# Patient Record
Sex: Female | Born: 1985 | Race: Black or African American | Hispanic: No | Marital: Single | Smoking: Never smoker
Health system: Southern US, Community
[De-identification: ages and names within clinical notes are randomized; demographics above are authoritative.]

## PROBLEM LIST (undated history)

## (undated) DIAGNOSIS — U071 COVID-19: Secondary | ICD-10-CM

## (undated) DIAGNOSIS — G4733 Obstructive sleep apnea (adult) (pediatric): Secondary | ICD-10-CM

## (undated) DIAGNOSIS — I10 Essential (primary) hypertension: Secondary | ICD-10-CM

## (undated) DIAGNOSIS — G4730 Sleep apnea, unspecified: Secondary | ICD-10-CM

## (undated) HISTORY — DX: Obstructive sleep apnea (adult) (pediatric): G47.33

## (undated) HISTORY — DX: COVID-19: U07.1

---
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
## 2004-05-10 ENCOUNTER — Emergency Department (HOSPITAL_COMMUNITY): Admission: EM | Admit: 2004-05-10 | Discharge: 2004-05-10 | Payer: Self-pay | Admitting: Emergency Medicine

## 2004-08-23 ENCOUNTER — Emergency Department (HOSPITAL_COMMUNITY): Admission: EM | Admit: 2004-08-23 | Discharge: 2004-08-23 | Payer: Self-pay | Admitting: Emergency Medicine

## 2005-02-06 ENCOUNTER — Emergency Department (HOSPITAL_COMMUNITY): Admission: EM | Admit: 2005-02-06 | Discharge: 2005-02-06 | Payer: Self-pay | Admitting: Family Medicine

## 2010-11-20 ENCOUNTER — Ambulatory Visit: Payer: Self-pay | Admitting: Gynecology

## 2010-12-03 ENCOUNTER — Other Ambulatory Visit: Payer: Self-pay | Admitting: Gynecology

## 2010-12-03 ENCOUNTER — Other Ambulatory Visit (HOSPITAL_COMMUNITY)
Admission: RE | Admit: 2010-12-03 | Discharge: 2010-12-03 | Disposition: A | Discharge status: Home / Self Care | Payer: 59 | Source: Ambulatory Visit | Attending: Gynecology | Admitting: Gynecology

## 2010-12-03 ENCOUNTER — Encounter: Payer: Self-pay | Admitting: Anesthesiology

## 2010-12-03 ENCOUNTER — Ambulatory Visit (INDEPENDENT_AMBULATORY_CARE_PROVIDER_SITE_OTHER): Payer: 59 | Admitting: Gynecology

## 2010-12-03 VITALS — BP 124/76 | Ht 63.5 in | Wt 226.0 lb

## 2010-12-03 DIAGNOSIS — Z01419 Encounter for gynecological examination (general) (routine) without abnormal findings: Secondary | ICD-10-CM

## 2010-12-03 DIAGNOSIS — Z13220 Encounter for screening for lipoid disorders: Secondary | ICD-10-CM

## 2010-12-03 DIAGNOSIS — Z833 Family history of diabetes mellitus: Secondary | ICD-10-CM

## 2010-12-03 DIAGNOSIS — E669 Obesity, unspecified: Secondary | ICD-10-CM

## 2010-12-03 DIAGNOSIS — R635 Abnormal weight gain: Secondary | ICD-10-CM

## 2010-12-03 DIAGNOSIS — R82998 Other abnormal findings in urine: Secondary | ICD-10-CM

## 2010-12-03 MED ORDER — PHENTERMINE HCL 37.5 MG PO CAPS: 37.5000 mg | ORAL_CAPSULE | ORAL | Status: DC

## 2010-12-03 MED ORDER — NORETHIN ACE-ETH ESTRAD-FE 1-20 MG-MCG PO TABS: 1.0000 | ORAL_TABLET | Freq: Every day | ORAL | Status: DC

## 2010-12-03 NOTE — Patient Instructions (Signed)
Dietary therapy for obesity  All topics are updated as new evidence becomes available and our peer review process is complete.  Literature review current through: Jun 2012.  This topic last updated: Apr 06, 2010.  INTRODUCTION - The optimal management of overweight and obesity requires a combination of diet, exercise, and behavioral modification. In addition, some patients eventually require pharmacologic therapy or bariatric surgery. The risk of overweight to the subject should be evaluated before beginning any treatment program. Selection of treatment can then be made using a risk-benefit assessment). The choice of therapy is dependent on several factors including the degree of overweight or obesity and patient preference.  This topic will review the dietary therapy of obesity. Other aspects of treatment are discussed separately. (See "Health hazards associated with obesity in adults" and "Overview of therapy for obesity in adults" and "Drug therapy of obesity" and "Behavioral strategies in the treatment of obesity".) GOALS OF WEIGHT LOSS - It is important to set goals when discussing a dietary weight loss program with an individual patient. An initial weight loss goal of 5 to 7 percent of body weight is realistic for most individuals. The first goal for any overweight individual is to prevent further weight gain and keep body weight stable (within 5 pounds of its current level).  The goal of the clinician is to identify and review with the patient a realistic weight-loss goal. Most patients have a weight loss goal of 30 percent or more below current weight, which is unrealistic [1].  A successful program will lead to a weight loss of more than 5 percent of initial weight [2]. A weight loss of more than 5 percent can reduce risk factors for cardiovascular disease, such as dyslipidemia, hypertension, and diabetes mellitus [3]. In the Diabetes Prevention Program, a multi-center trial in patients with  impaired glucose tolerance, weight loss of 7 percent reduced the rate of progression from impaired glucose tolerance to diabetes by 58 percent [4]. (See "Prediction and prevention of type 2 diabetes mellitus", section on 'Diabetes Prevention Program'.)  Loss of 5 percent of initial body weight and maintenance of this loss is a good medical result, even if the subject does not reach his or her "dream" weight.  Although an extremely difficult goal to achieve, a body mass index (BMI) between 20 and 25 kg/m2 puts the subject in the lowest risk category (table 1 and figure 1). DIETARY ENERGY Rate of weight loss - The rate of weight loss is directly related to the difference between the subject's energy intake and energy requirements. Reducing caloric intake below expenditure results in a predictable initial rate of weight loss that is related to the energy deficit [5,6]. However, prediction of weight loss for an individual subject can be difficult because of marked intersubject variability in initial body composition, adherence, and energy expenditure [5,7]. Food records are often inaccurate. Most normal-weight people under-report what they eat by 10 to 30 percent, while overweight people under-report by 30 percent or more [8]. In addition, energy requirements are influenced by fidgeting, gender, age, and genetic factors [5,6,9]. As examples: Men lose more weight than women of similar height and weight when they comply with eating any given diet because men have more lean body mass, less percent body fat, and therefore higher energy expenditure.  Older subjects of either sex have a lower energy expenditure and therefore lose weight more slowly than younger subjects; metabolic rate declines by approximately 2 percent per decade (about 100 kcal/decade) [10].  The importance  of genetic factors is illustrated by a study of identical female twin pairs who were overfed to induce weight gain [11]. Twelve twin pairs were  overfed by 1000 kcal/day for 84 of 100 days. The degree of weight gain at a constant dietary caloric increment varied widely among the twin pairs (from 4.3 to 13.3 kg), in fact, there was three times the variance for both weight and fat mass among the twin pairs when compared with that within the twin pairs. Approximately 22 kcal/kg is required to maintain a kilogram of body weight in a normal adult. Thus, the expected or calculated energy expenditure for a woman weighing 100 kg is approximately 2200 kcal/day. The variability of 20 percent could give energy needs as high as 2620 kcal/day or as low as 1860 kcal/day. An average deficit of 500 kcal/day should result in an initial weight loss of approximately 0.5 kg/week (1 lb/week). However, after three to six months of weight loss, energy expenditure adaptations occur, which slow the bodyweight response to a given change in energy intake, thereby diminishing ongoing weight loss [7]. There are several methods of formally estimating energy expenditure; we suggest using the WHO criteria (table 2). This method allows a direct estimate of resting metabolic rate (RMR) and calculation of daily energy requirement. The low activity level (1.3 x RMR) includes subjects who lead a sedentary life. The high activity level (1.7 x RMR) applies to those in jobs requiring manual labor or patients with regular daily physical exercise programs [12]. Maintenance of weight loss - It is important for the overweight subject to understand that achieving and maintaining weight loss is made difficult by the reduction in energy expenditure that is induced by weight loss (figure 2) [13]. Weight loss maintenance is also difficult because of changes in the peripheral hormone signals that regulate appetite. Gastrointestinal peptides, such as ghrelin, which stimulates appetite, and gastric inhibitory polypeptide, which may promote energy storage, increase after diet-induced weight loss. Other  circulating mediators that inhibit intake (eg, leptin, peptide YY, cholecystokinin, pancreatic polypeptide) decrease. These hormonal adaptations favoring weight gain persist for at least one year after diet-induced weight loss [14]. (See "Overview of therapy for obesity in adults", section on 'Maintenance of weight loss' and "Pathogenesis of obesity", section on 'Ghrelin'.)  TYPES OF DIETS - The general consensus is that excess intake of calories from any source, associated with a sedentary lifestyle, causes weight gain and obesity. The goal of dietary therapy, therefore, is to decrease energy intake from food. Conventional diets are defined as those below energy requirements but above 800 kcal/day [15]. These diets fall into four groups: Balanced low-calorie diets/portion-controlled diets  Low-fat diets  Low-carbohydrate diets  Mediterranean diet  Fad diets (diets involving unusual combinations of foods or eating sequences) Commercial weight loss programs and internet-based programs are discussed elsewhere. (See "Behavioral strategies in the treatment of obesity".) Balanced low-calorie diets - Planning a diet requires the selection of a caloric intake and then selection of foods to meet this intake. It is desirable to eat foods with adequate nutrients in addition to protein, carbohydrate, and essential fatty acids. Thus, weight-reducing diets should eliminate alcohol, sugar-containing beverages, and most highly concentrated sweets because they rarely contain adequate amounts of other nutrients besides energy. Breakdown of some protein is to be expected during weight loss. When weight increases as a result of overeating, approximately 75 percent of the extra energy is stored as fat and the remaining 25 percent as lean tissue. If the lean tissue contains 20  percent protein, then 5 percent of the extra weight gain would be protein. Thus, it should be anticipated that during weight loss, at least 5 percent of  weight loss will be protein. A desirable feature of any calorie restricted diet, however, is that it results in the lowest possible loss of protein, recognizing that this will not be less than 5 percent of the weight that is lost. Portion-controlled diets - One simple approach to providing a calorie-controlled diet is to use individually packaged foods, such as formula diet drinks using powdered or liquid formula diets, nutrition bars, frozen food, and pre-packaged meals that can be stored at room temperature as the main source of nutrients. Frozen low-calorie meals containing 250 to 350 kcal/package can be a convenient and nutritious way to do this. We have often recommended the use of formula diets or breakfast bars for breakfast, formula diets or a frozen lunch entree for lunch, and a frozen calorie-controlled entree with additional vegetables for dinner. In this way, it is possible to obtain a calorie-controlled 1000 to 1500 kcal per day diet. In one four-year study this approach resulted in early initial weight loss, which then was maintained [16]. I do not recommend the use of formula diets alone because they do not provide adequate nutritional variety. Low-fat diets - Low-fat diets are another standard strategy to help patients lose weight, and almost all dietary guidelines recommend a reduction in the daily intake of fat to 30 percent of energy intake or less [17,18]. In a meta-analysis of trials comparing low-fat diets (typically 20 to 25 percent of energy from fats) with a control group consuming a usual diet or a medium fat diet (usually 35 to 40 percent of energy), there was greater weight loss (approximately 3 kg) with low-fat compared with moderate fat diets [19]. In addition, one report noted that people who successfully keep their weight reduced adopt three strategies, one of which is eating a lower fat diet [20]. (See "Dietary fat" and "Etiology and natural history of obesity", section on  'Dietary habits'.) A low-fat dietary pattern with healthy carbohydrates is not associated with weight gain. This was illustrated by the [place] Dietary Modification Trial of [phone] postmenopausal women over age 10 years who were randomly assigned to a dietary intervention that included group and individual sessions to promote a decrease in fat intake and increases in fruit, vegetable, and grain consumption (healthy carbohydrates), but did not include weight loss or caloric restriction goals, or a control group which received only dietary educational materials [21]. After an average of 7.5 years of follow-up, the following results were seen: Women in the intervention group lost weight in the first year (mean of 2.2 kg) and maintained lower weight than the control women at 7.5 years (difference of 1.9 kg at one year, and 0.4 kg at 7.5 years).  No tendency toward weight gain was seen in the intervention group overall, or when stratified by age, ethnicity, or body mass index.  Weight loss was related to the level of fat intake and was greatest in women who decreased their percentage of energy from fat the most. A similar, but lesser trend was seen with increased vegetable and fruit intake. A low-fat diet can be implemented in two ways. First, the dietitian can provide the subject with specific menu plans that emphasize the use of reduced fat foods. As one guideline, if a food "melts" in your mouth, it probably has fat in it. Second, subjects can be instructed in counting  fat grams as an alternative to counting calories. Fat has 9.4 kcal/g. It is thus very easy to calculate the number of grams of fat a subject can eat for any given level of energy intake. Many experts recommend keeping calories from fat to below 30 percent of total calories. In practical terms, this means eating about 33 g of fat for each 1000 calories in the diet. For simplicity, I use 30 g of fat or less for each 1000 kcal. For  a 1500-calorie diet, this would mean about 45 g or less of fat, which can be counted using the nutrition information labels on food packages. Low-carbohydrate diets - Proponents of low-carbohydrate diets have argued that the increasing obesity epidemic may be in part due to low-fat, high-carbohydrate diets. But this may be dependent upon the type of carbohydrates that are eaten, such as energy dense snacks and sugar or high fructose containing beverages. The carbohydrate content of the diet is an important determinant of short-term (less than two weeks) weight loss. Low (60 to 130 grams of carbohydrates) and very low-carbohydrate diets (0 to <60 grams) have been popular for many years [15]. Restriction of carbohydrates leads to glycogen mobilization and, if carbohydrate intake is less than 50 g/day, ketosis will develop. Rapid weight loss occurs, primarily due to glycogen breakdown and fluid loss rather than fat loss. Low and very low-carbohydrate diets are more effective for short-term weight loss than low-fat diets, although probably not for long-term weight loss. A meta-analysis of five trials found that the difference in weight loss at six months, favoring the low carbohydrate over low fat diet, was not sustained at 12 months [22]. (See 'Comparison trials' below.) Low-carbohydrate diets may have some other beneficial effects with regard to risk of developing type 2 diabetes mellitus, coronary heart disease, and some cancers, particularly if attention is paid to the type as well as the quantity of carbohydrate. A low-carbohydrate diet can be implemented in two ways, either by reducing the total amount of carbohydrate or by consuming foods with a lower glycemic index or glycemic load (table 3). Glycemic index and load are reviewed separately. (See "Dietary carbohydrates", section on 'Glycemic index'.) If a low-carbohydrate diet is chosen, healthy choices for fat (mono- and polyunsaturated fats) and protein  (fish, nuts, legumes, and poultry) should be encouraged because of the association between saturated fat intake and risk of coronary heart disease. During 26 years of follow-up of women in the Nurses' Health Study and 20 years of follow-up of men in the [employer] Professionals' Follow-up Study, low carbohydrate diets in the highest versus lowest decile for vegetable proteins and fat were associated with lower all-cause mortality (HR 0.80, 95% CI 0.75-0.85) and cardiovascular mortality (HR 0.77, 95% CI 0.68-0.87) [23]. In contrast, low carbohydrate diets in the highest versus lowest decile for animal protein and fat were associated with higher all-cause (HR 1.23, 95% CI 1.11-1.37) and cardiovascular (HR 1.14, 95% CI 1.01-1.29) mortality. (See "Dietary fat" and "Overview of primary prevention of coronary heart disease and stroke", section on 'Healthy diet'.) High protein diets - Some popular books recommend high protein diets [24]. In one trial, low-fat diets with 12 percent and 25 percent protein content were compared. Weight loss over six months was greater with the higher protein diet (9 versus 5 kg), but the difference was no longer significant at 12 and 24 months [25]. Higher protein diets may improve weight maintenance, as illustrated by the results of a study of 60 subjects randomly assigned to a  low fat, high protein versus low-fat, high-carbohydrate diet after completing a four week very low calorie diet [26]. Among the subjects who completed the three-month study (n = 48), the high protein diet group had significantly better weight maintenance (between group difference of 2.3 kg). High dietary protein intake, due to its acid-producing load, increases urinary calcium excretion (with potential risk for bone loss and calcium stone formation) [27]. Urinary calcium excretion does appear to increase when dietary intake of protein increases [27-29], and this could pose a long-term risk for nephrolithiasis. (See  "Risk factors for calcium stones in adults", section on 'Dietary risk factors'.) However, two small randomized trials that looked at bone metabolism found evidence that increased dietary protein may decrease bone resorption [28,29]. One of the trials found that increased intestinal absorption of calcium was primarily responsible for the increased urinary excretion of calcium and that the excreted calcium was not coming from bone [29]. Mediterranean diet - The term Mediterranean diet refers to a dietary pattern that is common in [place] areas of the [place] area. Although there is some variation in Mediterranean diets, there are some common components that include a high level of monounsaturated fat relative to saturated; moderate consumption of alcohol, mainly as wine; a high consumption of vegetables, fruits, legumes, and grains; a moderate consumption of milk and dairy products, mostly in the form of cheese; and a relatively low intake of meat and meat products. A meta-analysis of 12 studies involving eight cohorts found that a Mediterranean diet was associated with improved health status and reductions in overall mortality, cardiovascular mortality, cancer mortality, and incidence of Parkinson's disease and Alzheimer's disease [30]. (See "Healthy diet in adults", section on 'Mediterranean diet'.) Very low-calorie diets - Diets with energy levels between 200 and 800 kcal/day are called "very low-calorie diets," while those below 200 kcal/day can be termed starvation diets. The basis for these diets was the notion that the lower the calorie intake the more rapid the weight loss, because the energy withdrawn from body fat stores is a function of the energy deficit. Starvation is the ultimate very low-calorie diet and results in the most rapid weight loss. Although once popular, starvation diets are now rarely used for treatment of obesity. Very low-calorie diets have not been shown to be superior to  conventional diets for long-term weight loss. In a meta-analysis of six trials comparing very low-calorie diets with conventional low-calorie diets, short-term weight loss was greater with very low-calorie diets (16.1 versus 9.7 versus percent of initial weight), but there was no difference in long-term weight loss (6.3 versus 5.0 percent) [31]. As with all diets, very low-calorie diets initially result in substantial protein loss that diminishes with time. Other expected effects include reduction in blood pressure and improvement in hyperglycemia in diabetic patients. Subjects adhering to very low-calorie diets usually have a fall in blood pressure, especially during the first week. Antihypertensive drugs, especially calcium channel blockers and diuretics, should usually be discontinued when a very low calorie diet is begun unless moderate to severe hypertension is present.  Most diabetic patients eating very low-calorie diets have marked improvement in hyperglycemia. Blood glucose concentrations fall within the first one to two weeks, and remain lower as long as the diet is continued. Those patients taking less than 50 units of insulin or an oral hypoglycemic drug will usually be able to discontinue therapy [32]. The side effects of very low-calorie diets include hair loss, thinning of the skin, and coldness. These diets are contraindicated for lactating  and pregnant women, and in children who require protein for linear growth. As with all diets, there is increased cholesterol mobilization from peripheral fat stores, thus increasing the risk of gallstones. Very low-calorie diets should be reserved for subjects who require rapid weight loss for a specific purpose, such as surgery. The weight regain when the diet is stopped is often rapid, and it is better to take a more sustainable approach than to use a method that cannot be sustained. Comparison trials - The impact of specific dietary composition on weight  change remains uncertain. When energy from dietary carbohydrates decreases, energy from fat sources tends to increase. The reverse is also true; when energy from dietary fats decreases, energy from carbohydrate sources tends to increase. The debate has mainly centered on whether low-fat or low-carbohydrate diets can better induce weight loss and sustain it over the long-term. Weight loss diets - Initial trials evaluating the effect of type of diet (predominantly low-carbohydrate versus low-fat) on weight loss and other outcomes showed that weight loss at six months was approximately 4 kg greater in the very low-carbohydrate group than in the low-fat group [33-35]. Trials lasting for one year, however, did not find a significant difference in weight loss [34,36,37]. A meta-analysis of five trials (including one study not referenced above) found that the difference in weight loss at six months, favoring the low carbohydrate over low fat diet, was not sustained at 12 months [22]. In one study, this convergence was mainly due to regain of weight in the low-carbohydrate group [34]; in another, the convergence was due to ongoing weight loss in the low-fat group (figure 3) [36]. Some of these initial comparison trials of different dietary regimens had important limitations [22]. These included high dropout rates (21 to 48 percent), suboptimal dietary compliance, and limited long-term follow-up. Subsequent trials are larger, of longer duration (lasting one to two years), and have conflicting results with regard to the impact of macronutrient composition on weight loss [38-41]. In contrast, all trials found that dietary adherence is an important determinant of weight loss, independent of macronutrient composition. The following observations illustrate the range of findings in these trials: In one trial, 322 moderately obese subjects (86 percent men) were randomly assigned to a low-fat (restricted calorie), Mediterranean  (moderate-fat, restricted calorie, rich in vegetables, low in red meat), or low-carbohydrate (non-restricted-calorie) diet for two years [38]. Adherence rates were higher than those reported in previous trials (95.4 and 84.6 percent at one and two years, respectively). Weight loss was greater with the Mediterranean and low-carbohydrate diets than the low-fat diet (mean weight loss 4.4, 4.7, and 2.9 kg, respectively).  The most favorable effect on lipids (increased HDL and decreased triglycerides and ratio of total cholesterol to HDL) was seen in the low-carbohydrate group. Among subjects with type 2 diabetes, the greatest improvement in glycemic control occurred with the Mediterranean diet. Among all groups, weight loss was greater for those who completed the two year study than for those who withdrew.  Another randomized trial compared four different diets in 311 overweight and obese premenopausal women: very low-carbohydrate (Atkins); macronutrient balance controlling glycemic load (Zone); general calorie restriction, low-fat (LEARN); and very low-fat (Ornish) [39]. In the intention-to-treat analysis at one year, mean weight loss was greater in the Atkins diet group compared with the other groups (4.7, 1.6, 2.2, and 2.6 kg, respectively). Pairwise comparisons showed a significant difference only for Atkins versus Zone.  The most favorable effect on triglycerides and HDL-C was seen in the Atkins  group. Dietary adherence rates (77 to 88 percent) were similar among the groups and better than in previous trials. Within each group, adherence was significantly associated with weight loss [42].  In the largest trial to date, 811 overweight and obese adults were randomly assigned to one of four diets based upon macronutrient content: low or high fat (20 to 40 percent), which provided carbohydrate at 35, 45, 55, or 65 percent, and high or average protein (15 to 25 percent) [40]. After six months, mean weight loss in  each group was 6 kg. By two years, mean weight loss was 3 to 4 kg, and weight losses remained similar in all groups. Many participants had trouble attaining target levels of macronutrients. Subjects who attended the greatest number of group sessions (most adherent) lost the most weight. Thus, any diet that is adhered to will produce modest weight loss, but adherence rates are low with most diets. Although a low-carbohydrate diet may be associated with greater short-term weight loss, superior weight loss in the long-term has not been established. The optimal mix of macronutrients likely depends upon individual factors [43]. A principal determinant of weight loss appears to be the degree of adherence to the diet, irrespective of the particular macronutrient composition [37,39,40,42,44,45]. Thus, we suggest choosing a macronutrient mix based upon patient preferences, which may improve long-term adherence. Behavioral modification to improve dietary compliance with any type of diet may have the greatest impact on long-term weight loss. (See "Behavioral strategies in the treatment of obesity".) Lipids - The observed effects on blood lipids were similar for trials comparing low fat and very low carbohydrate diets [22,33-36,46]; the low-carbohydrate/high-fat diets caused slight increases in HDL, and greater decreases in fasting triglycerides. At 12 to 24 months, however, the favorable effects on HDL persisted [34,36,37,41], while triglyceride levels were either reduced [34,36] or returned to baseline [37]. In a meta-analysis of trials comparing low-carbohydrate and low-fat diet groups, LDL levels were increased in the low-carbohydrate group [22]. There was no clear benefit of either low-fat or low-carbohydrate diet on cardiovascular risks. Favorable changes in HDL cholesterol and triglycerides should be weighed against potential unfavorable changes in LDL cholesterol.  Side effects - Very low-carbohydrate diets may be  associated with more frequent side effects than low-fat diets. In one of the trials noted above, a number of symptoms occurred significantly more frequently in the low-carbohydrate compared to the low-fat diet group [33]. These included constipation (68 versus 35 percent), headache (60 versus 40 percent), halitosis (38 versus 8 percent), muscle cramps (35 versus 7 percent), diarrhea (23 versus 7 percent), general weakness (25 versus 8 percent), and rash (13 versus 0 percent) [33]. Despite the higher rate of symptoms, dropout rates in clinical trials have been similar for low-carbohydrate and low-fat diets [34-36]. Some have raised the concern about ketosis that occurs with very low-carbohydrate diets. There is one case report of an obese patient who presented in severe ketoacidosis, having lost 9 kg in one month on the Atkins diet, with intake restricted to meat, cheese, and salads [47]. Aside from her diet and possible mild dehydration due to gastroenteritis, no other cause for her ketoacidosis was identified. Weight maintenance diets - Although many individuals have success losing weight with diet, most subsequently regain much or all of the lost weight. Maintaining weight loss is made difficult by the reduction in energy expenditure that is induced by weight loss. In addition, long-term adherence to restrictive diets is difficult. Exercise and behavioral interventions may help individuals maintain weight loss.  These strategies are reviewed in detail elsewhere. (See "Role of physical activity and exercise in obesity", section on 'Maintenance of weight loss' and "Behavioral strategies in the treatment of obesity", section on 'Maintenance of weight loss'.) There is little consensus on the optimal mix of macronutrients to maintain weight loss. The satiating effects of high protein, low glycemic index diets have generated interest in manipulating protein composition and glycemic index in weight maintenance diets.  (See 'High protein diets' above and "Dietary carbohydrates", section on 'Effect of glycemic index/glycemic load'.) In a multicenter trial of five ad libitum diets to prevent weight regain over 26 weeks, 773 adults who had successfully lost 8 percent of their body weight on a low calorie diet (800 to 1000 kcal/day), were randomly assigned in a two-by-two factorial design to a high or low-protein (25 versus 13 percent of total calories), high or low-glycemic index, or to a control diet (moderate protein content) [48]. All diets had a moderate fat content (25 to 30 percent). The achieved protein content was 5 percentage points higher in the high versus low protein groups, and the mean glycemic index was five units lower in the low-glycemic versus high-glycemic index groups. In the intention-to-treat analysis, weight regain during the trial was modestly but significantly greater in the low versus high-protein groups (mean difference 0.93 kg) and in the high versus low-glycemic index groups (mean difference 0.95 kg). Only subjects in the high-protein, low-glycemic index diet group continued to lose weight (mean change -0.38 kg). The trial was limited by the moderate dropout rate (29 percent) and short-term follow-up (six months). Whether a low glycemic index, high protein diet is associated with long-term weight maintenance is unknown. As discussed above, long-term adherence to a weight maintaining diet is probably the most important determinant of success, and therefore the optimal weight maintaining diet will depend upon preference and individual factors. Role of dietary counseling - Dietary counseling may produce modest, short-term weight losses. This topic is reviewed in detail elsewhere. (See "Behavioral strategies in the treatment of obesity", section on 'Elements of behavioral strategies' and "Behavioral strategies in the treatment of obesity", section on 'Efficacy'.)  Prolonged caloric restriction and  longevity - Prolonged caloric restriction improves longevity in rodents and non-human primates [49], but it is not known if the same is true in humans. It is hypothesized that the antiaging effects of caloric restriction are due to reduced energy expenditure resulting in a reduction in production of reactive oxygen species (and therefore a reduction in oxidative damage). In addition, other metabolic effects associated with caloric restriction, such as improved insulin sensitivity, might also have an antiaging effect. In one trial of 48 sedentary, overweight men and women, six months of caloric restriction, with or without exercise, resulted in significant weight loss as expected [50]. In addition, calorie restriction-mediated reductions in fasting insulin concentrations, core body temperature, serum T3 levels, and oxidative damage to DNA (as reflected by a reduction in DNA fragmentation) were seen, suggesting a possible antiaging effect of the prolonged caloric restriction. INFORMATION FOR PATIENTS - UpToDate offers two types of patient education materials, "The Basics" and "Beyond the Basics." The Basics patient education pieces are written in plain language, at the 5th to 6th grade reading level, and they answer the four or five key questions a patient might have about a given condition. These articles are best for patients who want a general overview and who prefer short, easy-to-read materials. Beyond the Basics patient education pieces are longer, more sophisticated, and more detailed. These  articles are written at the 10th to 12th grade reading level and are best for patients who want in-depth information and are comfortable with some medical jargon. Here are the patient education articles that are relevant to this topic. We encourage you to print or e-mail these topics to your patients. (You can also locate patient education articles on a variety of subjects by searching on "patient info" and the keyword(s)  of interest.)  Basics topics (see "Patient information: Diet and health (The Basics)" and "Patient information: Weight loss treatments (The Basics)")  Beyond the Basics topics (see "Patient information: Diet and health (Beyond the Basics)" and "Patient information: Weight loss treatments (Beyond the Basics)" and "Patient information: Weight loss surgery (Beyond the Basics)")  SUMMARY AND RECOMMENDATIONS An initial weight loss goal of 5 to 7 percent of body weight is realistic for most individuals. (See 'Goals of weight loss' above.)  Many types of diets produce modest weight loss. Options include balanced low-calorie, low-fat low-calorie, moderate-fat low calorie, low-carbohydrate diets, and the Mediterranean diet. Dietary adherence is an important predictor of weight loss, irrespective of the type of diet. (See 'Types of diets' above.)  We suggest tailoring a diet that reduces energy intake below energy expenditure to individual patient preferences, rather than focusing on the macronutrient composition of the diet (Grade 2B). (See 'Comparison trials' above.)  If a low-carbohydrate diet is chosen, healthy choices for fat (mono and polyunsaturated) and protein (fish, nuts, legumes, and poultry) should be encouraged. If a low-fat diet is chosen, the decrease in fat should be accompanied by increases in healthy carbohydrates (fruits, vegetables, whole grains).  Phentermine: Patient drug information  What key warnings do I need to know about before using this drug?  . ZOX:WRUE:A540:[id number] Primary pulmonary hypertension, a rare and unsafe lung disease, has happened in patients who got phentermine along with fenfluramine or dexfenfluramine. Phentermine may cause this lung disease.  . WGN:FAOZ:H086:V7846962 This drug may be habit-forming; avoid long-term use. Tell your doctor if you have a history of drug or alcohol abuse. This drug may cause unsafe heart-related side effects. Tell your doctor if you have any  heart disease.  . XBM:WUXL:K440:N0272536 Sometimes drugs are not safe when you take them with other drugs. They can cause bad side effects. This is one of those drugs. Be sure to talk to your doctor about all the drugs you take. When is it not safe to use this drug?  . UYQ:IHKV:Q259:D6387564 If you have an allergy to phentermine or any other part of this drug.  . PPI:RJJO:A416:S0630160 Tell your doctor if you are allergic to any drugs. Make sure to tell about the allergy and what signs you had. This includes telling about rash; hives; itching; shortness of breath; wheezing; cough; swelling of face, lips, tongue, or throat; or any other signs.  . FUX:NATF:T732:K0254270 If you have any of these health problems: Both attention deficit/hyperactivity problems and Tourette's syndrome or tics, high blood sugar (diabetes), drug abuse, glaucoma, heart disease, high blood pressure, nervous state, overactive thyroid disease, or structure problems of the heart.  . WCB:JSEG:B151:V6160737 If you have taken isocarboxazid, phenelzine, or tranylcypromine in the last 14 days. Monoamine oxidase inhibitors (eg, isocarboxazid, phenelzine, and tranylcypromine) must be stopped 14 days before this drug is started. Taking both at the same time could cause risky high blood pressure.  . TGG:YIRS:W546:E7035009 If you are pregnant or may be pregnant.  . FGH:WEXH:B716:R6789381 If you are breast-feeding. What is this drug used for?  . OFB:PZWC:H852:D7824235 It is used  to treat obesity. How does this drug work?  . JXB:JYNW:G956:O1308657 Phentermine acts in the brain to help you eat less. How is this drug best taken?  . QIO:NGEX:B284:X3244010 Take early in the day to stop sleep problems.  . UVO:ZDGU:Y403:K7425956 Follow the diet and workout plan that your doctor told you about.  LOV:FIEP:P295:J8841660 Tablets and capsules:  . YTK:ZSWF:U932:T5573220 Take before breakfast or 1 to 2 hours after breakfast.  URK:YHCW:C376:E8315176  Oral-disintegrating tablet:  . HYW:VPXT:G626:R4854627 Take in the morning.  . OJJ:KKXF:G182:X9371696 Take with or without food.  . VEL:FYBO:F751:W2585277 Place on your tongue and let it melt. Water is not needed. Do not swallow it whole. Do not chew, break, or crush it. What do I do if I miss a dose? (does not apply to patients in the hospital)  . OEU:MPNT:I144:R1540086 Take a missed dose as soon as you think about it.  . PYP:PJKD:T267:T2458099 If it is close to the time for your next dose, skip the missed dose and go back to your normal time.  . IPJ:ASNK:N397:Q7341937 Do not take 2 doses at the same time or extra doses.  . TKW:IOXB:D532:D9242683 Do not change the dose or stop this drug. Talk with the doctor. Are there any precautions when using this drug?  . MHD:QQIW:L798:X2119417 Keep a list of all your drugs (prescription, natural products, vitamins, OTC) with you. Give this list to your doctor.  . EYC:XKGY:J856:D1497026 This drug may be habit-forming with long-term use.  . VZC:HYIF:O277:A1287867 Have your blood pressure and heart rate checked often if you have heart disease.  . EHM:CNOB:S962:E3662947 Check all drugs you are taking with your doctor. This drug may not mix well with some other drugs.  . MLY:YTKP:T465:K8127517 Limit your use of caffeine (for example, tea, coffee, cola) and chocolate. Use with this drug may cause nervousness, shakiness, and a fast heartbeat.  . GYF:VCBS:W967:R9163846 Use birth control that you can trust to stop pregnancy while taking this drug. What are some side effects of this drug?  . KZL:DJTT:S177:L3903009 Feeling dizzy. Rise slowly over a few minutes when sitting or lying down. Be careful climbing.  . QZR:AQTM:A263:F3545625 Nervous and excitable.  . WLS:LHTD:S287:G8115726 Dry mouth. Good mouth care, sucking hard, sugar-free candy, or chewing sugar-free gum may help. See a dentist often.  . OMB:TDHR:C163:A4536468 Not hungry.  . EHO:ZYYQ:M250:I3704888 Not able  to sleep. When do I need to call my doctor?  . BVQ:XIHW:T888:K8003491 If you think there was an overdose, call your local poison control center or ER right away.  . PHX:TAVW:P794:I0165537 Signs of a very bad reaction to the drug. These include wheezing; chest tightness; fever; itching; bad cough; blue skin color; seizures; or swelling of face, lips, tongue, or throat.  . SMO:LMBE:M754:G9201007 Very bad problems with how you act.  . HQR:FXJO:I325:Q9826415 Chest pain or pressure or a fast heartbeat.  . AXE:NMMH:W808:U1103159 Very bad dizziness or passing out.  . YVO:PFYT:W446:K8638177 Very nervous and excitable.  . NHA:FBXU:X833:X8329191 Very bad headache.  . YOM:AYOK:H997:F4142395 Any rash.  . VUY:EBXI:D568:S1683729 Side effect or health problem is not better or you are feeling worse. How do I store and/or throw out this drug?  . MSX:JDBZ:M080:E2336122 Store at room temperature.  . ESL:PNPY:Y511:M2111735 Protect from water. Do not store in a bathroom or kitchen. General drug facts  . APO:LIDC:V013:H4388875 If you have a very bad allergy, wear an allergy ID at all times.  . ZVJ:KQAS:U015:I1537943 Do not share your drugs with others and do not take anyone else's drugs.  . EXM:DYJW:L295:F4734037 Keep all drugs out of the  reach of children and pets.  . EAV:WUJW:J191:Y7829562 Most drugs may be thrown away in household trash after mixing with coffee grounds or kitty litter and sealing in a plastic bag.  . ZHY:QMVH:Q469:G2952841 In [place], take any unused drugs to the pharmacy. Also, visit http://www.[facility].gc.ca/hl-vs/iyh-vsv/med/disposal-defaire-eng.php#th to learn about the right way to get rid of unused drugs.  . LKG:MWNU:U725:D6644034 Keep a list of all your drugs (prescription, natural products, vitamins, OTC) with you. Give this list to your doctor.  . VQQ:VZDG:L875:I4332951 Call your doctor for help with any side effects. If in the [place] you may also call the FDA at 1-800-FDA-1088 or if in  [place], you may also call [employer]'s Vigilance Program at [phone].  . SWF:UXNA:T557:D2202542 Talk with the doctor before starting any new drug, including OTC, natural products, or vitamins.

## 2010-12-03 NOTE — Progress Notes (Signed)
Cassie Duncan [birth year] [id number]   History:    25 y.o.  for annual exam with her main complaint of being overweight despite attempts to exercise indeed healthy. She denies any prior abnormal Pap smears and does her monthly self breast examination. The patient also wanted discussed the guardasil vaccine. She was also interested in discussing contraception although she is currently not sexually active. She states that she does have heavy periods but they're irregular once a month.  Past medical history,surgical history, family history and social history were all reviewed and documented in the EPIC chart. ROS:  Was performed and pertinent positives and negatives are included in the history.  Exam: chaperone present Filed Vitals:   12/03/10 1411  BP: 124/76   Body mass index is 39.41 kg/(m^2).  General appearance : Well developed well nourished female. Skin grossly normal HEENT: Neck supple, trachea midline Lungs: Clear to auscultation, no rhonchi or wheezes Heart: Regular rate and rhythm, no murmurs or gallops Breast:Examined in sitting and supine position were symmetrical in appearance, no palpable masses, to skin retraction, no nipple inversion, no nipple discharge and no axillary or supraclavicular lymphadenopathy Abdomen: no palpable masses or tenderness Pelvic  Ext/BUS/vagina  normal   Cervix  normal   Uterus  difficult to examine due to patient's abdominal girth, as well as adnexa exam limited Anus and perineum  normal   Rectovaginal  deferred  Assessment/Plan:  25 y.o. female for annual exam limited pelvic exam due to patient's abdominal girth she currently weighs 226 pounds and has a BMI of 39.4 which placed her in the obese category I provided her with literature and information on appropriate nutrition. She is also interested on the phentermine to help her reduced weight and we had a lengthy discussion of the risks benefits and pros and cons to include valvular heart problems as  well as pulmonary hypertension. She fully accepts the risk and would like to go on a just for 3 months only will report to the office on monthly basis for blood pressure check pulmonary cardiac auscultation. Were also going to check her TSH as well as a random blood sugar because of family history diabetes along with her CBC urinalysis and Pap smear. Due to limited examination because of her obesity she'll return back next week for a pelvic ultrasound. She will be started on the Janel 14/20 oral contraceptive pill for which she will take continuous and withdrawal every 3 months to help her with her PMDD as well. Literature formation on the Guardasil vaccine was provided and she will let us know next week if she would like to receive the series when she returns to the office next week for her pelvic ultrasound   [facility] H MD, 3:12 PM 12/03/2010    In a

## 2011-01-08 ENCOUNTER — Ambulatory Visit: Payer: 59 | Admitting: Gynecology

## 2011-01-08 ENCOUNTER — Other Ambulatory Visit: Payer: 59

## 2011-01-25 ENCOUNTER — Other Ambulatory Visit: Payer: Self-pay | Admitting: Gynecology

## 2011-01-25 ENCOUNTER — Ambulatory Visit: Admission: RE | Admit: 2011-01-25 | Payer: 59 | Source: Ambulatory Visit

## 2011-01-25 ENCOUNTER — Ambulatory Visit (INDEPENDENT_AMBULATORY_CARE_PROVIDER_SITE_OTHER): Payer: 59 | Admitting: Gynecology

## 2011-01-25 DIAGNOSIS — E669 Obesity, unspecified: Secondary | ICD-10-CM

## 2011-01-25 DIAGNOSIS — N942 Vaginismus: Secondary | ICD-10-CM

## 2011-01-25 DIAGNOSIS — B3731 Acute candidiasis of vulva and vagina: Secondary | ICD-10-CM

## 2011-01-25 DIAGNOSIS — N898 Other specified noninflammatory disorders of vagina: Secondary | ICD-10-CM

## 2011-01-25 DIAGNOSIS — B373 Candidiasis of vulva and vagina: Secondary | ICD-10-CM

## 2011-01-25 DIAGNOSIS — Z113 Encounter for screening for infections with a predominantly sexual mode of transmission: Secondary | ICD-10-CM

## 2011-01-25 MED ORDER — FLUCONAZOLE 150 MG PO TABS: 150.0000 mg | ORAL_TABLET | Freq: Once | ORAL | Status: AC

## 2011-01-25 NOTE — Progress Notes (Signed)
Patient is a 25 year old who seen in the office on August 2 for her annual exam. Her biggest concern is her weight gain. Her weight was 226 pounds on that office visit with a BMI of 39.4. Due to the limited pelvic examination because of her weight and ultrasound was done today. The ultrasound demonstrated uterus to measure 7.5 x 4.5 x 3.9 cm with an endometrial stripe of 1.2 mm both ovaries appeared to be normal and her endometrial stripe of 1.2 mm.  Patient had been placed on a rigid diet and exercise program with a combination of phentermine 37.5 mg daily for 3 months time. Today on examination her lungs were clear to auscultation and rhonchi his or wheezes heart regular rate and rhythm no murmurs or gallop. She had been on medication for one month and denied any side effects and has lost 4 pounds. She is fully aware that this medication is potential risk for pulmonary hypertension and cardiac problems and she fully accepts.  Patient was complaining of vaginal discharge and states she has not been sexually active close to a year. A GC and Chlamydia culture wet prep was done today the wet prep demonstrated moniliasis and she was given a prescription of Diflucan 150 mg one by mouth today.  Patient will return back in one month for followup weight check and blood pressure as well as pulmonary and cardiac examination.

## 2011-02-24 ENCOUNTER — Ambulatory Visit: Payer: 59 | Admitting: Gynecology

## 2011-03-19 ENCOUNTER — Encounter: Payer: Self-pay | Admitting: Gynecology

## 2011-03-19 ENCOUNTER — Ambulatory Visit (INDEPENDENT_AMBULATORY_CARE_PROVIDER_SITE_OTHER): Payer: 59 | Admitting: Gynecology

## 2011-03-19 VITALS — BP 128/80 | Wt 226.0 lb

## 2011-03-19 DIAGNOSIS — E669 Obesity, unspecified: Secondary | ICD-10-CM | POA: Insufficient documentation

## 2011-03-19 HISTORY — DX: Obesity, unspecified: E66.9

## 2011-03-19 HISTORY — DX: Morbid (severe) obesity due to excess calories: E66.01

## 2011-03-19 NOTE — Progress Notes (Signed)
Patient 25 year old who presented to the office for followup visit. Patient was seen twice this year or so far her last encounter here in the office September 24. Patient is obese and had history of menorrhagia. She had been started on Junel 1/20 oral contraceptive pills. We also had an ultrasound because of her abdominal girth and incomplete pelvic examination, the ultrasound was otherwise normal. We also had a discussion on phentermine its risks benefits and pros and cons and potential risk of pulmonary hypertension cardiac defects. She wanted to go ahead and begin it for 3 months. It appears that she has an issue of compliance. She did take it regularly for the first month. But the second month she has had issues with not taking and on a regular basis. When she was here for her annual exam she had a normal TSH, blood sugar, cholesterol, CBC, urinalysis, and Pap smear.  Exam: Blood pressure 128/80 Lungs: Clear to auscultation Rogers or wheezes Heart: Regular rate and rhythm no murmurs or gallops Thyroid: No thyromegaly no carotid bruits.  We reinforced size today the importance of good nutrition and regular exercise program to help her reduce weight. She'll restart the phentermine in February and to followup with me in March. Review of her record indicated she was weighing 226 pounds in August when she returned September she was weighing 222 intubation back to 226.

## 2011-09-29 ENCOUNTER — Ambulatory Visit: Payer: 59 | Admitting: Gynecology

## 2011-09-29 ENCOUNTER — Other Ambulatory Visit: Payer: Self-pay | Admitting: *Deleted

## 2011-09-29 ENCOUNTER — Ambulatory Visit (INDEPENDENT_AMBULATORY_CARE_PROVIDER_SITE_OTHER): Payer: 59 | Admitting: Gynecology

## 2011-09-29 ENCOUNTER — Encounter: Payer: Self-pay | Admitting: Gynecology

## 2011-09-29 VITALS — BP 126/84 | Wt 237.0 lb

## 2011-09-29 DIAGNOSIS — E663 Overweight: Secondary | ICD-10-CM

## 2011-09-29 MED ORDER — PHENTERMINE HCL 37.5 MG PO CAPS: 37.5000 mg | ORAL_CAPSULE | ORAL | Status: DC

## 2011-09-29 NOTE — Progress Notes (Signed)
Patient is a 26 year old who was seen in the office on November 2012 complaining of her weight gain despite attempts with exercising bleeding. She was weighing 226 pounds and is currently weighing 237 pounds. She had been started on phentermine 37.5 mg to take 1 by mouth daily for three-month course accompanied by office visits monthly for cardiac auscultation and blood pressure readings and weight measurements. She only took the medication for one month and now would like to start again.  We also had a discussion on phentermine its risks benefits and pros and cons and potential risk of pulmonary hypertension cardiac defects. She wanted to go ahead and begin it for 3 months. It appears that she has an issue of compliance.  She's currently not sexually active and recently restarted her oral contraceptive pill the Junel 1/20. We discussed importance of compliance. We had a lengthy discussion of proper nutrition for which literature information was provided as well as on exercise.  Exam: HEENT unremarkable Neck: Supple trachea midline no carotid bruits Lungs: Clear to auscultation Rogers or wheezes Heart: Regular rate and rhythm no murmurs or gallops  Assessment/plan: Morbidly obese patient with a BMI of 41.32. Today's blood pressure 126/84. She will be referred to the [facility], nutrition center for nutritional guidance. We will place her on phentermine 37.5 mg daily for three-month period only. She is to return to the office on monthly basis for cardiac auscultation, blood pressure readings and weight measurements. We're also going to refer her to the general surgeon for possible consideration bariatric surgery. Patient fully wears all the above was discussed in excess risk all questions were answered and we'll follow accordingly.

## 2011-09-29 NOTE — Patient Instructions (Signed)
Cholesterol Control Diet  Cholesterol levels in your body are determined significantly by your diet. Cholesterol levels may also be related to heart disease. The following material helps to explain this relationship and discusses what you can do to help keep your heart healthy. Not all cholesterol is bad. Low-density lipoprotein (LDL) cholesterol is the "bad" cholesterol. It may cause fatty deposits to build up inside your arteries. High-density lipoprotein (HDL) cholesterol is "good." It helps to remove the "bad" LDL cholesterol from your blood. Cholesterol is a very important risk factor for heart disease. Other risk factors are high blood pressure, smoking, stress, heredity, and weight. The heart muscle gets its supply of blood through the coronary arteries. If your LDL cholesterol is high and your HDL cholesterol is low, you are at risk for having fatty deposits build up in your coronary arteries. This leaves less room through which blood can flow. Without sufficient blood and oxygen, the heart muscle cannot function properly and you may feel chest pains (angina pectoris). When a coronary artery closes up entirely, a part of the heart muscle may die, causing a heart attack (myocardial infarction). CHECKING CHOLESTEROL When your caregiver sends your blood to a lab to be analyzed for cholesterol, a complete lipid (fat) profile may be done. With this test, the total amount of cholesterol and levels of LDL and HDL are determined. Triglycerides are a type of fat that circulates in the blood and can also be used to determine heart disease risk. The list below describes what the numbers should be: Test: Total Cholesterol.  Less than 200 mg/dl.  Test: LDL "bad cholesterol."  Less than 100 mg/dl.   Less than 70 mg/dl if you are at very high risk of a heart attack or sudden cardiac death.  Test: HDL "good cholesterol."  Greater than 50 mg/dl for women.    Greater than 40 mg/dl for men.  Test: Triglycerides.  Less than 150 mg/dl.  CONTROLLING CHOLESTEROL WITH DIET Although exercise and lifestyle factors are important, your diet is key. That is because certain foods are known to raise cholesterol and others to lower it. The goal is to balance foods for their effect on cholesterol and more importantly, to replace saturated and trans fat with other types of fat, such as monounsaturated fat, polyunsaturated fat, and omega-3 fatty acids. On average, a person should consume no more than 15 to 17 g of saturated fat daily. Saturated and trans fats are considered "bad" fats, and they will raise LDL cholesterol. Saturated fats are primarily found in animal products such as meats, butter, and cream. However, that does not mean you need to sacrifice all your favorite foods. Today, there are good tasting, low-fat, low-cholesterol substitutes for most of the things you like to eat. Choose low-fat or nonfat alternatives. Choose round or loin cuts of red meat, since these types of cuts are lowest in fat and cholesterol. Chicken (without the skin), fish, veal, and ground turkey breast are excellent choices. Eliminate fatty meats, such as hot dogs and salami. Even shellfish have little or no saturated fat. Have a 3 oz (85 g) portion when you eat lean meat, poultry, or fish. Trans fats are also called "partially hydrogenated oils." They are oils that have been scientifically manipulated so that they are solid at room temperature resulting in a longer shelf life and improved taste and texture of foods in which they are added. Trans fats are found in stick margarine, some tub margarines, cookies, crackers, and baked goods.    When baking and cooking, oils are an excellent substitute for butter. The monounsaturated oils are especially beneficial since it is believed they lower LDL and raise HDL. The oils you should avoid entirely are saturated tropical oils, such as coconut and  palm.  Remember to eat liberally from food groups that are naturally free of saturated and trans fat, including fish, fruit, vegetables, beans, grains (barley, rice, couscous, bulgur wheat), and pasta (without cream sauces).  IDENTIFYING FOODS THAT LOWER CHOLESTEROL  Soluble fiber may lower your cholesterol. This type of fiber is found in fruits such as apples, vegetables such as broccoli, potatoes, and carrots, legumes such as beans, peas, and lentils, and grains such as barley. Foods fortified with plant sterols (phytosterol) may also lower cholesterol. You should eat at least 2 g per day of these foods for a cholesterol lowering effect.  Read package labels to identify low-saturated fats, trans fats free, and low-fat foods at the supermarket. Select cheeses that have only 2 to 3 g saturated fat per ounce. Use a heart-healthy tub margarine that is free of trans fats or partially hydrogenated oil. When buying baked goods (cookies, crackers), avoid partially hydrogenated oils. Breads and muffins should be made from whole grains (whole-wheat or whole oat flour, instead of "flour" or "enriched flour"). Buy non-creamy canned soups with reduced salt and no added fats.  FOOD PREPARATION TECHNIQUES  Never deep-fry. If you must fry, either stir-fry, which uses very little fat, or use non-stick cooking sprays. When possible, broil, bake, or roast meats, and steam vegetables. Instead of dressing vegetables with butter or margarine, use lemon and herbs, applesauce and cinnamon (for squash and sweet potatoes), nonfat yogurt, salsa, and low-fat dressings for salads.  LOW-SATURATED FAT / LOW-FAT FOOD SUBSTITUTES Meats / Saturated Fat (g)  Avoid: Steak, marbled (3 oz/85 g) / 11 g   Choose: Steak, lean (3 oz/85 g) / 4 g   Avoid: Hamburger (3 oz/85 g) / 7 g   Choose: Hamburger, lean (3 oz/85 g) / 5 g   Avoid: Ham (3 oz/85 g) / 6 g   Choose: Ham, lean cut (3 oz/85 g) / 2.4 g   Avoid: Chicken, with skin, dark  meat (3 oz/85 g) / 4 g   Choose: Chicken, skin removed, dark meat (3 oz/85 g) / 2 g   Avoid: Chicken, with skin, light meat (3 oz/85 g) / 2.5 g   Choose: Chicken, skin removed, light meat (3 oz/85 g) / 1 g  Dairy / Saturated Fat (g)  Avoid: Whole milk (1 cup) / 5 g   Choose: Low-fat milk, 2% (1 cup) / 3 g   Choose: Low-fat milk, 1% (1 cup) / 1.5 g   Choose: Skim milk (1 cup) / 0.3 g   Avoid: Hard cheese (1 oz/28 g) / 6 g   Choose: Skim milk cheese (1 oz/28 g) / 2 to 3 g   Avoid: Cottage cheese, 4% fat (1 cup) / 6.5 g   Choose: Low-fat cottage cheese, 1% fat (1 cup) / 1.5 g   Avoid: Ice cream (1 cup) / 9 g   Choose: Sherbet (1 cup) / 2.5 g   Choose: Nonfat frozen yogurt (1 cup) / 0.3 g   Choose: Frozen fruit bar / trace   Avoid: Whipped cream (1 tbs) / 3.5 g   Choose: Nondairy whipped topping (1 tbs) / 1 g  Condiments / Saturated Fat (g)  Avoid: Mayonnaise (1 tbs) / 2 g   Choose: Low-fat   mayonnaise (1 tbs) / 1 g   Avoid: Butter (1 tbs) / 7 g   Choose: Extra light margarine (1 tbs) / 1 g   Avoid: Coconut oil (1 tbs) / 11.8 g   Choose: Olive oil (1 tbs) / 1.8 g   Choose: Corn oil (1 tbs) / 1.7 g   Choose: Safflower oil (1 tbs) / 1.2 g   Choose: Sunflower oil (1 tbs) / 1.4 g   Choose: Soybean oil (1 tbs) / 2.4 g   Choose: Canola oil (1 tbs) / 1 g  Document Released: 04/19/2005 Document Revised: 12/30/2010 Document Reviewed: 10/08/2010 ExitCare Patient Information 2012 [employer].  Exercise to Lose Weight Exercise and a healthy diet may help you lose weight. Your doctor may suggest specific exercises. EXERCISE IDEAS AND TIPS  Choose low-cost things you enjoy doing, such as walking, bicycling, or exercising to workout videos.   Take stairs instead of the elevator.   Walk during your lunch break.   Park your car further away from work or school.   Go to a gym or an exercise class.   Start with 5 to 10 minutes of exercise each day. Build up to  30 minutes of exercise 4 to 6 days a week.   Wear shoes with good support and comfortable clothes.   Stretch before and after working out.   Work out until you breathe harder and your heart beats faster.   Drink extra water when you exercise.   Do not do so much that you hurt yourself, feel dizzy, or get very short of breath.  Exercises that burn about 150 calories:  Running 1  miles in 15 minutes.   Playing volleyball for 45 to 60 minutes.   Washing and waxing a car for 45 to 60 minutes.   Playing touch football for 45 minutes.   Walking 1  miles in 35 minutes.   Pushing a stroller 1  miles in 30 minutes.   Playing basketball for 30 minutes.   Raking leaves for 30 minutes.   Bicycling 5 miles in 30 minutes.   Walking 2 miles in 30 minutes.   Dancing for 30 minutes.   Shoveling snow for 15 minutes.   Swimming laps for 20 minutes.   Walking up stairs for 15 minutes.   Bicycling 4 miles in 15 minutes.   Gardening for 30 to 45 minutes.   Jumping rope for 15 minutes.   Washing windows or floors for 45 to 60 minutes.  Document Released: 05/22/2010 Document Revised: 12/30/2010 Document Reviewed: 05/22/2010 ExitCare Patient Information 2012 [employer].  

## 2011-10-04 ENCOUNTER — Telehealth: Payer: Self-pay | Admitting: *Deleted

## 2011-10-04 NOTE — Telephone Encounter (Signed)
Message copied by Libby Maw on Mon Oct 04, 2011 11:47 AM ------      Message from: Ok Edwards      Created: Wed Sep 29, 2011  1:59 PM       Thurley Francesconi, this patient needs to be referred to the nutritionist at [facility] for diet and calorie count counseling for morbid obesity.

## 2011-10-04 NOTE — Telephone Encounter (Signed)
Appt set up with [facility] on 10/28/11 @ 4:30pm.

## 2011-10-28 ENCOUNTER — Encounter: Payer: 59 | Attending: Gynecology | Admitting: *Deleted

## 2011-10-28 ENCOUNTER — Ambulatory Visit: Payer: 59 | Admitting: Gynecology

## 2011-10-28 ENCOUNTER — Encounter: Payer: Self-pay | Admitting: *Deleted

## 2011-10-28 VITALS — Ht 64.0 in | Wt 235.2 lb

## 2011-10-28 DIAGNOSIS — E669 Obesity, unspecified: Secondary | ICD-10-CM | POA: Insufficient documentation

## 2011-10-28 DIAGNOSIS — Z713 Dietary counseling and surveillance: Secondary | ICD-10-CM | POA: Insufficient documentation

## 2011-10-28 NOTE — Progress Notes (Signed)
  Medical Nutrition Therapy:  Appt start time: 1630 end time:  1730.  Assessment:  Primary concerns today: patient here for assistance with weight loss. She is physically active and appears to make good choices with her foods except for higher fat choices when eating out. 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 She lives alone except for this summer while her younger brother is living with her while in college.   MEDICATIONS: see list   DIETARY INTAKE:  Usual eating pattern includes 3 meals and 1-2 snacks per day.  Everyday foods include good variety of most food groups.  Avoided foods include bread and dairy red meat, pork.    24-hr recall:  B ( AM): oatmeal with craisins, 2 Truvias, occasional banana Or just banana before workout, then oats later, water to drink Snk ( AM): almond packet OR almond butter with sliced apples  L ( PM): prepare from home: Ezekial bread (no flour) with [place] mayo or Ranch dressing, or mustard, fresh fruit, water or Crystal Light Snk ( PM): rarely D ( PM): lean meat, starch, Steamer vegetables Snk ( PM): none unless cereal rarely Beverages: detox tea,   Usual physical activity: boot camp twice weekly, running group twice weekly, Zumba weekly  Estimated energy needs: 1400 calories 158 g carbohydrates 105 g protein 39 g fat  Progress Towards Goal(s):  In progress.   Nutritional Diagnosis:  NI-1.5 Excessive energy intake As related to activity level.  As evidenced by BMI of 40.5.    Intervention:  Nutrition counseling for weight management provided. Discussed calorie content of macro-nutrients and appropriate balance of each. Will need assistance with meal planning and shopping ideas. Suggested use of Calorie King APP for nutrient content of foods. Plan: Aim for 2-3 Carb Choices per meal (30-45 grams) Aim for 0-1 Carb Choice per snack if hungry Read food labels for total carb  and fat grams of foods Consider cooking ahead for the week to have lower calorie foods available  Consider using Rotisserie chicken and cubing it, saving in freezer for quick meals Consider using Meal Planner to plan meals and grocery lists  Handouts given during visit include: Carb Counting and Food Label handouts Meal Plan Card  Menu Planner handout  Monitoring/Evaluation:  Dietary intake, exercise, meal planning, and body weight prn.

## 2011-11-01 NOTE — Patient Instructions (Signed)
Plan: Aim for 2-3 Carb Choices per meal (30-45 grams) Aim for 0-1 Carb Choice per snack if hungry Read food labels for total carb and fat grams of foods Consider cooking ahead for the week to have lower calorie foods available  Consider using Rotisserie chicken and cubing it, saving in freezer for quick meals Consider using Meal Planner to plan meals and grocery lists

## 2011-11-03 ENCOUNTER — Ambulatory Visit: Payer: 59 | Admitting: Gynecology

## 2011-11-19 ENCOUNTER — Ambulatory Visit: Payer: 59 | Admitting: Gynecology

## 2012-02-01 ENCOUNTER — Other Ambulatory Visit: Payer: Self-pay | Admitting: *Deleted

## 2012-02-01 MED ORDER — NORETHIN ACE-ETH ESTRAD-FE 1-20 MG-MCG PO TABS: 1.0000 | ORAL_TABLET | Freq: Every day | ORAL | Status: DC

## 2012-02-16 ENCOUNTER — Encounter: Payer: 59 | Admitting: Gynecology

## 2012-02-23 ENCOUNTER — Ambulatory Visit (INDEPENDENT_AMBULATORY_CARE_PROVIDER_SITE_OTHER): Payer: 59 | Admitting: Gynecology

## 2012-02-23 ENCOUNTER — Encounter: Payer: Self-pay | Admitting: Gynecology

## 2012-02-23 VITALS — BP 124/80 | Ht 63.5 in | Wt 237.0 lb

## 2012-02-23 DIAGNOSIS — Z113 Encounter for screening for infections with a predominantly sexual mode of transmission: Secondary | ICD-10-CM

## 2012-02-23 DIAGNOSIS — Z01419 Encounter for gynecological examination (general) (routine) without abnormal findings: Secondary | ICD-10-CM

## 2012-02-23 DIAGNOSIS — N942 Vaginismus: Secondary | ICD-10-CM

## 2012-02-23 DIAGNOSIS — Z833 Family history of diabetes mellitus: Secondary | ICD-10-CM

## 2012-02-23 DIAGNOSIS — N898 Other specified noninflammatory disorders of vagina: Secondary | ICD-10-CM

## 2012-02-23 DIAGNOSIS — R635 Abnormal weight gain: Secondary | ICD-10-CM

## 2012-02-23 LAB — CBC WITH DIFFERENTIAL/PLATELET
Eosinophils Absolute: 0.1 10*3/uL (ref 0.0–0.7)
Eosinophils Relative: 1 % (ref 0–5)
HCT: 39.4 % (ref 36.0–46.0)
Lymphs Abs: 2.4 10*3/uL (ref 0.7–4.0)
MCH: 29.4 pg (ref 26.0–34.0)
MCV: 86.4 fL (ref 78.0–100.0)
Monocytes Absolute: 0.3 10*3/uL (ref 0.1–1.0)
Platelets: 365 10*3/uL (ref 150–400)
RBC: 4.56 MIL/uL (ref 3.87–5.11)

## 2012-02-23 LAB — LIPID PANEL
Cholesterol: 158 mg/dL (ref 0–200)
HDL: 44 mg/dL (ref >39)
Total CHOL/HDL Ratio: 3.6 Ratio
Triglycerides: 47 mg/dL (ref <150)
VLDL: 9 mg/dL (ref 0–40)

## 2012-02-23 LAB — WET PREP FOR TRICH, YEAST, CLUE: Trich, Wet Prep: NONE SEEN

## 2012-02-23 LAB — TSH: TSH: 1.159 u[IU]/mL (ref 0.350–4.500)

## 2012-02-23 LAB — HIV ANTIBODY (ROUTINE TESTING W REFLEX): HIV: NONREACTIVE

## 2012-02-23 MED ORDER — NORETHIN ACE-ETH ESTRAD-FE 1-20 MG-MCG PO TABS: 1.0000 | ORAL_TABLET | Freq: Every day | ORAL | Status: DC

## 2012-02-23 NOTE — Progress Notes (Addendum)
Cassie Duncan Dec 08, 1985 [id number]   History:    26 y.o.  for annual gyn exam with no complaints today only for continued issues with her weight. Patient was also interested in obtaining STD screen. She states she has not been sexually active since January. Her cycles her record to be normal. Last Pap in 2012 was normal. 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 Patient currently weighs 237 pounds BMI 41.32. The patient is currently on Janel 1/20 oral contraceptive pill which she takes continuously withdrawals every 3 months. She had been tried on phentermine in the past but she has not had good compliance and has not taken continuously for 3 months as previously been prescribed. She thought that she has some slight vaginal discharge recently.  Past medical history,surgical history, family history and social history were all reviewed and documented in the EPIC chart.  Gynecologic History Patient's last menstrual period was 02/11/2012. Contraception: OCP (estrogen/progesterone) Last Pap: 2012. Results were: normal Last mammogram: Not indicated. Results were: Not indicated  Obstetric History OB History    Grav Para Term Preterm Abortions TAB SAB Ect Mult Living   0                ROS: A ROS was performed and pertinent positives and negatives are included in the history.  GENERAL: No fevers or chills. HEENT: No change in vision, no earache, sore throat or sinus congestion. NECK: No pain or stiffness. CARDIOVASCULAR: No chest pain or pressure. No palpitations. PULMONARY: No shortness of breath, cough or wheeze. GASTROINTESTINAL: No abdominal pain, nausea, vomiting or diarrhea, melena or bright red blood per rectum. GENITOURINARY: No urinary frequency, urgency, hesitancy or dysuria. MUSCULOSKELETAL: No joint or muscle pain, no back pain, no recent trauma. DERMATOLOGIC: No rash, no itching, no lesions. ENDOCRINE: No polyuria,  polydipsia, no heat or cold intolerance. No recent change in weight. HEMATOLOGICAL: No anemia or easy bruising or bleeding. NEUROLOGIC: No headache, seizures, numbness, tingling or weakness. PSYCHIATRIC: No depression, no loss of interest in normal activity or change in sleep pattern.     Exam: chaperone present  BP 130/90  Ht 5' 3.5" (1.613 m)  Wt 237 lb (107.502 kg)  BMI 41.32 kg/m2  LMP 02/11/2012  Body mass index is 41.32 kg/(m^2).  General appearance : Well developed well nourished female. No acute distress HEENT: Neck supple, trachea midline, no carotid bruits, no thyroidmegaly Lungs: Clear to auscultation, no rhonchi or wheezes, or rib retractions  Heart: Regular rate and rhythm, no murmurs or gallops Breast:Examined in sitting and supine position were symmetrical in appearance, no palpable masses or tenderness,  no skin retraction, no nipple inversion, no nipple discharge, no skin discoloration, no axillary or supraclavicular lymphadenopathy Abdomen: no palpable masses or tenderness, no rebound or guarding Extremities: no edema or skin discoloration or tenderness  Pelvic:  Bartholin, Urethra, Skene Glands: Within normal limits             Vagina: No gross lesions or discharge  Cervix: No gross lesions or discharge  Uterus unable to be evaluated due to patient's vaginismus  Adnexa  unable to be evaluated due to patient's vaginismus  Anus and perineum  normal   Rectovaginal  normal sphincter tone without palpated masses or tenderness             Hemoccult not indicated     Assessment/Plan:  26 y.o. female for annual exam who is morbidly obese.  We discussed before the importance of proper nutrition and regular exercise and literature information had been provided. She had been placed on phentermine for 3 months and was also referred to the nutritionist. She did followup with the nutritionist but has had poor compliance with the phentermine. Patient has had issues with her work  and anxiety contributing to her over eating. The following labs were drawn today: Hemoglobin A1c, CBC, TSH, fasting lipid profile, total cholesterol, wet prep (moderate WBC too numerous to count bacteria), GC/Chlamydia culture, HIV, RPR, hepatitis B and C. New Pap smear screening guidelines discussed. No Pap smear drawn today. We'll call and Flagyl 500 mg to take 1 by mouth twice a day for 5 days before. BV. Patient will return back to the office next week for an ultrasound for further assessment uterus and adnexa. We'll rediscuss again issues with her weight. She was reminded to do monthly self breast examination.   Ok Edwards MD, 2:17 PM 02/23/2012

## 2012-02-23 NOTE — Patient Instructions (Addendum)
Intrauterine Device Information  An intrauterine device (IUD) is inserted into your uterus and prevents pregnancy. There are 2 types of IUDs available:  · Copper IUD. This type of IUD is wrapped in copper wire and is placed inside the uterus. Copper makes the uterus and fallopian tubes produce a fluid that kills sperm. The copper IUD can stay in place for 10 years.  · Hormone IUD. This type of IUD contains the hormone progestin (synthetic progesterone). The hormone thickens the cervical mucus and prevents sperm from entering the uterus, and it also thins the uterine lining to prevent implantation of a fertilized egg. The hormone can weaken or kill the sperm that get into the uterus. The hormone IUD can stay in place for 5 years.  Your caregiver will make sure you are a good candidate for a contraceptive IUD. Discuss with your caregiver the possible side effects.  ADVANTAGES  · It is highly effective, reversible, long-acting, and low maintenance.  · There are no estrogen-related side effects.  · An IUD can be used when breastfeeding.  · It is not associated with weight gain.  · It works immediately after insertion.  · The copper IUD does not interfere with your female hormones.  · The progesterone IUD can make heavy menstrual periods lighter.  · The progesterone IUD can be used for 5 years.  · The copper IUD can be used for 10 years.  DISADVANTAGES  · The progesterone IUD can be associated with irregular bleeding patterns.  · The copper IUD can make your menstrual flow heavier and more painful.  · You may experience cramping and vaginal bleeding after insertion.  Document Released: 03/23/2004 Document Revised: 07/12/2011 Document Reviewed: 08/22/2010  ExitCare® Patient Information ©2013 [employer].

## 2012-02-24 LAB — URINALYSIS W MICROSCOPIC + REFLEX CULTURE
Bilirubin Urine: NEGATIVE
Casts: NONE SEEN
Crystals: NONE SEEN
Glucose, UA: NEGATIVE mg/dL
Leukocytes, UA: NEGATIVE
Specific Gravity, Urine: 1.021 (ref 1.005–1.030)
Squamous Epithelial / LPF: NONE SEEN
pH: 5.5 (ref 5.0–8.0)

## 2012-02-25 ENCOUNTER — Other Ambulatory Visit: Payer: Self-pay | Admitting: Gynecology

## 2012-02-25 DIAGNOSIS — R7309 Other abnormal glucose: Secondary | ICD-10-CM

## 2012-02-28 ENCOUNTER — Ambulatory Visit (INDEPENDENT_AMBULATORY_CARE_PROVIDER_SITE_OTHER): Payer: 59

## 2012-02-28 ENCOUNTER — Ambulatory Visit (INDEPENDENT_AMBULATORY_CARE_PROVIDER_SITE_OTHER): Payer: 59 | Admitting: Gynecology

## 2012-02-28 VITALS — BP 128/84

## 2012-02-28 DIAGNOSIS — E663 Overweight: Secondary | ICD-10-CM

## 2012-02-28 DIAGNOSIS — N8300 Follicular cyst of ovary, unspecified side: Secondary | ICD-10-CM

## 2012-02-28 DIAGNOSIS — N942 Vaginismus: Secondary | ICD-10-CM

## 2012-02-28 DIAGNOSIS — E669 Obesity, unspecified: Secondary | ICD-10-CM

## 2012-02-28 MED ORDER — PHENTERMINE HCL 37.5 MG PO CAPS: 37.5000 mg | ORAL_CAPSULE | ORAL | Status: DC

## 2012-02-28 NOTE — Progress Notes (Signed)
Patient is a 26 year old was seen the office on October 23 for her annual gynecological examination please see previous note for details. Patient has had issues in the past with weight. She had been placed on phentermine 37.5 mg daily and was supposed to take it for 3 months course but only took it for less than a month. She had been referred to the nutritionist for guidance which she did follow through. 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 She currently weighs 237 pounds with a BMI of 41.32. When she was here for her annual exam due to the fact that she suffers from vaginismus and adequate pelvic exam was not able to be performed and it is the reason she presented to the office today. She also wanted to go back on the phentermine but for 3 months.  Ultrasound: Uterus measures 7.4 x 5.4 x 4.1 cm with endometrial stripe of 2.1 mm. Both ovaries are otherwise normal.  Assessment/plan: Patient morbidly obese. She now has guidelines to follow for her diet. She is now going to start engaging a regular exercise activity. We will prescribe the phentermine 37.5 mg to take 1 by mouth daily for 3 months only. We rediscussed the potential risk such as pulmonary hypertension and cardiac valvular disease with long-term use. Patient fully accepts and understands the risks. She is currently on oral contraceptive pill. Her recent blood sugar was borderline for diabetes with a hemoglobin A1c and she will return back to the office in a fasting state to have that drawn and she will followup with [place] in one month for pulmonary auscultation and blood pressure and weight readings.

## 2012-02-28 NOTE — Patient Instructions (Signed)
Phentermine tablets or capsules What is this medicine? PHENTERMINE (FEN ter meen) decreases your appetite. It is used with a reduced calorie diet and exercise to help you lose weight. This medicine may be used for other purposes; ask your health care provider or pharmacist if you have questions. What should I tell my health care provider before I take this medicine? They need to know if you have any of these conditions: -agitation -glaucoma -heart disease -high blood pressure -history of substance abuse -lung disease called Primary Pulmonary Hypertension (PPH) -taken an MAOI like Carbex, Eldepryl, Marplan, Nardil, or Parnate in last 14 days -thyroid disease -an unusual or allergic reaction to phentermine, other medicines, foods, dyes, or preservatives -pregnant or trying to get pregnant -breast-feeding How should I use this medicine? Take this medicine by mouth with a glass of water. Follow the directions on the prescription label. This medicine is usually taken 30 minutes before or 1 to 2 hours after breakfast. Avoid taking this medicine in the evening. It may interfere with sleep. Take your doses at regular intervals. Do not take your medicine more often than directed. Talk to your pediatrician regarding the use of this medicine in children. Special care may be needed. Overdosage: If you think you have taken too much of this medicine contact a poison control center or emergency room at once. NOTE: This medicine is only for you. Do not share this medicine with others. What if I miss a dose? If you miss a dose, take it as soon as you can. If it is almost time for your next dose, take only that dose. Do not take double or extra doses. What may interact with this medicine? Do not take this medicine with any of the following medications: -duloxetine -MAOIs like Carbex, Eldepryl, Marplan, Nardil, and Parnate -medicines for colds or breathing difficulties like pseudoephedrine or  phenylephrine -procarbazine -sibutramine -SSRIs like citalopram, escitalopram, fluoxetine, fluvoxamine, paroxetine, and sertraline -stimulants like dexmethylphenidate, methylphenidate or modafinil -venlafaxine This medicine may also interact with the following medications: -medicines for diabetes This list may not describe all possible interactions. Give your health care provider a list of all the medicines, herbs, non-prescription drugs, or dietary supplements you use. Also tell them if you smoke, drink alcohol, or use illegal drugs. Some items may interact with your medicine. What should I watch for while using this medicine? Notify your physician immediately if you become short of breath while doing your normal activities. Do not take this medicine within 6 hours of bedtime. It can keep you from getting to sleep. Avoid drinks that contain caffeine and try to stick to a regular bedtime every night. This medicine was intended to be used in addition to a healthy diet and exercise. The best results are achieved this way. This medicine is only indicated for short-term use. Eventually your weight loss may level out. At that point, the drug will only help you maintain your new weight. Do not increase or in any way change your dose without consulting your doctor. You may get drowsy or dizzy. Do not drive, use machinery, or do anything that needs mental alertness until you know how this medicine affects you. Do not stand or sit up quickly, especially if you are an older patient. This reduces the risk of dizzy or fainting spells. Alcohol may increase dizziness and drowsiness. Avoid alcoholic drinks. What side effects may I notice from receiving this medicine? Side effects that you should report to your doctor or health care professional as soon   as possible: -chest pain, palpitations -depression or severe changes in mood -increased blood pressure -irritability -nervousness or restlessness -severe  dizziness -shortness of breath -problems urinating -unusual swelling of the legs -vomiting Side effects that usually do not require medical attention (report to your doctor or health care professional if they continue or are bothersome): -blurred vision or other eye problems -changes in sexual ability or desire -constipation or diarrhea -difficulty sleeping -dry mouth or unpleasant taste -headache -nausea This list may not describe all possible side effects. Call your doctor for medical advice about side effects. You may report side effects to FDA at 1-800-FDA-1088. Where should I keep my medicine? Keep out of the reach of children. This medicine can be abused. Keep your medicine in a safe place to protect it from theft. Do not share this medicine with anyone. Selling or giving away this medicine is dangerous and against the law. Store at room temperature between 20 and 25 degrees C (68 and 77 degrees F). Keep container tightly closed. Throw away any unused medicine after the expiration date. NOTE: This sheet is a summary. It may not cover all possible information. If you have questions about this medicine, talk to your doctor, pharmacist, or health care provider.  2012, Elsevier/Gold Standard. (06/03/2010 11:02:44 AM) 

## 2012-03-01 ENCOUNTER — Other Ambulatory Visit: Payer: Self-pay

## 2012-03-01 DIAGNOSIS — R7309 Other abnormal glucose: Secondary | ICD-10-CM

## 2012-03-02 LAB — GLUCOSE, FASTING: Glucose, Fasting: 77 mg/dL (ref 70–99)

## 2012-03-07 ENCOUNTER — Telehealth: Payer: Self-pay | Admitting: Gynecology

## 2012-03-07 NOTE — Telephone Encounter (Signed)
Call in [place] Vaginal  To apply qhs x 5 days

## 2012-03-07 NOTE — Telephone Encounter (Signed)
Patient came into pharmacy to pick up Rx.  She said new were to have prescribed a cream for her last week at office visit.  I do see that her Wet Prep had WBC's but not sure what you wanted to prescribe.

## 2012-03-08 MED ORDER — CLINDAMYCIN PHOSPHATE 2 % VA CREA: TOPICAL_CREAM | VAGINAL | Status: DC

## 2012-03-08 NOTE — Telephone Encounter (Signed)
Rx escribed.

## 2012-03-09 ENCOUNTER — Telehealth: Payer: Self-pay | Admitting: *Deleted

## 2012-03-09 MED ORDER — NORETHIN ACE-ETH ESTRAD-FE 1-20 MG-MCG PO TABS: 1.0000 | ORAL_TABLET | Freq: Every day | ORAL | Status: DC

## 2012-03-09 NOTE — Telephone Encounter (Signed)
Pt called requesting recent glucose results. rx for birth control pills sent.

## 2012-03-13 ENCOUNTER — Other Ambulatory Visit: Payer: Self-pay | Admitting: *Deleted

## 2012-03-13 MED ORDER — NORETHIN ACE-ETH ESTRAD-FE 1-20 MG-MCG PO TABS: 1.0000 | ORAL_TABLET | Freq: Every day | ORAL | Status: DC

## 2012-04-07 ENCOUNTER — Ambulatory Visit: Payer: 59 | Admitting: Gynecology

## 2012-04-18 ENCOUNTER — Other Ambulatory Visit: Payer: Self-pay | Admitting: Gynecology

## 2012-04-18 NOTE — Telephone Encounter (Signed)
Patient received RX for oc's last month and Dr. Glenetta Hew prescribed one pack with 11 refills. She would like to get 3 packs at a time.  I contacted pharmacy and let them know that would be fine.  Patient informed.

## 2012-04-21 ENCOUNTER — Ambulatory Visit: Payer: Self-pay | Admitting: Gynecology

## 2012-05-18 ENCOUNTER — Ambulatory Visit: Payer: Self-pay | Admitting: Gynecology

## 2013-07-26 ENCOUNTER — Other Ambulatory Visit: Payer: Self-pay | Admitting: Gynecology

## 2013-07-27 ENCOUNTER — Other Ambulatory Visit: Payer: Self-pay | Admitting: Gynecology

## 2013-09-07 ENCOUNTER — Encounter: Payer: Self-pay | Admitting: Gynecology

## 2013-11-19 ENCOUNTER — Encounter: Payer: Self-pay | Admitting: Gynecology

## 2013-11-30 ENCOUNTER — Other Ambulatory Visit: Payer: Self-pay

## 2013-12-24 ENCOUNTER — Encounter: Payer: Self-pay | Admitting: Gynecology

## 2015-07-18 ENCOUNTER — Encounter: Payer: Self-pay | Admitting: Gynecology

## 2015-08-01 ENCOUNTER — Ambulatory Visit (INDEPENDENT_AMBULATORY_CARE_PROVIDER_SITE_OTHER): Payer: Self-pay | Admitting: Gynecology

## 2015-08-01 ENCOUNTER — Encounter: Payer: Self-pay | Admitting: Gynecology

## 2015-08-04 NOTE — Progress Notes (Signed)
Office visit was canceled by the patient before being seen. She decided to wait for her appointment with Dr. Toney Rakes

## 2015-09-05 ENCOUNTER — Ambulatory Visit (INDEPENDENT_AMBULATORY_CARE_PROVIDER_SITE_OTHER): Payer: Self-pay | Admitting: Gynecology

## 2015-09-05 ENCOUNTER — Encounter: Payer: Self-pay | Admitting: Gynecology

## 2015-09-05 VITALS — BP 120/82 | Ht 63.0 in | Wt 252.4 lb

## 2015-09-05 DIAGNOSIS — N938 Other specified abnormal uterine and vaginal bleeding: Secondary | ICD-10-CM

## 2015-09-05 DIAGNOSIS — Z01419 Encounter for gynecological examination (general) (routine) without abnormal findings: Secondary | ICD-10-CM

## 2015-09-05 DIAGNOSIS — E663 Overweight: Secondary | ICD-10-CM

## 2015-09-05 DIAGNOSIS — N942 Vaginismus: Secondary | ICD-10-CM

## 2015-09-05 LAB — CBC WITH DIFFERENTIAL/PLATELET
BASOS ABS: 0 {cells}/uL (ref 0–200)
Basophils Relative: 0 %
EOS ABS: 71 {cells}/uL (ref 15–500)
Eosinophils Relative: 1 %
HEMATOCRIT: 37.9 % (ref 35.0–45.0)
Hemoglobin: 12.6 g/dL (ref 11.7–15.5)
LYMPHS PCT: 44 %
Lymphs Abs: 3124 cells/uL (ref 850–3900)
MCH: 29.6 pg (ref 27.0–33.0)
MCHC: 33.2 g/dL (ref 32.0–36.0)
MCV: 89 fL (ref 80.0–100.0)
MONO ABS: 355 {cells}/uL (ref 200–950)
MONOS PCT: 5 %
MPV: 9.2 fL (ref 7.5–12.5)
NEUTROS PCT: 50 %
Neutro Abs: 3550 cells/uL (ref 1500–7800)
PLATELETS: 325 10*3/uL (ref 140–400)
RBC: 4.26 MIL/uL (ref 3.80–5.10)
RDW: 13.2 % (ref 11.0–15.0)
WBC: 7.1 10*3/uL (ref 3.8–10.8)

## 2015-09-05 LAB — LIPID PANEL
CHOL/HDL RATIO: 3.3 ratio (ref <=5.0)
Cholesterol: 154 mg/dL (ref 125–200)
HDL: 46 mg/dL (ref >=46)
LDL Cholesterol: 100 mg/dL (ref <130)
Triglycerides: 38 mg/dL (ref <150)
VLDL: 8 mg/dL (ref <30)

## 2015-09-05 LAB — COMPREHENSIVE METABOLIC PANEL
ALK PHOS: 51 U/L (ref 33–115)
ALT: 15 U/L (ref 6–29)
AST: 19 U/L (ref 10–30)
Albumin: 3.7 g/dL (ref 3.6–5.1)
BUN: 12 mg/dL (ref 7–25)
CALCIUM: 9.2 mg/dL (ref 8.6–10.2)
CHLORIDE: 108 mmol/L (ref 98–110)
CO2: 17 mmol/L — AB (ref 20–31)
Creat: 0.79 mg/dL (ref 0.50–1.10)
GLUCOSE: 91 mg/dL (ref 65–99)
POTASSIUM: 4.5 mmol/L (ref 3.5–5.3)
Sodium: 138 mmol/L (ref 135–146)
Total Bilirubin: 0.3 mg/dL (ref 0.2–1.2)
Total Protein: 7.3 g/dL (ref 6.1–8.1)

## 2015-09-05 LAB — TSH: TSH: 2.2 m[IU]/L

## 2015-09-05 NOTE — Addendum Note (Signed)
Addended by: Thamas Jaegers on: 09/05/2015 12:10 PM   Modules accepted: Orders

## 2015-09-05 NOTE — Progress Notes (Signed)
Cassie Duncan March 12, 1986 [id number]   History:    30 y.o.  for annual gyn exam with complaints since January of having 2 cycles per month lasting an average of 5-7 days and cramping. Patient has not been sexually active since 2014. Patient with no form of contraception. Patient with no past history of any abnormal Pap smear. Patient is morbidly obese weighing 252 pounds (BMI 44.71 kg/m) as stated she lost 30 pounds since October 2016 on a diet and exercise plan as she's currently engaging in. Patient denies any visual disturbances or unusual headaches or nipple discharge.  Past medical history,surgical history, family history and social history were all reviewed and documented in the EPIC chart.  Gynecologic History Patient's last menstrual period was 08/14/2015. Contraception: none Last Pap: 2012. Results were: normal Last mammogram: Not indicated. Results were: Not indicated  Obstetric History OB History  Gravida Para Term Preterm AB SAB TAB Ectopic Multiple Living  0                  ROS: A ROS was performed and pertinent positives and negatives are included in the history.  GENERAL: No fevers or chills. HEENT: No change in vision, no earache, sore throat or sinus congestion. NECK: No pain or stiffness. CARDIOVASCULAR: No chest pain or pressure. No palpitations. PULMONARY: No shortness of breath, cough or wheeze. GASTROINTESTINAL: No abdominal pain, nausea, vomiting or diarrhea, melena or bright red blood per rectum. GENITOURINARY: No urinary frequency, urgency, hesitancy or dysuria. MUSCULOSKELETAL: No joint or muscle pain, no back pain, no recent trauma. DERMATOLOGIC: No rash, no itching, no lesions. ENDOCRINE: No polyuria, polydipsia, no heat or cold intolerance. No recent change in weight. HEMATOLOGICAL: No anemia or easy bruising or bleeding. NEUROLOGIC: No headache, seizures, numbness, tingling or weakness. PSYCHIATRIC: No depression, no loss of interest in normal activity or  change in sleep pattern.     Exam: chaperone present  BP 120/82 mmHg  Ht 5\' 3"  (1.6 m)  Wt 252 lb 6.4 oz (114.488 kg)  BMI 44.72 kg/m2  LMP 08/14/2015  Body mass index is 44.72 kg/(m^2).  General appearance : Well developed well nourished female. No acute distress HEENT: Eyes: no retinal hemorrhage or exudates,  Neck supple, trachea midline, no carotid bruits, no thyroidmegaly Lungs: Clear to auscultation, no rhonchi or wheezes, or rib retractions  Heart: Regular rate and rhythm, no murmurs or gallops Breast:Examined in sitting and supine position were symmetrical in appearance, no palpable masses or tenderness,  no skin retraction, no nipple inversion, no nipple discharge, no skin discoloration, no axillary or supraclavicular lymphadenopathy Abdomen: no palpable masses or tenderness, no rebound or guarding Extremities: no edema or skin discoloration or tenderness  Pelvic:  Bartholin, Urethra, Skene Glands: Within normal limits             Vagina: No gross lesions or discharge  Cervix: No gross lesions or discharge  Uterus  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 female for annual exam who is morbidly obese with dysfunctional uterine bleeding will return back after her menstrual cycle for sonohysterogram and possible endometrial biopsy. Pap smear was done today. The following screening blood work was ordered: Comprehensive metabolic panel, fasting blood sugar, TSH, CBC, and urinalysis. Pelvic examination was not performed due to patient's  vaginismus but we will get a better detail her uterus and tubes and ovaries at time of Dollene Primrose MD, 11:57 AM 09/05/2015

## 2015-09-05 NOTE — Patient Instructions (Signed)
Levonorgestrel intrauterine device (IUD) What is this medicine? LEVONORGESTREL IUD (LEE voe nor jes trel) is a contraceptive (birth control) device. The device is placed inside the uterus by a healthcare professional. It is used to prevent pregnancy and can also be used to treat heavy bleeding that occurs during your period. Depending on the device, it can be used for 3 to 5 years. This medicine may be used for other purposes; ask your health care provider or pharmacist if you have questions. What should I tell my health care provider before I take this medicine? They need to know if you have any of these conditions: -abnormal Pap smear -cancer of the breast, uterus, or cervix -diabetes -endometritis -genital or pelvic infection now or in the past -have more than one sexual partner or your partner has more than one partner -heart disease -history of an ectopic or tubal pregnancy -immune system problems -IUD in place -liver disease or tumor -problems with blood clots or take blood-thinners -use intravenous drugs -uterus of unusual shape -vaginal bleeding that has not been explained -an unusual or allergic reaction to levonorgestrel, other hormones, silicone, or polyethylene, medicines, foods, dyes, or preservatives -pregnant or trying to get pregnant -breast-feeding How should I use this medicine? This device is placed inside the uterus by a health care professional. Talk to your pediatrician regarding the use of this medicine in children. Special care may be needed. Overdosage: If you think you have taken too much of this medicine contact a poison control center or emergency room at once. NOTE: This medicine is only for you. Do not share this medicine with others. What if I miss a dose? This does not apply. What may interact with this medicine? Do not take this medicine with any of the following medications: -amprenavir -bosentan -fosamprenavir This medicine may also interact with  the following medications: -aprepitant -barbiturate medicines for inducing sleep or treating seizures -bexarotene -griseofulvin -medicines to treat seizures like carbamazepine, ethotoin, felbamate, oxcarbazepine, phenytoin, topiramate -modafinil -pioglitazone -rifabutin -rifampin -rifapentine -some medicines to treat HIV infection like atazanavir, indinavir, lopinavir, nelfinavir, tipranavir, ritonavir -St. John's wort -warfarin This list may not describe all possible interactions. Give your health care provider a list of all the medicines, herbs, non-prescription drugs, or dietary supplements you use. Also tell them if you smoke, drink alcohol, or use illegal drugs. Some items may interact with your medicine. What should I watch for while using this medicine? Visit your doctor or health care professional for regular check ups. See your doctor if you or your partner has sexual contact with others, becomes HIV positive, or gets a sexual transmitted disease. This product does not protect you against HIV infection (AIDS) or other sexually transmitted diseases. You can check the placement of the IUD yourself by reaching up to the top of your vagina with clean fingers to feel the threads. Do not pull on the threads. It is a good habit to check placement after each menstrual period. Call your doctor right away if you feel more of the IUD than just the threads or if you cannot feel the threads at all. The IUD may come out by itself. You may become pregnant if the device comes out. If you notice that the IUD has come out use a backup birth control method like condoms and call your health care provider. Using tampons will not change the position of the IUD and are okay to use during your period. What side effects may I notice from receiving this medicine?   Side effects that you should report to your doctor or health care professional as soon as possible: -allergic reactions like skin rash, itching or  hives, swelling of the face, lips, or tongue -fever, flu-like symptoms -genital sores -high blood pressure -no menstrual period for 6 weeks during use -pain, swelling, warmth in the leg -pelvic pain or tenderness -severe or sudden headache -signs of pregnancy -stomach cramping -sudden shortness of breath -trouble with balance, talking, or walking -unusual vaginal bleeding, discharge -yellowing of the eyes or skin Side effects that usually do not require medical attention (report to your doctor or health care professional if they continue or are bothersome): -acne -breast pain -change in sex drive or performance -changes in weight -cramping, dizziness, or faintness while the device is being inserted -headache -irregular menstrual bleeding within first 3 to 6 months of use -nausea This list may not describe all possible side effects. Call your doctor for medical advice about side effects. You may report side effects to FDA at 1-800-FDA-1088. Where should I keep my medicine? This does not apply. NOTE: This sheet is a summary. It may not cover all possible information. If you have questions about this medicine, talk to your doctor, pharmacist, or health care provider.    2016, Elsevier/Gold Standard. (2011-05-20 13:54:04)  

## 2015-09-06 LAB — URINALYSIS W MICROSCOPIC + REFLEX CULTURE
BILIRUBIN URINE: NEGATIVE
Bacteria, UA: NONE SEEN [HPF]
CASTS: NONE SEEN [LPF]
CRYSTALS: NONE SEEN [HPF]
Glucose, UA: NEGATIVE
HGB URINE DIPSTICK: NEGATIVE
KETONES UR: NEGATIVE
Leukocytes, UA: NEGATIVE
Nitrite: NEGATIVE
PROTEIN: NEGATIVE
SPECIFIC GRAVITY, URINE: 1.026 (ref 1.001–1.035)
Squamous Epithelial / LPF: NONE SEEN [HPF] (ref <=5)
WBC UA: NONE SEEN WBC/HPF (ref <=5)
Yeast: NONE SEEN [HPF]
pH: 5.5 (ref 5.0–8.0)

## 2015-09-07 LAB — URINE CULTURE
Colony Count: NO GROWTH
ORGANISM ID, BACTERIA: NO GROWTH

## 2015-09-08 ENCOUNTER — Other Ambulatory Visit: Payer: Self-pay | Admitting: Gynecology

## 2015-09-08 DIAGNOSIS — N939 Abnormal uterine and vaginal bleeding, unspecified: Secondary | ICD-10-CM

## 2015-09-08 DIAGNOSIS — N942 Vaginismus: Secondary | ICD-10-CM

## 2015-09-09 ENCOUNTER — Telehealth: Payer: Self-pay

## 2015-09-09 LAB — PAP IG W/ RFLX HPV ASCU

## 2015-09-09 NOTE — Telephone Encounter (Signed)
Patient informed.  Rx called into pharmacy.

## 2015-09-09 NOTE — Telephone Encounter (Signed)
Patient was in for office visit last week. She said she mentioned that she was having pain but was not offered a pain reliever. She said she feels like she needs Rx for pain. She said she is bleeding again today and pain to where she is going to have to leave work.  She currently takes OTC Pamprin "3 every couple of hours".   She has [place] scheduled 10/01/15.  NKDA

## 2015-09-09 NOTE — Telephone Encounter (Signed)
Already have encounter opened. 

## 2015-09-09 NOTE — Telephone Encounter (Signed)
Tramadol (Ultram) 50 MG TID PRN # 30

## 2015-10-01 ENCOUNTER — Other Ambulatory Visit: Payer: Self-pay

## 2015-10-01 ENCOUNTER — Ambulatory Visit: Payer: Self-pay | Admitting: Gynecology

## 2015-10-17 ENCOUNTER — Ambulatory Visit: Payer: Self-pay | Admitting: Gynecology

## 2015-10-17 ENCOUNTER — Other Ambulatory Visit: Payer: Self-pay

## 2015-11-19 ENCOUNTER — Ambulatory Visit (INDEPENDENT_AMBULATORY_CARE_PROVIDER_SITE_OTHER): Payer: Self-pay

## 2015-11-19 ENCOUNTER — Encounter: Payer: Self-pay | Admitting: Gynecology

## 2015-11-19 ENCOUNTER — Ambulatory Visit (INDEPENDENT_AMBULATORY_CARE_PROVIDER_SITE_OTHER): Payer: Self-pay | Admitting: Gynecology

## 2015-11-19 DIAGNOSIS — N939 Abnormal uterine and vaginal bleeding, unspecified: Secondary | ICD-10-CM

## 2015-11-19 DIAGNOSIS — E663 Overweight: Secondary | ICD-10-CM

## 2015-11-19 DIAGNOSIS — N942 Vaginismus: Secondary | ICD-10-CM

## 2015-11-19 DIAGNOSIS — D251 Intramural leiomyoma of uterus: Secondary | ICD-10-CM

## 2015-11-19 DIAGNOSIS — N938 Other specified abnormal uterine and vaginal bleeding: Secondary | ICD-10-CM

## 2015-11-19 NOTE — Progress Notes (Signed)
    patient is a 30 year old who was seen in the office for her annual exam on 09/05/2015 and had been complaining of having 2 cycles per monthlasting average of 5-7 days and cramping. Patient has not been sexually active since 2014. Patient with no past history of any abnormal Pap smear but she isoverweight and has been working on her diet. She stated she has lost 30 pounds is October 2016 by eating healthier and exercising regularly.She is here today for sonohysterogram and endometrial biopsy and to plan a course of management for her DUB.  Ultrasound today: Uterus measured 8.0 x 5.8 x 4.6 cm with endometrial stripe of 7.2 mm patient with 2 small intramural fibroids one measuring 15 x 11 mm the second one 16 x 12 mm. Right ovary thick wall collapsed follicle measuring 16 x 8 x 17 mm negative color flow left ovary normal fluid in the cul-de-sac 44 x 7 x 48 mm.  The cervix was then cleansed with Betadine solution a signal 2 tenaculum was placed on the anterior cervical lip the cervical canal required dilatation followed by insertion of a sterile catheter and fluid was instilled into the intrauterine cavity and no abnormalities were noted. After this a sterile Pipelle was introduced into the uterine cavity and tissue was obtained for histological evaluation The signal 2 tenaculum was removed patient tolerated procedure well  Assessment/plan morbidly obese patient who has lost 30 pounds in the past several months by exercising and eating healthy had normal TSH, prolactin and FSH along with CBC and Pap smear recently.Ultrasound essentially normal. Endometrial biopsy pending at time of this dictation. Patient will be scheduled  For placement of progesterone only IUD in the next several weeks at the time of her cycle for which patient had been provided with literature information.

## 2015-11-20 ENCOUNTER — Telehealth: Payer: Self-pay

## 2015-11-20 NOTE — Telephone Encounter (Signed)
Patient informed. She asked if normal then why was she bleeding?

## 2015-11-20 NOTE — Telephone Encounter (Signed)
All studies negative. Patients who are overweight produce more estrogen which destabilizes the endometrium. This is why she will benefit from the [facility]

## 2015-11-20 NOTE — Telephone Encounter (Signed)
-----   Message from Terrance Mass, MD sent at 11/20/2015  4:17 PM EDT ----- Inform patient pathology report benign

## 2015-11-21 NOTE — Telephone Encounter (Signed)
Patient informed. 

## 2015-11-25 ENCOUNTER — Telehealth: Payer: Self-pay | Admitting: Gynecology

## 2015-11-25 NOTE — Telephone Encounter (Signed)
11/25/15-Pt was advised today that since she has no commercial insurance her cost for the Mirena IUD and insertion would be $796.50. This is the cost after giving her a 55% disc off regular price of $1770. Pt advised need to pay in full day of insertion and for insertion to be done while on cycle. She will call if she decides to proceed.wl

## 2016-01-07 ENCOUNTER — Telehealth: Payer: Self-pay

## 2016-01-07 MED ORDER — MEFENAMIC ACID 250 MG PO CAPS: ORAL_CAPSULE | ORAL | 5 refills | Status: DC

## 2016-01-07 MED ORDER — HYDROCODONE-ACETAMINOPHEN 5-300 MG PO TABS: ORAL_TABLET | ORAL | 0 refills | Status: DC

## 2016-01-07 NOTE — Telephone Encounter (Signed)
Please see the 2nd paragraph of my original message. [place] for letter?

## 2016-01-07 NOTE — Telephone Encounter (Signed)
If she still on the oral contraceptive pill? We had discussed the Mirena IUD in the past? I cannot give her narcotic every month to take with her menstrual cycles but for now we can call her and Vicodin 5 mg/301 by mouth every 4-6 hours when necessary #30 no refills. For her following cycles if she has menstrual cramp call her in Ponstel 250 mg one by mouth every 6 hours when necessary #30 with 5 refills

## 2016-01-07 NOTE — Telephone Encounter (Signed)
Yes

## 2016-01-07 NOTE — Telephone Encounter (Signed)
Patient called complaining of really bad menstrual cramps not relieved by Tramadol that you previously prescribed for her. She asked if you could prescribe something stronger. She said she has seen you previously regarding her painful periods.  Also, patient said her pain causes her to miss work and she is wondering if you would provide a note to work to justify her absences.

## 2016-01-07 NOTE — Telephone Encounter (Signed)
Patient said she has never been on OC. She is going to have MIrena inserted but she is private pay patient and she said it was very expensive for her. She forsees doing this sometime end of Sept/Oct.  She understands that the Vicodin is a one time Rx and she should take it sparingly as it is addictive. She understands she will have to come by to pick up written Rx for it.  Explained that we would send Ponstel Rx to her pharmacy.  As for note for work she said she was late starting today and yesterday and just needs a note to cover her for that. I will leave that at front desk for her with Rx to be picked up per patient request.

## 2016-06-22 ENCOUNTER — Telehealth: Payer: Self-pay

## 2016-06-22 NOTE — Telephone Encounter (Signed)
Patient spoke with someone today and called in my voice mail. She said that she just discovered that when she was here in nov for her annual exam that no STD testing or Vit D level was done. She said that she requested both and wanted to know why they were not done.  I called her back and received voice mail and left detailed message per DPR access note on file and explained no way to know why not done. No mention of her request in the office note or indication to do this testing. I apologized for this.  I offered to place orders for blood testing she requested so she can come and do this and to schedule office visit for cultures.  I asked her to call me back and let me know her desires and I will take care of it for her.

## 2016-08-20 ENCOUNTER — Telehealth: Payer: Self-pay | Admitting: *Deleted

## 2016-08-20 MED ORDER — TRAMADOL HCL 50 MG PO TABS: 50.0000 mg | ORAL_TABLET | Freq: Four times a day (QID) | ORAL | 0 refills | Status: DC | PRN

## 2016-08-20 NOTE — Telephone Encounter (Signed)
Okay for Ultram 50 mg #20 one by mouth every 6 hours when necessary pain no refill

## 2016-08-20 NOTE — Telephone Encounter (Signed)
(  JF patient) pt cycle started yesterday and she has bad cramps, was prescribed mefenamic acid 250 mg for cramps states medication doesn't help any. Pt had old Rx for ultram 50 mg Dr.Fernandez prescribed on 09/09/15 had 1 pill left  Took yesterday and it helping with cramps. Pt aware JF is out of the office and asked if you would consider refilling Rx? Please advise

## 2016-08-20 NOTE — Telephone Encounter (Signed)
Pt informed with the below note, Rx sent. 

## 2016-09-15 ENCOUNTER — Encounter: Payer: Self-pay | Admitting: Gynecology

## 2017-08-18 ENCOUNTER — Encounter: Payer: Self-pay | Attending: Gynecology

## 2017-08-18 DIAGNOSIS — Z713 Dietary counseling and surveillance: Secondary | ICD-10-CM | POA: Insufficient documentation

## 2017-08-18 DIAGNOSIS — E669 Obesity, unspecified: Secondary | ICD-10-CM

## 2017-08-18 DIAGNOSIS — Z6841 Body Mass Index (BMI) 40.0 and over, adult: Secondary | ICD-10-CM | POA: Insufficient documentation

## 2017-08-18 NOTE — Progress Notes (Signed)
Medical Nutrition Therapy:  Appt start time: 3403 end time:  1140.  Assessment:  Primary concerns today: weight loss. Pt was referred for weight management. Pt just restarted [employer] (08/15/17) wants to go 2-3 times per week. Pt incorporating multiple lifestyle changes and is frustrated with no weight changes. Pt has been going to [employer] for the past 2.5 years (4 days for an hour/wk). She also meets with a fitness coach weekly to discuss weight and motivation. 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 Pt met with finical advisor and quit the weekly [employer] she subscribed to. Pt experiences bad menstrual camps and just got Nuvaring. Pt interested in IUD.  Preferred Learning Style:  No preference indicated   Learning Readiness:  Contemplating  Ready  Change in progress  MEDICATIONS: Reviewed   DIETARY INTAKE:  Usual eating pattern includes 2-3 meals and 0-1 snacks per day.  Everyday foods include some form of chicken with sauce.  Avoided foods include broccoli, beef, pork, tomatoes, cucumbers.    24-hr recall:  B ( AM): apple slices with almond butter or honey roasted peanut butter from fresh market. [place] or [employer] mocha frappe extra sweet ( extra pumps and caramel)  Snk ( AM): None reported   L ( PM): past- food prep service (chicken with black beans) current- [place] and cheese sandwich with mustard Snk ( PM): None reported  D ( PM): [place] ACP or [place] or [employer]fila Snk ( PM): milkshake or donut (late night sweet cravings) Beverages: water  Usual physical activity: [employer] (4 days for an hour/wk)  Progress Towards Goal(s):  In progress.   Nutritional Diagnosis:  NI-1.5 Excessive energy intake As related to nutrition knowledge deficit.  As evidenced by pt's dietary recall.    Intervention:  Nutrition education provided.  Discussed trying/finding new recipes to  find balance and spark interest. Reviewed portion sizes. Recommended balanced meals/snacks. Encouraged pt to pay attention to hunger/fullness cues by reducing distractions and slowing rate of meal consumption. Emphasized additional benefits to continued exercise, other than just weight loss.   Teaching Method Utilized: Visual Auditory  Handouts given during visit include:  MyPlate handout  Cooking method handout  Barriers to learning/adherence to lifestyle change: Pt is limited in time being very busy, no learning barriers  Demonstrated degree of understanding via:  Teach Back   Monitoring/Evaluation:  Dietary intake, exercise, and body weight prn.

## 2017-08-18 NOTE — Patient Instructions (Addendum)
-   New salad container and salad additives  - Try new recipes and make plan for weekly meals  - Consume balanced meal consisting of all food groups - Pay attention to hunger/fullness cues by reducing distractions and slowing rate of meal consumption.  - Serving size reminder: protein (palm), butter (pointer finger:teaspoon), peanut butter (thumb: tablespoon), carbohydrate/pasta (fist) - Look up easy recipes roast vegetables, easy vinaigrette, adding lemons/vingear/spices/garlic/onion

## 2019-01-22 ENCOUNTER — Encounter: Payer: Self-pay | Admitting: Gynecology

## 2019-07-26 DIAGNOSIS — R635 Abnormal weight gain: Secondary | ICD-10-CM | POA: Insufficient documentation

## 2019-07-26 HISTORY — DX: Abnormal weight gain: R63.5

## 2019-09-10 ENCOUNTER — Institutional Professional Consult (permissible substitution): Payer: Self-pay | Admitting: Neurology

## 2019-10-05 ENCOUNTER — Encounter: Payer: Self-pay | Admitting: Physical Therapy

## 2019-10-05 ENCOUNTER — Ambulatory Visit: Payer: Self-pay | Attending: Internal Medicine | Admitting: Physical Therapy

## 2019-10-05 ENCOUNTER — Other Ambulatory Visit: Payer: Self-pay

## 2019-10-05 DIAGNOSIS — M545 Low back pain, unspecified: Secondary | ICD-10-CM

## 2019-10-05 DIAGNOSIS — M25571 Pain in right ankle and joints of right foot: Secondary | ICD-10-CM | POA: Insufficient documentation

## 2019-10-05 DIAGNOSIS — R252 Cramp and spasm: Secondary | ICD-10-CM | POA: Insufficient documentation

## 2019-10-05 DIAGNOSIS — M25561 Pain in right knee: Secondary | ICD-10-CM | POA: Insufficient documentation

## 2019-10-05 DIAGNOSIS — M6281 Muscle weakness (generalized): Secondary | ICD-10-CM | POA: Insufficient documentation
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
## 2019-10-05 NOTE — Patient Instructions (Signed)
Access Code: [email or web address] URL: [email or web address] Date: 10/05/2019 Prepared by: Amador Cunas  Exercises Sidelying Thoracic Rotation with Open Book - 1 x daily - 7 x weekly - 3 sets - 10 reps - 5 sec hold Supine Lower Trunk Rotation - 1 x daily - 7 x weekly - 3 sets - 10 reps - 5 sec hold Child's Pose Stretch - 1 x daily - 7 x weekly - 3 sets - 3 reps - 20 sec hold Child's Pose with Sidebending - 1 x daily - 7 x weekly - 3 sets - 3 reps - 20 sec hold Seated Ankle Circles - 1 x daily - 7 x weekly - 10 reps - 3 sets Seated Heel Raise - 1 x daily - 7 x weekly - 10 reps - 3 sets Seated Toe Raise - 1 x daily - 7 x weekly - 10 reps - 3 sets

## 2019-10-11 ENCOUNTER — Other Ambulatory Visit: Payer: Self-pay

## 2019-10-11 ENCOUNTER — Ambulatory Visit: Payer: Self-pay | Admitting: Physical Therapy

## 2019-10-11 DIAGNOSIS — M6281 Muscle weakness (generalized): Secondary | ICD-10-CM

## 2019-10-11 DIAGNOSIS — M25571 Pain in right ankle and joints of right foot: Secondary | ICD-10-CM

## 2019-10-11 DIAGNOSIS — M25561 Pain in right knee: Secondary | ICD-10-CM

## 2019-10-11 DIAGNOSIS — M545 Low back pain, unspecified: Secondary | ICD-10-CM

## 2019-10-11 NOTE — Therapy (Signed)
[place] [place], [place], [address] Phone: [phone]   Fax:  [phone]  Physical Therapy Treatment  Patient Details  Name: Cassie Duncan MRN: [id number] Date of Birth: 02/07/1986 Referring Provider (PT): Cassie Duncan   Encounter Date: 10/11/2019   PT End of Session - 10/11/19 1054    Visit Number 2    Date for PT Re-Evaluation 12/05/19    PT Start Time 2979    PT Stop Time 1101    PT Time Calculation (min) 38 min           No past medical history on file.  Past Surgical History:  Procedure Laterality Date  . WISDOM TOOTH EXTRACTION  2005    There were no vitals filed for this visit.   Subjective Assessment - 10/11/19 1025    Subjective pt 8 min late. doing stretches and ex from eval- helped alitte ( pain ranking foot/ankle,knees,hips and back - less spasms)    Currently in Pain? Yes    Pain Score 6                              [facility] Adult PT Treatment/Exercise - 10/11/19 0001      Lumbar Exercises: Aerobic   Nustep L 4 6 min      Knee/Hip Exercises: Machines for Strengthening   Cybex Leg Press 30# 2 sets 10   calf raises 30# 2 set s10     Knee/Hip Exercises: Seated   Long Arc Quad Strengthening;Both;15 reps   green tband   Ball Squeeze 15x    Clamshell with Investment banker, corporate;Both   green tband     Manual Therapy   Manual Therapy Passive ROM;Soft tissue mobilization    Soft tissue mobilization metatarsals    Passive ROM RT ankle and foot       Ankle Exercises: Standing   Heel Raises Both;15 reps    Toe Raise 15 reps    Other Standing Ankle Exercises sit fit ankle ROM 10 times each                  PT Education - 10/11/19 1049    Education Details LE ther ex green tband    Person(s) Educated Patient    Methods Explanation;Demonstration;Handout    Comprehension Verbalized understanding;Returned demonstration             PT Short Term Goals - 10/11/19 1055      PT SHORT TERM GOAL #1   Title Pt will be independent with HEP    Status Achieved             PT Long Term Goals - 10/05/19 1203      PT LONG TERM GOAL #1   Title Pt will demonstrate ankle ROM WFL and painfree    Time 8    Period Weeks    Status New    Target Date 11/30/19      PT LONG TERM GOAL #2   Title 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 pt tolerated initail ex progression well- some pain in RT foot/ankle,tenderness with STW    PT Treatment/Interventions ADLs/Self Care Home Management;Cryotherapy;Electrical Stimulation;Ultrasound;Moist Heat;Iontophoresis 27m/ml Dexamethasone;Gait training;Stair training;Functional mobility training;Therapeutic activities;Therapeutic exercise;Neuromuscular re-education;Manual techniques;Patient/family education;Passive range of motion;Dry needling;Spinal Manipulations;Vasopneumatic Device;Taping    PT Next Visit Plan Review HEP, get pt moving. Pt experiencing pain in ankle, knee, hip, UT, and LB; assess primary focus based on pt presentation at next rx (today LB and ankle were main pt concerns)           Patient will benefit from skilled therapeutic intervention in order to improve the following deficits and impairments:  Abnormal gait, Decreased range of motion, Difficulty walking, Increased muscle spasms, Obesity, Pain, Hypomobility  Visit  Diagnosis: Acute bilateral low back pain without sciatica  Muscle weakness (generalized)  Pain in right ankle and joints of right foot  Acute pain of right knee     Problem List Patient Active Problem List   Diagnosis Date Noted  . Intramural leiomyoma of uterus 11/19/2015  . DUB (dysfunctional uterine bleeding) 09/05/2015  . Obesity (BMI 30-39.9) 03/19/2011    Cassie Duncan,ANGIE PTA 10/11/2019, 10:57 AM  [address] [address]Phone: [phone]  Fax:  [phone] Name: Cassie GoodhartMRN: [id number] of Birth: 920-Oct-1987

## 2019-10-15 ENCOUNTER — Encounter: Payer: Self-pay | Admitting: Physical Therapy

## 2019-10-15 ENCOUNTER — Ambulatory Visit: Payer: Self-pay | Admitting: Physical Therapy

## 2019-10-15 ENCOUNTER — Other Ambulatory Visit: Payer: Self-pay

## 2019-10-15 DIAGNOSIS — R252 Cramp and spasm: Secondary | ICD-10-CM

## 2019-10-15 DIAGNOSIS — M25571 Pain in right ankle and joints of right foot: Secondary | ICD-10-CM

## 2019-10-15 DIAGNOSIS — M25561 Pain in right knee: Secondary | ICD-10-CM

## 2019-10-15 DIAGNOSIS — M545 Low back pain, unspecified: Secondary | ICD-10-CM

## 2019-10-15 DIAGNOSIS — M6281 Muscle weakness (generalized): Secondary | ICD-10-CM

## 2019-10-15 NOTE — Therapy (Signed)
[place], [place], [address] Phone: [phone]   Fax:  [phone]  Physical Therapy Treatment  Patient Details  Name: Cassie Duncan MRN: [id number] Date of Birth: Apr 30, 1986 Referring Provider (PT): Delia Chimes   Encounter Date: 10/15/2019   PT End of Session - 10/15/19 1613    Visit Number 3    Date for PT Re-Evaluation 12/05/19    PT Start Time 9326    PT Stop Time 1530    PT Time Calculation (min) 43 min    Activity Tolerance Patient tolerated treatment well    Behavior During Therapy [facility] for tasks assessed/performed           History reviewed. No pertinent past medical history.  Past Surgical History:  Procedure Laterality Date  . WISDOM TOOTH EXTRACTION  2005    There were no vitals filed for this visit.   Subjective Assessment - 10/15/19 1446    Subjective Pt stated that her ankle is feeling a little sore today d/t lots of driving but that it felt much better after massage last rx.    Pain Score 6     Pain Location Ankle    Pain Orientation Right    Pain Score 0    Pain Location Knee    Pain Orientation Right    Pain Score 4    Pain Location Back                             [facility] Adult PT Treatment/Exercise - 10/15/19 0001      Knee/Hip Exercises: Stretches   Gastroc Stretch Both;60 seconds      Knee/Hip Exercises: Machines for Strengthening   Cybex Knee Extension 20# 2x10 BLE    Cybex Knee Flexion 35# 2x10 BLE    Cybex Leg Press 40# 2x10 BLE, 40# heel raises 2x10       Knee/Hip Exercises: Standing   Rocker Board 1 minute    Other Standing Knee Exercises resisted gait x3 50# each direction      Manual Therapy   Manual Therapy Passive ROM;Soft tissue mobilization    Soft tissue mobilization metatarsals    Passive ROM RT ankle and foot                     PT Short Term Goals - 10/11/19 1055      PT SHORT TERM GOAL #1   Title  Pt will be independent with HEP    Status Achieved             PT Long Term Goals - 10/05/19 1203      PT LONG TERM GOAL #1   Title Pt will demonstrate ankle ROM WFL and painfree    Time 8    Period Weeks    Status New    Target Date 11/30/19      PT LONG TERM GOAL #2   Title Pt will report ability to sit through client therapy sessions with no increase in LBP    Time 8    Period Weeks    Status New    Target Date 11/30/19      PT LONG TERM GOAL #3   Title Pt will report ability to drive to/from work with no increase in RLE or LB pain    Time 8    Period Weeks    Status New  Target Date 11/30/19      PT LONG TERM GOAL #4   Title Pt will report resolution of R knee pain with weightbearing activities    Time 8    Period Weeks    Target Date 11/30/19                 Plan - 10/15/19 1614    Clinical Impression Statement Pt reports she is doing well with progression of HEP. Pt requested advice on ergonomic positions in office and on shoes/inserts; follow up next rx. Pt tolerated progression of ex's well; CGA required for resisted gait. Pt reports relief with STM to R ankle.    PT Treatment/Interventions ADLs/Self Care Home Management;Cryotherapy;Electrical Stimulation;Ultrasound;Moist Heat;Iontophoresis 4mg /ml Dexamethasone;Gait training;Stair training;Functional mobility training;Therapeutic activities;Therapeutic exercise;Neuromuscular re-education;Manual techniques;Patient/family education;Passive range of motion;Dry needling;Spinal Manipulations;Vasopneumatic Device;Taping    PT Next Visit Plan get pt moving. Pt experiencing pain in ankle, knee, hip, UT, and LB; assess primary focus based on pt presentation at next rx (today LB and ankle were main pt concerns)    Consulted and Agree with Plan of Care Patient           Patient will benefit from skilled therapeutic intervention in order to improve the following deficits and impairments:  Abnormal gait, Decreased  range of motion, Difficulty walking, Increased muscle spasms, Obesity, Pain, Hypomobility  Visit Diagnosis: Acute bilateral low back pain without sciatica  Muscle weakness (generalized)  Pain in right ankle and joints of right foot  Acute pain of right knee  Cramp and spasm     Problem List Patient Active Problem List   Diagnosis Date Noted  . Intramural leiomyoma of uterus 11/19/2015  . DUB (dysfunctional uterine bleeding) 09/05/2015  . Obesity (BMI 30-39.9) 03/19/2011   Amador Cunas, PT, DPT Donald Prose Philmore Lepore 10/15/2019, 4:16 PM  [address] [place], [place], [address] Phone: [phone]   Fax:  [phone]  Name: Agatha Duplechain MRN: [id number] Date of Birth: 1985-06-25

## 2019-10-18 ENCOUNTER — Ambulatory Visit: Payer: Self-pay | Admitting: Physical Therapy

## 2019-10-18 ENCOUNTER — Encounter: Payer: Self-pay | Admitting: Physical Therapy

## 2019-10-18 ENCOUNTER — Other Ambulatory Visit: Payer: Self-pay

## 2019-10-18 DIAGNOSIS — M25571 Pain in right ankle and joints of right foot: Secondary | ICD-10-CM

## 2019-10-18 DIAGNOSIS — M6281 Muscle weakness (generalized): Secondary | ICD-10-CM

## 2019-10-18 DIAGNOSIS — M545 Low back pain, unspecified: Secondary | ICD-10-CM

## 2019-10-18 DIAGNOSIS — R252 Cramp and spasm: Secondary | ICD-10-CM

## 2019-10-18 DIAGNOSIS — M25561 Pain in right knee: Secondary | ICD-10-CM

## 2019-10-18 NOTE — Therapy (Signed)
[place], [place], [address] Phone: [phone]   Fax:  [phone]  Physical Therapy Treatment  Patient Details  Name: Cassie Duncan MRN: [id number] Date of Birth: 1985/06/29 Referring Provider (PT): Delia Chimes   Encounter Date: 10/18/2019   PT End of Session - 10/18/19 1028    Visit Number 4    Date for PT Re-Evaluation 12/05/19    PT Start Time 0930    PT Stop Time 0938    PT Time Calculation (min) 45 min    Activity Tolerance Patient tolerated treatment well    Behavior During Therapy [facility] for tasks assessed/performed           History reviewed. No pertinent past medical history.  Past Surgical History:  Procedure Laterality Date  . WISDOM TOOTH EXTRACTION  2005    There were no vitals filed for this visit.   Subjective Assessment - 10/18/19 0937    Subjective Pt stated that ex's are feeling good and she is feeling just a little better overall    Currently in Pain? Yes    Pain Score 6     Pain Location Ankle    Pain Score 7    Pain Location Knee    Pain Score 4    Pain Location Back                             [facility] Adult PT Treatment/Exercise - 10/18/19 0001      Lumbar Exercises: Aerobic   Nustep L5 x 7 min      Knee/Hip Exercises: Stretches   Gastroc Stretch Both;60 seconds    Soleus Stretch Both;60 seconds      Knee/Hip Exercises: Machines for Strengthening   Other Machine rows and lats 20# 2x10      Knee/Hip Exercises: Standing   Heel Raises Both;1 set;15 reps    Rocker Board 1 minute    Other Standing Knee Exercises resisted gait x5 50# each direction      Modalities   Modalities Moist Heat      Moist Heat Therapy   Number Minutes Moist Heat 12 Minutes    Moist Heat Location Lumbar Spine      Manual Therapy   Manual Therapy Passive ROM;Soft tissue mobilization    Soft tissue mobilization metatarsals    Passive ROM RT ankle and  foot                     PT Short Term Goals - 10/11/19 1055      PT SHORT TERM GOAL #1   Title Pt will be independent with HEP    Status Achieved             PT Long Term Goals - 10/05/19 1203      PT LONG TERM GOAL #1   Title Pt will demonstrate ankle ROM WFL and painfree    Time 8    Period Weeks    Status New    Target Date 11/30/19      PT LONG TERM GOAL #2   Title Pt will report ability to sit through client therapy sessions with no increase in LBP    Time 8    Period Weeks    Status New    Target Date 11/30/19      PT LONG TERM GOAL #3   Title Pt will report ability  to drive to/from work with no increase in RLE or LB pain    Time 8    Period Weeks    Status New    Target Date 11/30/19      PT LONG TERM GOAL #4   Title Pt will report resolution of R knee pain with weightbearing activities    Time 8    Period Weeks    Target Date 11/30/19                 Plan - 10/18/19 1029    Clinical Impression Statement Pt tolerated progression of TE well; some compensation and hip weakness noted with side stepping. Pt has not had chance to try out shoes/inserts since last rx. Pt reports relief with STM to R ankle and heat to LB applied during manual tx.    PT Treatment/Interventions ADLs/Self Care Home Management;Cryotherapy;Electrical Stimulation;Ultrasound;Moist Heat;Iontophoresis 4mg /ml Dexamethasone;Gait training;Stair training;Functional mobility training;Therapeutic activities;Therapeutic exercise;Neuromuscular re-education;Manual techniques;Patient/family education;Passive range of motion;Dry needling;Spinal Manipulations;Vasopneumatic Device;Taping    PT Next Visit Plan get pt moving. Pt experiencing pain in ankle, knee, hip, UT, and LB; assess primary focus based on pt presentation at next rx (today LB and ankle were main pt concerns)    Consulted and Agree with Plan of Care Patient           Patient will benefit from skilled therapeutic  intervention in order to improve the following deficits and impairments:  Abnormal gait, Decreased range of motion, Difficulty walking, Increased muscle spasms, Obesity, Pain, Hypomobility  Visit Diagnosis: Acute bilateral low back pain without sciatica  Muscle weakness (generalized)  Pain in right ankle and joints of right foot  Acute pain of right knee  Cramp and spasm     Problem List Patient Active Problem List   Diagnosis Date Noted  . Intramural leiomyoma of uterus 11/19/2015  . DUB (dysfunctional uterine bleeding) 09/05/2015  . Obesity (BMI 30-39.9) 03/19/2011   Amador Cunas, PT, DPT [name] 10/18/2019, 10:34 AM  [address] [address] [place], [place], [address] Phone: [phone]   Fax:  [phone]  Name: Cassie Duncan MRN: [id number] Date of Birth: 04/11/1986

## 2019-10-22 ENCOUNTER — Encounter: Payer: Self-pay | Admitting: Physical Therapy

## 2019-10-22 ENCOUNTER — Other Ambulatory Visit: Payer: Self-pay

## 2019-10-22 ENCOUNTER — Ambulatory Visit: Payer: Self-pay | Admitting: Physical Therapy

## 2019-10-22 DIAGNOSIS — M25561 Pain in right knee: Secondary | ICD-10-CM

## 2019-10-22 DIAGNOSIS — R252 Cramp and spasm: Secondary | ICD-10-CM

## 2019-10-22 DIAGNOSIS — M25571 Pain in right ankle and joints of right foot: Secondary | ICD-10-CM

## 2019-10-22 DIAGNOSIS — M6281 Muscle weakness (generalized): Secondary | ICD-10-CM

## 2019-10-22 DIAGNOSIS — M545 Low back pain, unspecified: Secondary | ICD-10-CM

## 2019-10-22 NOTE — Therapy (Signed)
[place], [place], [address] Phone: [phone]   Fax:  [phone]  Physical Therapy Treatment  Patient Details  Name: Cassie Duncan MRN: [id number] Date of Birth: 03-12-1986 Referring Provider (PT): Delia Chimes   Encounter Date: 10/22/2019   PT End of Session - 10/22/19 1400    Visit Number 5    Date for PT Re-Evaluation 12/05/19    PT Start Time 1315    PT Stop Time 1358    PT Time Calculation (min) 43 min    Activity Tolerance Patient tolerated treatment well    Behavior During Therapy [facility] for tasks assessed/performed           History reviewed. No pertinent past medical history.  Past Surgical History:  Procedure Laterality Date  . WISDOM TOOTH EXTRACTION  2005    There were no vitals filed for this visit.   Subjective Assessment - 10/22/19 1318    Subjective Pt reports feeling a little better    Currently in Pain? Yes    Pain Score 5     Pain Location Ankle    Pain Orientation Right    Pain Score 6    Pain Location Knee    Pain Orientation Right    Pain Score 4    Pain Location Back                             [facility] Adult PT Treatment/Exercise - 10/22/19 0001      Lumbar Exercises: Aerobic   Nustep L5 x 7 min      Knee/Hip Exercises: Machines for Strengthening   Cybex Knee Extension 20# 2x10 BLE    Cybex Knee Flexion 35# 2x10 BLE    Cybex Leg Press 40# 2x10 BLE, 20# 2x10 RLE, 40# heel raises 2x10     Other Machine rows and lats 25# 2x10, standing ext 10# 2x10      Knee/Hip Exercises: Standing   Other Standing Knee Exercises SLS to chair with mat 6# weight and toe tap in between for balance 2x10      Ankle Exercises: Standing   SLS SLS with vectors B 3x3    Heel Raises Both;15 reps      Ankle Exercises: Stretches   Soleus Stretch 1 rep;60 seconds    Gastroc Stretch 30 seconds;1 rep;60 seconds                    PT Short Term  Goals - 10/11/19 1055      PT SHORT TERM GOAL #1   Title Pt will be independent with HEP    Status Achieved             PT Long Term Goals - 10/05/19 1203      PT LONG TERM GOAL #1   Title Pt will demonstrate ankle ROM WFL and painfree    Time 8    Period Weeks    Status New    Target Date 11/30/19      PT LONG TERM GOAL #2   Title Pt will report ability to sit through client therapy sessions with no increase in LBP    Time 8    Period Weeks    Status New    Target Date 11/30/19      PT LONG TERM GOAL #3   Title Pt will report ability to drive to/from work with no  increase in RLE or LB pain    Time 8    Period Weeks    Status New    Target Date 11/30/19      PT LONG TERM GOAL #4   Title Pt will report resolution of R knee pain with weightbearing activities    Time 8    Period Weeks    Target Date 11/30/19                 Plan - 10/22/19 1400    Clinical Impression Statement Pt demonstrates instability in SLS on RLE with vectors; improved with each set. Some LOB, able to self correct. Pt unable to complete single leg heel raises in standing d/t reports of R ankle pain; able to complete on leg press instead. Doing well with progression of TE; manual tx for R ankle next rx.    PT Treatment/Interventions ADLs/Self Care Home Management;Cryotherapy;Electrical Stimulation;Ultrasound;Moist Heat;Iontophoresis 4mg /ml Dexamethasone;Gait training;Stair training;Functional mobility training;Therapeutic activities;Therapeutic exercise;Neuromuscular re-education;Manual techniques;Patient/family education;Passive range of motion;Dry needling;Spinal Manipulations;Vasopneumatic Device;Taping    PT Next Visit Plan get pt moving. Pt experiencing pain in ankle, knee, hip, UT, and LB; assess primary focus based on pt presentation at next rx (today LB and ankle were main pt concerns)    Consulted and Agree with Plan of Care Patient           Patient will benefit from skilled  therapeutic intervention in order to improve the following deficits and impairments:  Abnormal gait, Decreased range of motion, Difficulty walking, Increased muscle spasms, Obesity, Pain, Hypomobility  Visit Diagnosis: Acute bilateral low back pain without sciatica  Muscle weakness (generalized)  Pain in right ankle and joints of right foot  Acute pain of right knee  Cramp and spasm     Problem List Patient Active Problem List   Diagnosis Date Noted  . Intramural leiomyoma of uterus 11/19/2015  . DUB (dysfunctional uterine bleeding) 09/05/2015  . Obesity (BMI 30-39.9) 03/19/2011   Amador Cunas, PT, DPT Donald Prose Patryce Depriest 10/22/2019, 2:02 PM  [place] [address] [place], [place], [address] Phone: [phone]   Fax:  [phone]  Name: Cassie Duncan MRN: [id number] Date of Birth: Jan 09, 1986

## 2019-10-25 ENCOUNTER — Encounter: Payer: Self-pay | Admitting: Physical Therapy

## 2019-10-25 ENCOUNTER — Ambulatory Visit: Payer: Self-pay | Admitting: Physical Therapy

## 2019-10-25 ENCOUNTER — Other Ambulatory Visit: Payer: Self-pay

## 2019-10-25 DIAGNOSIS — M6281 Muscle weakness (generalized): Secondary | ICD-10-CM

## 2019-10-25 DIAGNOSIS — M25571 Pain in right ankle and joints of right foot: Secondary | ICD-10-CM

## 2019-10-25 DIAGNOSIS — R252 Cramp and spasm: Secondary | ICD-10-CM

## 2019-10-25 DIAGNOSIS — M545 Low back pain, unspecified: Secondary | ICD-10-CM

## 2019-10-25 DIAGNOSIS — M25561 Pain in right knee: Secondary | ICD-10-CM

## 2019-10-25 NOTE — Therapy (Signed)
[place], [place], [address] Phone: [phone]   Fax:  [phone]  Physical Therapy Treatment  Patient Details  Name: Cassie Duncan MRN: [id number] Date of Birth: 21-Apr-1986 Referring Provider (PT): Delia Chimes   Encounter Date: 10/25/2019   PT End of Session - 10/25/19 1059    Visit Number 6    Date for PT Re-Evaluation 12/05/19    PT Start Time 0630    PT Stop Time 1100    PT Time Calculation (min) 45 min    Activity Tolerance Patient tolerated treatment well    Behavior During Therapy [facility] for tasks assessed/performed           History reviewed. No pertinent past medical history.  Past Surgical History:  Procedure Laterality Date  . WISDOM TOOTH EXTRACTION  2005    There were no vitals filed for this visit.   Subjective Assessment - 10/25/19 1022    Subjective Pt reports feeling good this morning    Currently in Pain? Yes    Pain Score 4     Pain Location Ankle    Pain Orientation Right    Pain Score 4    Pain Location Knee    Pain Orientation Right    Pain Score 5                             [facility] Adult PT Treatment/Exercise - 10/25/19 0001      Lumbar Exercises: Aerobic   Nustep L5 x 7 min      Knee/Hip Exercises: Machines for Strengthening   Cybex Knee Extension 20# 2x10 BLE    Cybex Knee Flexion 35# 2x15 BLE    Cybex Leg Press 40# 2x15 BLE, 20# 2x15 RLE, 40# heel raises 2x15       Manual Therapy   Manual Therapy Passive ROM;Soft tissue mobilization;Joint mobilization    Joint Mobilization ankle mobs to increase DF    Soft tissue mobilization metatarsals    Passive ROM RT ankle and foot                     PT Short Term Goals - 10/11/19 1055      PT SHORT TERM GOAL #1   Title Pt will be independent with HEP    Status Achieved             PT Long Term Goals - 10/05/19 1203      PT LONG TERM GOAL #1   Title Pt will  demonstrate ankle ROM WFL and painfree    Time 8    Period Weeks    Status New    Target Date 11/30/19      PT LONG TERM GOAL #2   Title Pt will report ability to sit through client therapy sessions with no increase in LBP    Time 8    Period Weeks    Status New    Target Date 11/30/19      PT LONG TERM GOAL #3   Title Pt will report ability to drive to/from work with no increase in RLE or LB pain    Time 8    Period Weeks    Status New    Target Date 11/30/19      PT LONG TERM GOAL #4   Title Pt will report resolution of R knee pain with weightbearing activities  Time 8    Period Weeks    Target Date 11/30/19                 Plan - 10/25/19 1059    Clinical Impression Statement Pt requested gastroc stretching in between most ex's d/t increased tightness in calf. Pt reported no increases in ankle/knee/LBP with exercise today. Pt demonstrates increased ankle mobility with STM and ankle mobs. Continue to progress next rx.    PT Treatment/Interventions ADLs/Self Care Home Management;Cryotherapy;Electrical Stimulation;Ultrasound;Moist Heat;Iontophoresis 4mg /ml Dexamethasone;Gait training;Stair training;Functional mobility training;Therapeutic activities;Therapeutic exercise;Neuromuscular re-education;Manual techniques;Patient/family education;Passive range of motion;Dry needling;Spinal Manipulations;Vasopneumatic Device;Taping    PT Next Visit Plan get pt moving. Pt experiencing pain in ankle, knee, hip, UT, and LB; assess primary focus based on pt presentation at next rx (today LB and ankle were main pt concerns)    Consulted and Agree with Plan of Care Patient           Patient will benefit from skilled therapeutic intervention in order to improve the following deficits and impairments:  Abnormal gait, Decreased range of motion, Difficulty walking, Increased muscle spasms, Obesity, Pain, Hypomobility  Visit Diagnosis: Acute bilateral low back pain without  sciatica  Muscle weakness (generalized)  Pain in right ankle and joints of right foot  Acute pain of right knee  Cramp and spasm     Problem List Patient Active Problem List   Diagnosis Date Noted  . Intramural leiomyoma of uterus 11/19/2015  . DUB (dysfunctional uterine bleeding) 09/05/2015  . Obesity (BMI 30-39.9) 03/19/2011   Amador Cunas, PT, DPT Donald Prose Braddock [place]on 10/25/2019, 11:01 AM  [address] [address] [place], [place], [address] Phone: [phone]   Fax:  [phone]  Name: Cassie Duncan MRN: [id number] Date of Birth: 1985/05/22

## 2019-10-29 ENCOUNTER — Encounter: Payer: Self-pay | Admitting: Physical Therapy

## 2019-10-29 ENCOUNTER — Ambulatory Visit: Payer: Self-pay | Admitting: Physical Therapy

## 2019-10-29 ENCOUNTER — Other Ambulatory Visit: Payer: Self-pay

## 2019-10-29 DIAGNOSIS — M545 Low back pain, unspecified: Secondary | ICD-10-CM

## 2019-10-29 DIAGNOSIS — M6281 Muscle weakness (generalized): Secondary | ICD-10-CM

## 2019-10-29 DIAGNOSIS — M25571 Pain in right ankle and joints of right foot: Secondary | ICD-10-CM

## 2019-10-29 DIAGNOSIS — M25561 Pain in right knee: Secondary | ICD-10-CM

## 2019-10-29 DIAGNOSIS — R252 Cramp and spasm: Secondary | ICD-10-CM

## 2019-10-29 NOTE — Therapy (Signed)
[place], [place], [address] Phone: [phone]   Fax:  [phone]  Physical Therapy Treatment  Patient Details  Name: Cassie Duncan MRN: [id number] Date of Birth: 1986-04-02 Referring Provider (PT): Delia Chimes   Encounter Date: 10/29/2019   PT End of Session - 10/29/19 1359    Visit Number 7    Date for PT Re-Evaluation 12/05/19    PT Start Time 1321    PT Stop Time 1400    PT Time Calculation (min) 39 min    Activity Tolerance Patient tolerated treatment well    Behavior During Therapy [facility] for tasks assessed/performed           History reviewed. No pertinent past medical history.  Past Surgical History:  Procedure Laterality Date  . WISDOM TOOTH EXTRACTION  2005    There were no vitals filed for this visit.   Subjective Assessment - 10/29/19 1324    Subjective Pt reports she is feeling okay this rx.    Currently in Pain? Yes    Pain Score 6     Pain Location Ankle    Pain Orientation Right    Pain Score 6    Pain Location Knee    Pain Orientation Right    Pain Score 4    Pain Location Back    Pain Orientation Lower                             [facility] Adult PT Treatment/Exercise - 10/29/19 0001      Lumbar Exercises: Aerobic   Nustep L5 x 7 min      Knee/Hip Exercises: Machines for Strengthening   Cybex Knee Extension 20# 2x10 BLE    Cybex Knee Flexion 35# 2x10 BLE    Hip Cybex resisted gait 40# x5 each direction    Other Machine rows and lats 25# 2x10, standing ext 10# 2x10      Manual Therapy   Manual Therapy Passive ROM;Soft tissue mobilization;Joint mobilization    Joint Mobilization ankle mobs to increase DF    Soft tissue mobilization metatarsals    Passive ROM RT ankle and foot       Ankle Exercises: Stretches   Soleus Stretch 1 rep;60 seconds    Gastroc Stretch 30 seconds;1 rep;60 seconds                    PT Short Term  Goals - 10/11/19 1055      PT SHORT TERM GOAL #1   Title Pt will be independent with HEP    Status Achieved             PT Long Term Goals - 10/05/19 1203      PT LONG TERM GOAL #1   Title Pt will demonstrate ankle ROM WFL and painfree    Time 8    Period Weeks    Status New    Target Date 11/30/19      PT LONG TERM GOAL #2   Title Pt will report ability to sit through client therapy sessions with no increase in LBP    Time 8    Period Weeks    Status New    Target Date 11/30/19      PT LONG TERM GOAL #3   Title Pt will report ability to drive to/from work with no increase in RLE or LB pain  Time 8    Period Weeks    Status New    Target Date 11/30/19      PT LONG TERM GOAL #4   Title Pt will report resolution of R knee pain with weightbearing activities    Time 8    Period Weeks    Target Date 11/30/19                 Plan - 10/29/19 1403    Clinical Impression Statement Pt demonstrated increased pain with exercise in clinic today; pt states she did a lot of driving this weekend for errands. Pt responded well to STM and ankle mobs. Continue to progress next rx.    PT Treatment/Interventions ADLs/Self Care Home Management;Cryotherapy;Electrical Stimulation;Ultrasound;Moist Heat;Iontophoresis 4mg /ml Dexamethasone;Gait training;Stair training;Functional mobility training;Therapeutic activities;Therapeutic exercise;Neuromuscular re-education;Manual techniques;Patient/family education;Passive range of motion;Dry needling;Spinal Manipulations;Vasopneumatic Device;Taping    PT Next Visit Plan get pt moving. Pt experiencing pain in ankle, knee, hip, UT, and LB; assess primary focus based on pt presentation at next rx (today LB and ankle were main pt concerns)    Consulted and Agree with Plan of Care Patient           Patient will benefit from skilled therapeutic intervention in order to improve the following deficits and impairments:  Abnormal gait, Decreased  range of motion, Difficulty walking, Increased muscle spasms, Obesity, Pain, Hypomobility  Visit Diagnosis: Acute bilateral low back pain without sciatica  Muscle weakness (generalized)  Pain in right ankle and joints of right foot  Acute pain of right knee  Cramp and spasm     Problem List Patient Active Problem List   Diagnosis Date Noted  . Intramural leiomyoma of uterus 11/19/2015  . DUB (dysfunctional uterine bleeding) 09/05/2015  . Obesity (BMI 30-39.9) 03/19/2011   Amador Cunas, PT, DPT Donald Prose Will[place] Reczek 10/29/2019, 2:05 PM  [place] [address] [place], [place], [address] Phone: [phone]   Fax:  [phone]  Name: Cassie Duncan MRN: [id number] Date of Birth: [birth year]

## 2019-11-01 ENCOUNTER — Other Ambulatory Visit: Payer: Self-pay

## 2019-11-01 ENCOUNTER — Ambulatory Visit: Payer: Self-pay | Attending: Internal Medicine | Admitting: Physical Therapy

## 2019-11-01 ENCOUNTER — Encounter: Payer: Self-pay | Admitting: Physical Therapy

## 2019-11-01 DIAGNOSIS — M25571 Pain in right ankle and joints of right foot: Secondary | ICD-10-CM | POA: Insufficient documentation

## 2019-11-01 DIAGNOSIS — M6281 Muscle weakness (generalized): Secondary | ICD-10-CM | POA: Insufficient documentation

## 2019-11-01 DIAGNOSIS — M545 Low back pain, unspecified: Secondary | ICD-10-CM

## 2019-11-01 DIAGNOSIS — R252 Cramp and spasm: Secondary | ICD-10-CM | POA: Insufficient documentation

## 2019-11-01 DIAGNOSIS — M25561 Pain in right knee: Secondary | ICD-10-CM | POA: Insufficient documentation

## 2019-11-01 NOTE — Therapy (Signed)
[place], [place], [address] Phone: [phone]   Fax:  [phone]  Physical Therapy Treatment  Patient Details  Name: Cassie Duncan MRN: [id number] Date of Birth: 1985-07-30 Referring Provider (PT): Delia Chimes   Encounter Date: 11/01/2019   PT End of Session - 11/01/19 1355    Visit Number 8    Date for PT Re-Evaluation 12/05/19    PT Start Time 9211    PT Stop Time 9417    PT Time Calculation (min) 47 min    Activity Tolerance Patient tolerated treatment well    Behavior During Therapy [facility] for tasks assessed/performed           History reviewed. No pertinent past medical history.  Past Surgical History:  Procedure Laterality Date   WISDOM TOOTH EXTRACTION  2005    There were no vitals filed for this visit.   Subjective Assessment - 11/01/19 1309    Subjective Pt reports ankle is feeling better while driving    Currently in Pain? Yes    Pain Score 5     Pain Location Ankle    Pain Orientation Right    Pain Score 4    Pain Location Knee    Pain Orientation Right    Pain Score 3    Pain Location Back    Pain Orientation Lower                             [facility] Adult PT Treatment/Exercise - 11/01/19 0001      Lumbar Exercises: Aerobic   Recumbent Bike x6 min      Lumbar Exercises: Standing   Other Standing Lumbar Exercises standing lumbar ext over exercise ball 5x 5sec hold    Other Standing Lumbar Exercises mini squats with exercise ball x10 5 sec hold; squats with 10# dumbell 2x10      Knee/Hip Exercises: Stretches   Gastroc Stretch Both;60 seconds    Soleus Stretch Both;60 seconds      Knee/Hip Exercises: Machines for Strengthening   Other Machine rows and lats 25# 2x10; lumbar ext black TB x15; standing shoulder ext 10# 2x15      Knee/Hip Exercises: Standing   Heel Raises Both;15 reps;2 sets    Hip Abduction Both;1 set;20 reps    Abduction  Limitations green TB around feet    Step Down Both;10 reps;Hand Hold: 1;Step Height: 4";2 sets    Other Standing Knee Exercises resisted gait x5 50# each direction                    PT Short Term Goals - 10/11/19 1055      PT SHORT TERM GOAL #1   Title Pt will be independent with HEP    Status Achieved             PT Long Term Goals - 10/05/19 1203      PT LONG TERM GOAL #1   Title Pt will demonstrate ankle ROM WFL and painfree    Time 8    Period Weeks    Status New    Target Date 11/30/19      PT LONG TERM GOAL #2   Title Pt will report ability to sit through client therapy sessions with no increase in LBP    Time 8    Period Weeks    Status New    Target Date  11/30/19      PT LONG TERM GOAL #3   Title Pt will report ability to drive to/from work with no increase in RLE or LB pain    Time 8    Period Weeks    Status New    Target Date 11/30/19      PT LONG TERM GOAL #4   Title Pt will report resolution of R knee pain with weightbearing activities    Time 8    Period Weeks    Target Date 11/30/19                 Plan - 11/01/19 1355    Clinical Impression Statement Pt doing better in clinic today; able to tolerate more standing ex's and ex's of increased depth like squats/stepdowns with no complaints of increased RLE pain. Cues for proper form with squats. Pt reports she is having progressively less ankle pain with driving but is still having trouble in stop and go traffic. Continue to progress.    PT Treatment/Interventions ADLs/Self Care Home Management;Cryotherapy;Electrical Stimulation;Ultrasound;Moist Heat;Iontophoresis 4mg /ml Dexamethasone;Gait training;Stair training;Functional mobility training;Therapeutic activities;Therapeutic exercise;Neuromuscular re-education;Manual techniques;Patient/family education;Passive range of motion;Dry needling;Spinal Manipulations;Vasopneumatic Device;Taping    PT Next Visit Plan address pt pain, LE  strengthening/flexibility, ankle stab, manual as indicated    Consulted and Agree with Plan of Care Patient           Patient will benefit from skilled therapeutic intervention in order to improve the following deficits and impairments:  Abnormal gait, Decreased range of motion, Difficulty walking, Increased muscle spasms, Obesity, Pain, Hypomobility  Visit Diagnosis: Acute bilateral low back pain without sciatica  Muscle weakness (generalized)  Pain in right ankle and joints of right foot  Acute pain of right knee  Cramp and spasm     Problem List Patient Active Problem List   Diagnosis Date Noted   Intramural leiomyoma of uterus 11/19/2015   DUB (dysfunctional uterine bleeding) 09/05/2015   Obesity (BMI 30-39.9) 03/19/2011   Amador Cunas, PT, DPT Donald Prose Mahesh Sizemore 11/01/2019, 1:58 PM  [place], [place], [address] Phone: [phone]   Fax:  [phone]  Name: Cassie Duncan MRN: [id number] Date of Birth: Oct 06, 1985

## 2019-11-06 ENCOUNTER — Ambulatory Visit: Payer: Self-pay | Admitting: Physical Therapy

## 2019-11-06 ENCOUNTER — Other Ambulatory Visit: Payer: Self-pay

## 2019-11-06 DIAGNOSIS — M545 Low back pain, unspecified: Secondary | ICD-10-CM

## 2019-11-06 DIAGNOSIS — M6281 Muscle weakness (generalized): Secondary | ICD-10-CM

## 2019-11-06 DIAGNOSIS — M25571 Pain in right ankle and joints of right foot: Secondary | ICD-10-CM

## 2019-11-06 NOTE — Therapy (Signed)
[place] [facility] [place], [place], [address] Phone: [phone]   Fax:  [phone]  Physical Therapy Treatment  Patient Details  Name: Cassie Duncan MRN: [id number] Date of Birth: [birth year] Referring Provider (PT): Delia Chimes   Encounter Date: 11/06/2019   PT End of Session - 11/06/19 1014    Visit Number 9    Date for PT Re-Evaluation 12/05/19    PT Start Time 0930    PT Stop Time 1015    PT Time Calculation (min) 45 min           No past medical history on file.  Past Surgical History:  Procedure Laterality Date  . WISDOM TOOTH EXTRACTION  2005    There were no vitals filed for this visit.   Subjective Assessment - 11/06/19 0933    Subjective I was doing so well but increased in town driving over weekend and so increased pain ( through week more highway miles) back,knee and hip 5/10-better    Currently in Pain? Yes    Pain Score 7     Pain Location Ankle    Pain Orientation Right                             [facility] Adult PT Treatment/Exercise - 11/06/19 0001      Lumbar Exercises: Aerobic   Elliptical 2 min fwd/ 2 min back I 5 R 4    Tread Mill OFF push /pull 15 each RT      Lumbar Exercises: Machines for Strengthening   Cybex Lumbar Extension black tband 2 sets 15    Other Lumbar Machine Exercise lats and rows 15# 2 sets 15      Knee/Hip Exercises: Standing   Step Down Right;10 reps;Hand Hold: 0;Step Height: 6"    Other Standing Knee Exercises resisted gait x5 50# each direction    Other Standing Knee Exercises 6 inch side stepping over box 5 each way      Knee/Hip Exercises: Seated   Sit to Sand 15 reps;without UE support   wt ball OH on foam mat     Manual Therapy   Manual Therapy Passive ROM;Soft tissue mobilization;Joint mobilization    Joint Mobilization ankle mobs to increase DF    Soft tissue mobilization metatarsals    Passive ROM RT ankle and foot        Ankle Exercises: Standing   Heel Raises 20 reps   on foam mat   Toe Raise 20 reps   on foam mat   Other Standing Ankle Exercises WBAT on dyna disc 4 way 15 times each      Ankle Exercises: Seated   Other Seated Ankle Exercises green tband 20 times 4 ways                    PT Short Term Goals - 10/11/19 1055      PT SHORT TERM GOAL #1   Title Pt will be independent with HEP    Status Achieved             PT Long Term Goals - 10/05/19 1203      PT LONG TERM GOAL #1   Title Pt will demonstrate ankle ROM WFL and painfree    Time 8    Period Weeks    Status New    Target Date 11/30/19      PT  LONG TERM GOAL #2   Title Pt will report ability to sit through client therapy sessions with no increase in LBP    Time 8    Period Weeks    Status New    Target Date 11/30/19      PT LONG TERM GOAL #3   Title Pt will report ability to drive to/from work with no increase in RLE or LB pain    Time 8    Period Weeks    Status New    Target Date 11/30/19      PT LONG TERM GOAL #4   Title Pt will report resolution of R knee pain with weightbearing activities    Time 8    Period Weeks    Target Date 11/30/19                 Plan - 11/06/19 1015    Clinical Impression Statement pt tolerated progression of ther ex well. increased mobility noted in RT ankle and foot and MT. pt verb improvement but still issues with foot pain after increasewd driving.    PT Treatment/Interventions ADLs/Self Care Home Management;Cryotherapy;Electrical Stimulation;Ultrasound;Moist Heat;Iontophoresis 4mg /ml Dexamethasone;Gait training;Stair training;Functional mobility training;Therapeutic activities;Therapeutic exercise;Neuromuscular re-education;Manual techniques;Patient/family education;Passive range of motion;Dry needling;Spinal Manipulations;Vasopneumatic Device;Taping    PT Next Visit Plan assess goals           Patient will benefit from skilled therapeutic intervention in  order to improve the following deficits and impairments:  Abnormal gait, Decreased range of motion, Difficulty walking, Increased muscle spasms, Obesity, Pain, Hypomobility  Visit Diagnosis: Acute bilateral low back pain without sciatica  Muscle weakness (generalized)  Pain in right ankle and joints of right foot     Problem List Patient Active Problem List   Diagnosis Date Noted  . Intramural leiomyoma of uterus 11/19/2015  . DUB (dysfunctional uterine bleeding) 09/05/2015  . Obesity (BMI 30-39.9) 03/19/2011    Fabian Walder,ANGIE PTA 11/06/2019, 10:17 AM  [address] [place], [place], [address] Phone: [phone]   Fax:  [phone]  Name: Cassie Duncan MRN: [id number] Date of Birth: 31-Jan-1986

## 2019-11-08 ENCOUNTER — Encounter: Payer: Self-pay | Admitting: Physical Therapy

## 2019-11-08 ENCOUNTER — Other Ambulatory Visit: Payer: Self-pay

## 2019-11-08 ENCOUNTER — Ambulatory Visit: Payer: Self-pay | Admitting: Physical Therapy

## 2019-11-08 DIAGNOSIS — M25571 Pain in right ankle and joints of right foot: Secondary | ICD-10-CM

## 2019-11-08 DIAGNOSIS — M25561 Pain in right knee: Secondary | ICD-10-CM

## 2019-11-08 DIAGNOSIS — M545 Low back pain, unspecified: Secondary | ICD-10-CM

## 2019-11-08 DIAGNOSIS — M6281 Muscle weakness (generalized): Secondary | ICD-10-CM

## 2019-11-08 DIAGNOSIS — R252 Cramp and spasm: Secondary | ICD-10-CM

## 2019-11-08 NOTE — Therapy (Signed)
[place], [place], [address] Phone: [phone]   Fax:  [phone]  Physical Therapy Treatment Progress Note Reporting Period 10/05/2019 to 11/08/2019  See note below for Objective Data and Assessment of Progress/Goals.      Patient Details  Name: Cassie Duncan MRN: [id number] Date of Birth: 09-Jan-1986 Referring Provider (PT): Delia Chimes   Encounter Date: 11/08/2019   PT End of Session - 11/08/19 1024    Visit Number 10    Date for PT Re-Evaluation 12/05/19    PT Start Time 0935    PT Stop Time 1015    PT Time Calculation (min) 40 min    Activity Tolerance Patient tolerated treatment well    Behavior During Therapy [facility] for tasks assessed/performed           History reviewed. No pertinent past medical history.  Past Surgical History:  Procedure Laterality Date  . WISDOM TOOTH EXTRACTION  2005    There were no vitals filed for this visit.   Subjective Assessment - 11/08/19 0938    Subjective Pt reports ankle feeling a little better today since increased in town driving irritated it this past weekend; reports 5/10 pain in back/knee/ankle/hip    Currently in Pain? Yes    Pain Score 5     Pain Location Ankle    Pain Orientation Right              [facility] PT Assessment - 11/08/19 0001      ROM / Strength   AROM / PROM / Strength PROM      AROM   AROM Assessment Site Ankle    Right/Left Ankle Right;Left    Right Ankle Dorsiflexion -3    Right Ankle Plantar Flexion 30    Right Ankle Inversion 20    Right Ankle Eversion 10    Left Ankle Dorsiflexion 5    Left Ankle Plantar Flexion 35    Left Ankle Inversion 20    Left Ankle Eversion 15      PROM   PROM Assessment Site Ankle    Right/Left Ankle Right;Left    Right Ankle Dorsiflexion 0    Right Ankle Plantar Flexion 35    Right Ankle Inversion 25    Right Ankle Eversion 15    Left Ankle Dorsiflexion 7    Left Ankle  Plantar Flexion 40    Left Ankle Inversion 25    Left Ankle Eversion 15                         [facility] Adult PT Treatment/Exercise - 11/08/19 0001      Lumbar Exercises: Aerobic   Elliptical R5 6 min      Lumbar Exercises: Machines for Strengthening   Cybex Lumbar Extension black tband 2 sets 15    Other Lumbar Machine Exercise lats and rows 20# 2 sets 15      Manual Therapy   Manual Therapy Passive ROM;Soft tissue mobilization;Joint mobilization    Joint Mobilization ankle mobs to increase DF    Soft tissue mobilization metatarsals    Passive ROM RT ankle and foot       Ankle Exercises: Stretches   Soleus Stretch 1 rep;30 seconds    Gastroc Stretch 1 rep;30 seconds                    PT Short Term Goals - 10/11/19 1055  PT SHORT TERM GOAL #1   Title Pt will be independent with HEP    Status Achieved             PT Long Term Goals - 11/08/19 1028      PT LONG TERM GOAL #1   Title Pt will demonstrate ankle ROM WFL and painfree    Time 8    Period Weeks    Status On-going      PT LONG TERM GOAL #2   Title Pt will report ability to sit through client therapy sessions with no increase in LBP    Time 8    Period Weeks    Status On-going      PT LONG TERM GOAL #3   Title Pt will report ability to drive to/from work with no increase in RLE or LB pain    Time 8    Period Weeks    Status On-going      PT LONG TERM GOAL #4   Title Pt will report resolution of R knee pain with weightbearing activities    Time 8    Period Weeks    Status On-going                 Plan - 11/08/19 1024    Clinical Impression Statement Pt R ankle ROM improving; still demos deficits in R ankle DF with reports of ankle pain with stop and go traffic. Pt reports pain is improving overall and is making progress towards goals. Continue to push ROM/strength/function.    PT Treatment/Interventions ADLs/Self Care Home Management;Cryotherapy;Electrical  Stimulation;Ultrasound;Moist Heat;Iontophoresis 4mg /ml Dexamethasone;Gait training;Stair training;Functional mobility training;Therapeutic activities;Therapeutic exercise;Neuromuscular re-education;Manual techniques;Patient/family education;Passive range of motion;Dry needling;Spinal Manipulations;Vasopneumatic Device;Taping    PT Next Visit Plan continue to progress ROM/function    Consulted and Agree with Plan of Care Patient           Patient will benefit from skilled therapeutic intervention in order to improve the following deficits and impairments:  Abnormal gait, Decreased range of motion, Difficulty walking, Increased muscle spasms, Obesity, Pain, Hypomobility  Visit Diagnosis: Acute bilateral low back pain without sciatica  Muscle weakness (generalized)  Pain in right ankle and joints of right foot  Acute pain of right knee  Cramp and spasm     Problem List Patient Active Problem List   Diagnosis Date Noted  . Intramural leiomyoma of uterus 11/19/2015  . DUB (dysfunctional uterine bleeding) 09/05/2015  . Obesity (BMI 30-39.9) 03/19/2011   Amador Cunas, PT, DPT Donald Prose Augusto Deckman 11/08/2019, 10:30 AM  [address] [place], [place], [address] Phone: [phone]   Fax:  [phone]  Name: Allanah Mcfarland MRN: [id number] Date of Birth: 11-27-1985

## 2019-11-12 ENCOUNTER — Encounter: Payer: Self-pay | Admitting: Physical Therapy

## 2019-11-15 ENCOUNTER — Ambulatory Visit: Payer: Self-pay | Admitting: Physical Therapy

## 2019-11-15 ENCOUNTER — Other Ambulatory Visit: Payer: Self-pay

## 2019-11-15 DIAGNOSIS — M545 Low back pain, unspecified: Secondary | ICD-10-CM

## 2019-11-15 DIAGNOSIS — M25561 Pain in right knee: Secondary | ICD-10-CM

## 2019-11-15 DIAGNOSIS — M6281 Muscle weakness (generalized): Secondary | ICD-10-CM

## 2019-11-15 DIAGNOSIS — M25571 Pain in right ankle and joints of right foot: Secondary | ICD-10-CM

## 2019-11-15 DIAGNOSIS — R252 Cramp and spasm: Secondary | ICD-10-CM

## 2019-11-15 NOTE — Therapy (Signed)
[place] [facility] [place], [place], [address] Phone: [phone]   Fax:  [phone]  Physical Therapy Treatment  Patient Details  Name: Cassie Duncan MRN: [id number] Date of Birth: [birth year] Referring Provider (PT): Delia Chimes   Encounter Date: 11/15/2019   PT End of Session - 11/15/19 1014    Visit Number 11    Date for PT Re-Evaluation 12/05/19    PT Start Time 0935    PT Stop Time 1012    PT Time Calculation (min) 37 min           No past medical history on file.  Past Surgical History:  Procedure Laterality Date  . WISDOM TOOTH EXTRACTION  2005    There were no vitals filed for this visit.   Subjective Assessment - 11/15/19 0936    Subjective Patient reports feeling okay. She noticed her foot was sore walking into the building because she was hurrying in from being late.    Pain Score 5     Pain Orientation Right                             [facility] Adult PT Treatment/Exercise - 11/15/19 0001      Lumbar Exercises: Aerobic   Elliptical R 5 6 minutes      Lumbar Exercises: Machines for Strengthening   Cybex Lumbar Extension black tband 2 sets 15    Other Lumbar Machine Exercise shoulder extensions 2x15 10#      Lumbar Exercises: Standing   Other Standing Lumbar Exercises band pull aparts redTband 2 x 15      Knee/Hip Exercises: Standing   Step Down 2 sets;Both;Step Height: 4"   lateral heel drop; slight knee valgus BIL     Manual Therapy   Manual Therapy Passive ROM;Soft tissue mobilization;Joint mobilization    Joint Mobilization ankle mobs to increase DF    Soft tissue mobilization metatarsals    Passive ROM RT ankle and foot       Ankle Exercises: Standing   Toe Raise 20 reps                    PT Short Term Goals - 10/11/19 1055      PT SHORT TERM GOAL #1   Title Pt will be independent with HEP    Status Achieved             PT Long  Term Goals - 11/08/19 1028      PT LONG TERM GOAL #1   Title Pt will demonstrate ankle ROM WFL and painfree    Time 8    Period Weeks    Status On-going      PT LONG TERM GOAL #2   Title Pt will report ability to sit through client therapy sessions with no increase in LBP    Time 8    Period Weeks    Status On-going      PT LONG TERM GOAL #3   Title Pt will report ability to drive to/from work with no increase in RLE or LB pain    Time 8    Period Weeks    Status On-going      PT LONG TERM GOAL #4   Title Pt will report resolution of R knee pain with weightbearing activities    Time 8    Period Weeks    Status On-going  Plan - 11/15/19 1000    Clinical Impression Statement Patient tolerated TE well. VC for scapular depression during band pull parts. Patient with BIL slight knee valgus during SL heel drops, however, patient able to self correct. Patient still limited in DF and tenderness in R medial metatarsal area and L lateral foot as well.    Personal Factors and Comorbidities Fitness    Examination-Activity Limitations Bend;Carry;Lift;Stand;Stairs;Squat;Locomotion Level    Examination-Participation Restrictions Community Activity;Driving;Interpersonal Relationship    Rehab Potential Good    PT Frequency 2x / week    PT Treatment/Interventions ADLs/Self Care Home Management;Cryotherapy;Electrical Stimulation;Ultrasound;Moist Heat;Iontophoresis 4mg /ml Dexamethasone;Gait training;Stair training;Functional mobility training;Therapeutic activities;Therapeutic exercise;Neuromuscular re-education;Manual techniques;Patient/family education;Passive range of motion;Dry needling;Spinal Manipulations;Vasopneumatic Device;Taping    PT Next Visit Plan Hip strength, push ROM/function, address goals    Consulted and Agree with Plan of Care Patient           Patient will benefit from skilled therapeutic intervention in order to improve the following deficits and  impairments:  Abnormal gait, Decreased range of motion, Difficulty walking, Increased muscle spasms, Obesity, Pain, Hypomobility  Visit Diagnosis: Acute bilateral low back pain without sciatica  Muscle weakness (generalized)  Pain in right ankle and joints of right foot  Acute pain of right knee  Cramp and spasm     Problem List Patient Active Problem List   Diagnosis Date Noted  . Intramural leiomyoma of uterus 11/19/2015  . DUB (dysfunctional uterine bleeding) 09/05/2015  . Obesity (BMI 30-39.9) 03/19/2011    York Cerise, SPTA 11/15/2019, 10:15 AM  [address] [place], [place], [address] Phone: [phone]   Fax:  [phone]  Name: Cassie Duncan MRN: [id number] Date of Birth: Jul 02, 1985

## 2019-11-19 ENCOUNTER — Other Ambulatory Visit: Payer: Self-pay

## 2019-11-19 ENCOUNTER — Encounter: Payer: Self-pay | Admitting: Physical Therapy

## 2019-11-19 ENCOUNTER — Ambulatory Visit: Payer: Self-pay | Admitting: Physical Therapy

## 2019-11-19 DIAGNOSIS — M6281 Muscle weakness (generalized): Secondary | ICD-10-CM

## 2019-11-19 DIAGNOSIS — M25561 Pain in right knee: Secondary | ICD-10-CM

## 2019-11-19 DIAGNOSIS — M25571 Pain in right ankle and joints of right foot: Secondary | ICD-10-CM

## 2019-11-19 DIAGNOSIS — M545 Low back pain, unspecified: Secondary | ICD-10-CM

## 2019-11-19 DIAGNOSIS — R252 Cramp and spasm: Secondary | ICD-10-CM

## 2019-11-19 NOTE — Therapy (Signed)
[place], [place], [address] Phone: [phone]   Fax:  [phone]  Physical Therapy Treatment  Patient Details  Name: Cassie Duncan MRN: [id number] Date of Birth: 1985-09-29 Referring Provider (PT): Delia Chimes   Encounter Date: 11/19/2019   PT End of Session - 11/19/19 1014    Visit Number 12    Date for PT Re-Evaluation 12/05/19    PT Start Time 0931    PT Stop Time 1014    PT Time Calculation (min) 43 min    Activity Tolerance Patient tolerated treatment well    Behavior During Therapy [facility] for tasks assessed/performed           History reviewed. No pertinent past medical history.  Past Surgical History:  Procedure Laterality Date  . WISDOM TOOTH EXTRACTION  2005    There were no vitals filed for this visit.   Subjective Assessment - 11/19/19 0933    Subjective Pt reports feeling better overall; knee still about the same    Currently in Pain? Yes    Pain Score 4     Pain Location Ankle    Pain Orientation Right    Pain Score 4    Pain Location Knee    Pain Orientation Right    Pain Score 3    Pain Location Back                             [facility] Adult PT Treatment/Exercise - 11/19/19 0001      Lumbar Exercises: Aerobic   Elliptical R 5 6 minutes      Knee/Hip Exercises: Stretches   Gastroc Stretch Both;60 seconds    Soleus Stretch Both;60 seconds      Knee/Hip Exercises: Machines for Strengthening   Cybex Knee Extension 20# 2x15 BLE    Cybex Knee Flexion 35# 2x15 BLE    Cybex Leg Press 40# 2x15 BLE      Knee/Hip Exercises: Standing   Heel Raises Both;15 reps;2 sets    Other Standing Knee Exercises lunges to BOSU ball x10 B; sidestepping onto bosu x10 B (shorter step length to R d/t reports of pain)    Other Standing Knee Exercises push pull on treadmill x15 R                    PT Short Term Goals - 10/11/19 1055      PT SHORT  TERM GOAL #1   Title Pt will be independent with HEP    Status Achieved             PT Long Term Goals - 11/08/19 1028      PT LONG TERM GOAL #1   Title Pt will demonstrate ankle ROM WFL and painfree    Time 8    Period Weeks    Status On-going      PT LONG TERM GOAL #2   Title Pt will report ability to sit through client therapy sessions with no increase in LBP    Time 8    Period Weeks    Status On-going      PT LONG TERM GOAL #3   Title Pt will report ability to drive to/from work with no increase in RLE or LB pain    Time 8    Period Weeks    Status On-going      PT LONG TERM GOAL #  4   Title Pt will report resolution of R knee pain with weightbearing activities    Time 8    Period Weeks    Status On-going                 Plan - 11/19/19 1015    Clinical Impression Statement Pt tolerated progression of TE well. Some complaints of R ankle pain with lateral step ups onto BOSU ball; modified for shorter step length with decreased complaints of pain. Pt reports overall improvement in LBP and R ankle pain.    PT Treatment/Interventions ADLs/Self Care Home Management;Cryotherapy;Electrical Stimulation;Ultrasound;Moist Heat;Iontophoresis 4mg /ml Dexamethasone;Gait training;Stair training;Functional mobility training;Therapeutic activities;Therapeutic exercise;Neuromuscular re-education;Manual techniques;Patient/family education;Passive range of motion;Dry needling;Spinal Manipulations;Vasopneumatic Device;Taping    PT Next Visit Plan Hip strength, push ROM/function, address goals    Consulted and Agree with Plan of Care Patient           Patient will benefit from skilled therapeutic intervention in order to improve the following deficits and impairments:  Abnormal gait, Decreased range of motion, Difficulty walking, Increased muscle spasms, Obesity, Pain, Hypomobility  Visit Diagnosis: Acute bilateral low back pain without sciatica  Muscle weakness  (generalized)  Pain in right ankle and joints of right foot  Acute pain of right knee  Cramp and spasm     Problem List Patient Active Problem List   Diagnosis Date Noted  . Intramural leiomyoma of uterus 11/19/2015  . DUB (dysfunctional uterine bleeding) 09/05/2015  . Obesity (BMI 30-39.9) 03/19/2011   Amador Cunas, PT, DPT Donald Prose Demitris Pokorny 11/19/2019, 10:16 AM  [place] [address] [place], [place], [address] Phone: [phone]   Fax:  [phone]  Name: Cassie Duncan MRN: [id number] Date of Birth: 12-Mar-1986

## 2019-11-23 ENCOUNTER — Other Ambulatory Visit: Payer: Self-pay

## 2019-11-23 ENCOUNTER — Ambulatory Visit: Payer: Self-pay | Admitting: Physical Therapy

## 2019-11-23 ENCOUNTER — Encounter: Payer: Self-pay | Admitting: Physical Therapy

## 2019-11-23 DIAGNOSIS — M25561 Pain in right knee: Secondary | ICD-10-CM

## 2019-11-23 DIAGNOSIS — M545 Low back pain, unspecified: Secondary | ICD-10-CM

## 2019-11-23 DIAGNOSIS — M25571 Pain in right ankle and joints of right foot: Secondary | ICD-10-CM

## 2019-11-23 DIAGNOSIS — M6281 Muscle weakness (generalized): Secondary | ICD-10-CM

## 2019-11-23 DIAGNOSIS — R252 Cramp and spasm: Secondary | ICD-10-CM

## 2019-11-23 NOTE — Therapy (Signed)
[place], [place], [address] Phone: [phone]   Fax:  [phone]  Physical Therapy Treatment  Patient Details  Name: Cassie Duncan MRN: [id number] Date of Birth: [birth year] Referring Provider (PT): Delia Chimes   Encounter Date: 11/23/2019   PT End of Session - 11/23/19 1024    Visit Number 13    Date for PT Re-Evaluation 12/05/19    PT Start Time 0930    PT Stop Time 8756    PT Time Calculation (min) 45 min    Activity Tolerance Patient tolerated treatment well    Behavior During Therapy [facility] for tasks assessed/performed           History reviewed. No pertinent past medical history.  Past Surgical History:  Procedure Laterality Date   WISDOM TOOTH EXTRACTION  2005    There were no vitals filed for this visit.   Subjective Assessment - 11/23/19 0939    Subjective Pt reports ankle is a little sore after last session; LB is feeling much better    Currently in Pain? Yes    Pain Score 5     Pain Location Ankle    Pain Orientation Right    Pain Score 4    Pain Location Knee    Pain Orientation Right    Pain Score 0    Pain Location Back              [facility] PT Assessment - 11/23/19 0001      AROM   Right Ankle Dorsiflexion -7    Right Ankle Plantar Flexion 45    Right Ankle Inversion 20    Right Ankle Eversion 15      PROM   Right Ankle Dorsiflexion 0    Right Ankle Plantar Flexion 50    Right Ankle Inversion 25    Right Ankle Eversion 20                         [facility] Adult PT Treatment/Exercise - 11/23/19 0001      Lumbar Exercises: Aerobic   Elliptical R 5 6 min      Knee/Hip Exercises: Stretches   Gastroc Stretch Both;60 seconds    Soleus Stretch Both;60 seconds      Knee/Hip Exercises: Standing   Heel Raises Both;15 reps;2 sets    Other Standing Knee Exercises resisted gait x5 50# each direction      Manual Therapy   Manual Therapy  Passive ROM;Soft tissue mobilization;Joint mobilization    Joint Mobilization ankle mobs to increase DF    Soft tissue mobilization metatarsals    Passive ROM RT ankle and foot       Ankle Exercises: Standing   Toe Raise 15 reps    Other Standing Ankle Exercises SLS with cone taps on foam                    PT Short Term Goals - 10/11/19 1055      PT SHORT TERM GOAL #1   Title Pt will be independent with HEP    Status Achieved             PT Long Term Goals - 11/08/19 1028      PT LONG TERM GOAL #1   Title Pt will demonstrate ankle ROM WFL and painfree    Time 8    Period Weeks    Status On-going  PT LONG TERM GOAL #2   Title Pt will report ability to sit through client therapy sessions with no increase in LBP    Time 8    Period Weeks    Status On-going      PT LONG TERM GOAL #3   Title Pt will report ability to drive to/from work with no increase in RLE or LB pain    Time 8    Period Weeks    Status On-going      PT LONG TERM GOAL #4   Title Pt will report resolution of R knee pain with weightbearing activities    Time 8    Period Weeks    Status On-going                 Plan - 11/23/19 1025    Clinical Impression Statement Pt tolerated progression of TE well; some instability in SLS with cone taps improving with increasing practice. Pt demos improved ankle ROM except R ankle DF; still demos tightness with some pain at end range. Continue to progress ankle ROM and stability.    PT Treatment/Interventions ADLs/Self Care Home Management;Cryotherapy;Electrical Stimulation;Ultrasound;Moist Heat;Iontophoresis 4mg /ml Dexamethasone;Gait training;Stair training;Functional mobility training;Therapeutic activities;Therapeutic exercise;Neuromuscular re-education;Manual techniques;Patient/family education;Passive range of motion;Dry needling;Spinal Manipulations;Vasopneumatic Device;Taping    PT Next Visit Plan Hip strength, push ROM/function, address  goals    Consulted and Agree with Plan of Care Patient           Patient will benefit from skilled therapeutic intervention in order to improve the following deficits and impairments:     Visit Diagnosis: Acute bilateral low back pain without sciatica  Muscle weakness (generalized)  Pain in right ankle and joints of right foot  Acute pain of right knee  Cramp and spasm     Problem List Patient Active Problem List   Diagnosis Date Noted   Intramural leiomyoma of uterus 11/19/2015   DUB (dysfunctional uterine bleeding) 09/05/2015   Obesity (BMI 30-39.9) 03/19/2011   Amador Cunas, PT, DPT Donald Prose Jermari Tamargo 11/23/2019, 10:27 AM  [place], [place], [address] Phone: [phone]   Fax:  [phone]  Name: Kylar Speelman MRN: [id number] Date of Birth: [birth year]

## 2019-11-26 ENCOUNTER — Ambulatory Visit: Payer: Self-pay | Admitting: Physical Therapy

## 2019-11-26 ENCOUNTER — Encounter: Payer: Self-pay | Admitting: Physical Therapy

## 2019-11-26 ENCOUNTER — Other Ambulatory Visit: Payer: Self-pay

## 2019-11-26 DIAGNOSIS — M25571 Pain in right ankle and joints of right foot: Secondary | ICD-10-CM

## 2019-11-26 DIAGNOSIS — R252 Cramp and spasm: Secondary | ICD-10-CM

## 2019-11-26 DIAGNOSIS — M6281 Muscle weakness (generalized): Secondary | ICD-10-CM

## 2019-11-26 DIAGNOSIS — M25561 Pain in right knee: Secondary | ICD-10-CM

## 2019-11-26 DIAGNOSIS — M545 Low back pain, unspecified: Secondary | ICD-10-CM

## 2019-11-26 NOTE — Therapy (Signed)
[address] [place], [place], [address] Phone: [phone]   Fax:  [phone]  Physical Therapy Treatment  Patient Details  Name: Cassie Duncan MRN: [id number] Date of Birth: 1985-10-09 Referring Provider (PT): Delia Chimes   Encounter Date: 11/26/2019   PT End of Session - 11/26/19 1017    Visit Number 14    Date for PT Re-Evaluation 12/05/19    PT Start Time 0935    PT Stop Time 1015    PT Time Calculation (min) 40 min    Activity Tolerance Patient tolerated treatment well    Behavior During Therapy [facility] for tasks assessed/performed           History reviewed. No pertinent past medical history.  Past Surgical History:  Procedure Laterality Date   WISDOM TOOTH EXTRACTION  2005    There were no vitals filed for this visit.   Subjective Assessment - 11/26/19 0938    Subjective Pt reports feeling a little better overall; ankle feels better this week. States 5/10 pain in R ankle and knee this rx.    Currently in Pain? Yes    Pain Score 5     Pain Location Ankle    Pain Orientation Right                             [facility] Adult PT Treatment/Exercise - 11/26/19 0001      Lumbar Exercises: Aerobic   Elliptical R5 x 5 min    Nustep L5 x 6 min      Knee/Hip Exercises: Stretches   Gastroc Stretch Both;60 seconds    Soleus Stretch Both;60 seconds      Knee/Hip Exercises: Machines for Strengthening   Cybex Leg Press 40# 3x10 feet 3 ways    Total Gym Leg Press heel raises 40# 2x15      Knee/Hip Exercises: Standing   Other Standing Knee Exercises resisted gait x5 50# each direction      Ankle Exercises: Standing   Heel Raises 20 reps    Heel Walk (Round Trip) x2    Other Standing Ankle Exercises WBAT on dyna disc 4 way 15 times each                    PT Short Term Goals - 10/11/19 1055      PT SHORT TERM GOAL #1   Title Pt will be independent with HEP     Status Achieved             PT Long Term Goals - 11/26/19 0942      PT LONG TERM GOAL #1   Title Pt will demonstrate ankle ROM WFL and painfree    Baseline limited R ankle DF    Time 8    Period Weeks    Status Partially Met      PT LONG TERM GOAL #2   Title Pt will report ability to sit through client therapy sessions with no increase in LBP    Baseline able to sit through session with lumbar support and breaks for stretching    Time 8    Period Weeks    Status Partially Met      PT LONG TERM GOAL #3   Title Pt will report ability to drive to/from work with no increase in RLE or LB pain    Baseline okay on highway; hurts R ankle  with stop and go/city traffic    Time 8    Period Weeks    Status Partially Met      PT LONG TERM GOAL #4   Title Pt will report resolution of R knee pain with weightbearing activities    Baseline only notices when getting out of car or getting up from low surfaces    Time 8    Period Weeks    Status Partially Met                 Plan - 11/26/19 1040    Clinical Impression Statement Pt tolerated progression of TE well; added another week of visits. Pt would like to get new car in and see how driving in city regularly is going before d/c. Pt also requests lists of ex's next rx that can be completed at gym.    PT Treatment/Interventions ADLs/Self Care Home Management;Cryotherapy;Electrical Stimulation;Ultrasound;Moist Heat;Iontophoresis 60m/ml Dexamethasone;Gait training;Stair training;Functional mobility training;Therapeutic activities;Therapeutic exercise;Neuromuscular re-education;Manual techniques;Patient/family education;Passive range of motion;Dry needling;Spinal Manipulations;Vasopneumatic Device;Taping    PT Next Visit Plan Hip strength, push ROM/function, address goals    Consulted and Agree with Plan of Care Patient           Patient will benefit from skilled therapeutic intervention in order to improve the following deficits  and impairments:     Visit Diagnosis: Acute bilateral low back pain without sciatica  Muscle weakness (generalized)  Pain in right ankle and joints of right foot  Acute pain of right knee  Cramp and spasm     Problem List Patient Active Problem List   Diagnosis Date Noted   Intramural leiomyoma of uterus 11/19/2015   DUB (dysfunctional uterine bleeding) 09/05/2015   Obesity (BMI 30-39.9) 03/19/2011   AAmador Cunas PT, DPT ADonald ProseSugg 11/26/2019, 10:42 AM  [place] [address]Phone: [phone]  Fax:  [phone] Name: Cassie HammittMRN: [id number] of Birth: 901/10/1985

## 2019-11-29 ENCOUNTER — Other Ambulatory Visit: Payer: Self-pay

## 2019-11-29 ENCOUNTER — Ambulatory Visit: Payer: Self-pay | Admitting: Physical Therapy

## 2019-11-29 DIAGNOSIS — M6281 Muscle weakness (generalized): Secondary | ICD-10-CM

## 2019-11-29 DIAGNOSIS — M25561 Pain in right knee: Secondary | ICD-10-CM

## 2019-11-29 DIAGNOSIS — M25571 Pain in right ankle and joints of right foot: Secondary | ICD-10-CM

## 2019-11-29 DIAGNOSIS — M545 Low back pain, unspecified: Secondary | ICD-10-CM

## 2019-11-29 NOTE — Therapy (Signed)
[place] [address] [place], [place], [address] Phone: [phone]   Fax:  [phone]  Physical Therapy Treatment  Patient Details  Name: Cassie Duncan MRN: [id number] Date of Birth: 1985-10-07 Referring Provider (PT): Delia Chimes   Encounter Date: 11/29/2019   PT End of Session - 11/29/19 1007    Visit Number 15    Date for PT Re-Evaluation 12/05/19    PT Start Time 9450    PT Stop Time 1015    PT Time Calculation (min) 41 min           No past medical history on file.  Past Surgical History:  Procedure Laterality Date  . WISDOM TOOTH EXTRACTION  2005    There were no vitals filed for this visit.   Subjective Assessment - 11/29/19 0935    Subjective okay except top of RT foot    Currently in Pain? Yes    Pain Score 5     Pain Location Foot    Pain Orientation Right;Upper                             [facility] Adult PT Treatment/Exercise - 11/29/19 0001      Lumbar Exercises: Aerobic   Elliptical I 8 R 5 3 min fwd/3 min backward      Lumbar Exercises: Machines for Strengthening   Cybex Lumbar Extension black tband 2 sets 15   trunk flex.ext   Other Lumbar Machine Exercise lat pull and row 25# 2 sets 10      Knee/Hip Exercises: Machines for Strengthening   Cybex Knee Extension 20# 2x15 BLE    Cybex Knee Flexion 35# 2x15 BLE    Cybex Leg Press 50# 3 sets 10    Total Gym Leg Press heel raises 50# 2x15      Ankle Exercises: Standing   Other Standing Ankle Exercises SLS , step up/down                  PT Education - 11/29/19 0956    Education Details reviewed gym safety,progression and gave list of machines to use    Person(s) Educated Patient    Methods Explanation;Demonstration;Handout    Comprehension Verbalized understanding;Returned demonstration            PT Short Term Goals - 10/11/19 1055      PT SHORT TERM GOAL #1   Title Pt will be independent  with HEP    Status Achieved             PT Long Term Goals - 11/29/19 0957      PT LONG TERM GOAL #1   Title Pt will demonstrate ankle ROM WFL and painfree    Baseline 15 degrees    Status Achieved      PT LONG TERM GOAL #2   Title Pt will report ability to sit through client therapy sessions with no increase in LBP    Status Partially Met      PT LONG TERM GOAL #3   Title Pt will report ability to drive to/from work with no increase in RLE or LB pain    Baseline okay on highway; hurts R ankle with stop and go/city traffic    Status Partially Met                 Plan - 11/29/19 1007    Clinical Impression Statement  focus session on gym educ, safety and progression- handout issued    PT Next Visit Plan D/C next week           Patient will benefit from skilled therapeutic intervention in order to improve the following deficits and impairments:  Abnormal gait, Decreased range of motion, Difficulty walking, Increased muscle spasms, Obesity, Pain, Hypomobility  Visit Diagnosis: Pain in right ankle and joints of right foot  Acute pain of right knee  Acute bilateral low back pain without sciatica  Muscle weakness (generalized)     Problem List Patient Active Problem List   Diagnosis Date Noted  . Intramural leiomyoma of uterus 11/19/2015  . DUB (dysfunctional uterine bleeding) 09/05/2015  . Obesity (BMI 30-39.9) 03/19/2011    Juri Dinning,ANGIE PTA 11/29/2019, 10:08 AM  [address] [place], [place], [address] Phone: [phone]   Fax:  [phone]  Name: Danna Sewell MRN: [id number] Date of Birth: March 07, 1986

## 2019-12-03 ENCOUNTER — Ambulatory Visit: Payer: Self-pay | Attending: Internal Medicine | Admitting: Physical Therapy

## 2019-12-03 ENCOUNTER — Other Ambulatory Visit: Payer: Self-pay

## 2019-12-03 ENCOUNTER — Encounter: Payer: Self-pay | Admitting: Physical Therapy

## 2019-12-03 DIAGNOSIS — M6281 Muscle weakness (generalized): Secondary | ICD-10-CM | POA: Insufficient documentation

## 2019-12-03 DIAGNOSIS — M545 Low back pain, unspecified: Secondary | ICD-10-CM

## 2019-12-03 DIAGNOSIS — R252 Cramp and spasm: Secondary | ICD-10-CM | POA: Insufficient documentation

## 2019-12-03 DIAGNOSIS — M25571 Pain in right ankle and joints of right foot: Secondary | ICD-10-CM | POA: Insufficient documentation

## 2019-12-03 DIAGNOSIS — M25561 Pain in right knee: Secondary | ICD-10-CM | POA: Insufficient documentation

## 2019-12-03 NOTE — Therapy (Signed)
[place], [place], [address] Phone: [phone]   Fax:  [phone]  Physical Therapy Treatment  Patient Details  Name: Cassie Duncan MRN: [id number] Date of Birth: 03-Nov-1985 Referring Provider (PT): Delia Chimes   Encounter Date: 12/03/2019   PT End of Session - 12/03/19 1751    Visit Number 16    Date for PT Re-Evaluation 12/05/19    PT Start Time 3149    PT Stop Time 1704    PT Time Calculation (min) 50 min    Activity Tolerance Patient tolerated treatment well    Behavior During Therapy [facility] for tasks assessed/performed           History reviewed. No pertinent past medical history.  Past Surgical History:  Procedure Laterality Date   WISDOM TOOTH EXTRACTION  2005    There were no vitals filed for this visit.   Subjective Assessment - 12/03/19 1619    Subjective Still with some pain in the top of the foot, she will be going to gym after D/C here , she has one more appoitnment with [place] and we are working through questions about the gym and exercises to do    Currently in Pain? No/denies                             [facility] Adult PT Treatment/Exercise - 12/03/19 0001      High Level Balance   High Level Balance Comments SLS on airex 10 seconds each      Lumbar Exercises: Stretches   Other Lumbar Stretch Exercise gastroc and soleus stretches      Lumbar Exercises: Aerobic   Elliptical I=10 R=6 x 6 minutes      Lumbar Exercises: Machines for Strengthening   Other Lumbar Machine Exercise lat pull and row 25# 2 sets 10    Other Lumbar Machine Exercise hip extension and abduction on pulley 5# 2x10, triceps 25#, biceps 10#, straight arm pulls  and then in a tandem stance.      Lumbar Exercises: Standing   Other Standing Lumbar Exercises 15# farmers carry      Lumbar Exercises: Supine   Other Supine Lumbar Exercises feet on ball K2C, trunk rotaiton, small bridges  and isometric abs                    PT Short Term Goals - 10/11/19 1055      PT SHORT TERM GOAL #1   Title Pt will be independent with HEP    Status Achieved             PT Long Term Goals - 11/29/19 0957      PT LONG TERM GOAL #1   Title Pt will demonstrate ankle ROM WFL and painfree    Baseline 15 degrees    Status Achieved      PT LONG TERM GOAL #2   Title Pt will report ability to sit through client therapy sessions with no increase in LBP    Status Partially Met      PT LONG TERM GOAL #3   Title Pt will report ability to drive to/from work with no increase in RLE or LB pain    Baseline okay on highway; hurts R ankle with stop and go/city traffic    Status Partially Met  Plan - 12/03/19 1751    Clinical Impression Statement Patient is doing very well, only issue is some top of foot pain at times, I added some SLS on airex to work on intrinsic mms of the foot and balance, she did fatigue with the hip exercises, I gaver her more handouts on what we did so she can try to replicate at the gym, gave more instruction on form and core activation    PT Next Visit Plan go over HEP and gym and D/C    Consulted and Agree with Plan of Care Patient           Patient will benefit from skilled therapeutic intervention in order to improve the following deficits and impairments:  Abnormal gait, Decreased range of motion, Difficulty walking, Increased muscle spasms, Obesity, Pain, Hypomobility  Visit Diagnosis: Pain in right ankle and joints of right foot  Acute pain of right knee  Acute bilateral low back pain without sciatica  Muscle weakness (generalized)  Cramp and spasm     Problem List Patient Active Problem List   Diagnosis Date Noted   Intramural leiomyoma of uterus 11/19/2015   DUB (dysfunctional uterine bleeding) 09/05/2015   Obesity (BMI 30-39.9) 03/19/2011    Sumner Boast., PT 12/03/2019, 5:54 PM  [address] [place], [place], [address] Phone: [phone]   Fax:  [phone]  Name: Cassie Duncan MRN: [id number] Date of Birth: May 15, 1985

## 2019-12-05 ENCOUNTER — Other Ambulatory Visit: Payer: Self-pay

## 2019-12-05 ENCOUNTER — Ambulatory Visit: Payer: Self-pay | Admitting: Physical Therapy

## 2019-12-05 ENCOUNTER — Encounter: Payer: Self-pay | Admitting: Physical Therapy

## 2019-12-05 DIAGNOSIS — R252 Cramp and spasm: Secondary | ICD-10-CM

## 2019-12-05 DIAGNOSIS — M6281 Muscle weakness (generalized): Secondary | ICD-10-CM

## 2019-12-05 DIAGNOSIS — M25561 Pain in right knee: Secondary | ICD-10-CM

## 2019-12-05 DIAGNOSIS — M25571 Pain in right ankle and joints of right foot: Secondary | ICD-10-CM

## 2019-12-05 DIAGNOSIS — M545 Low back pain, unspecified: Secondary | ICD-10-CM

## 2019-12-05 NOTE — Therapy (Signed)
[place], [place], [address] Phone: [phone]   Fax:  [phone]  Physical Therapy Treatment PHYSICAL THERAPY DISCHARGE SUMMARY   Plan: Patient agrees to discharge.  Patient goals were partially met. Patient is being discharged due to being pleased with the current functional level.  ?????     Patient Details  Name: Cassie Duncan MRN: [id number] Date of Birth: 1986/02/22 Referring Provider (PT): Delia Chimes   Encounter Date: 12/05/2019   PT End of Session - 12/05/19 0926    Visit Number 17    Date for PT Re-Evaluation 12/05/19    PT Start Time 0845    PT Stop Time 0930    PT Time Calculation (min) 45 min    Activity Tolerance Patient tolerated treatment well    Behavior During Therapy [facility] for tasks assessed/performed           History reviewed. No pertinent past medical history.  Past Surgical History:  Procedure Laterality Date  . WISDOM TOOTH EXTRACTION  2005    There were no vitals filed for this visit.   Subjective Assessment - 12/05/19 0849    Subjective Doing ok overall, but is still having some foot pain    Currently in Pain? Yes    Pain Score 7     Pain Location Foot    Pain Orientation Right;Upper                             [facility] Adult PT Treatment/Exercise - 12/05/19 0001      Lumbar Exercises: Aerobic   Elliptical I=10 R=6 x 6 minutes      Lumbar Exercises: Machines for Strengthening   Other Lumbar Machine Exercise lat pull 2x10  and row 25# 2 sets 15     Other Lumbar Machine Exercise  triceps 25#, biceps 15#, straight arm pulls  25lb 2x15      Knee/Hip Exercises: Machines for Strengthening   Cybex Knee Extension 20# 2x10 BLE    Cybex Knee Flexion 35# 2x10 BLE    Cybex Leg Press 50# 2 sets 15, Heel raises 50lb 2x15      Knee/Hip Exercises: Standing   Walking with Sports Cord 50lb side step x5 each     Other Standing Knee Exercises  Thrusters yellow ball 2x10                     PT Short Term Goals - 10/11/19 1055      PT SHORT TERM GOAL #1   Title Pt will be independent with HEP    Status Achieved             PT Long Term Goals - 12/05/19 0926      PT LONG TERM GOAL #1   Title Pt will demonstrate ankle ROM WFL and painfree    Status Achieved      PT LONG TERM GOAL #2   Title Pt will report ability to sit through client therapy sessions with no increase in LBP    Status Achieved      PT LONG TERM GOAL #3   Title Pt will report ability to drive to/from work with no increase in RLE or LB pain    Status Partially Met      PT LONG TERM GOAL #4   Title Pt will report resolution of R knee pain with weightbearing activities  Status Achieved                 Plan - 12/05/19 0927    Clinical Impression Statement Pt continues to do very well overall but is still having some foot issues. She has been going to the gym on her own without issues.  Most goals met. Pt will continues her care on her own    Personal Factors and Comorbidities Fitness    Examination-Activity Limitations Bend;Carry;Lift;Stand;Stairs;Squat;Locomotion Level    Examination-Participation Restrictions Community Activity;Driving;Interpersonal Relationship    Stability/Clinical Decision Making Stable/Uncomplicated    Rehab Potential Good    PT Frequency 2x / week    PT Duration 8 weeks    PT Treatment/Interventions ADLs/Self Care Home Management;Cryotherapy;Electrical Stimulation;Ultrasound;Moist Heat;Iontophoresis 34m/ml Dexamethasone;Gait training;Stair training;Functional mobility training;Therapeutic activities;Therapeutic exercise;Neuromuscular re-education;Manual techniques;Patient/family education;Passive range of motion;Dry needling;Spinal Manipulations;Vasopneumatic Device;Taping    PT Next Visit Plan D/C           Patient will benefit from skilled therapeutic intervention in order to improve the following  deficits and impairments:  Abnormal gait, Decreased range of motion, Difficulty walking, Increased muscle spasms, Obesity, Pain, Hypomobility  Visit Diagnosis: Acute bilateral low back pain without sciatica  Acute pain of right knee  Pain in right ankle and joints of right foot  Cramp and spasm  Muscle weakness (generalized)     Problem List Patient Active Problem List   Diagnosis Date Noted  . Intramural leiomyoma of uterus 11/19/2015  . DUB (dysfunctional uterine bleeding) 09/05/2015  . Obesity (BMI 30-39.9) 03/19/2011   AAmador Cunas PT, DPT RScot Jun PTA 12/05/2019, 9:30 AM  [address] [facility] [place] [address]Phone: [phone]  Fax:  [phone] Name: Cassie GentMRN: [id number] of Birth: 9April 19, 1987

## 2020-02-22 DIAGNOSIS — R030 Elevated blood-pressure reading, without diagnosis of hypertension: Secondary | ICD-10-CM | POA: Insufficient documentation

## 2020-02-22 DIAGNOSIS — R0683 Snoring: Secondary | ICD-10-CM | POA: Insufficient documentation

## 2020-02-22 DIAGNOSIS — R002 Palpitations: Secondary | ICD-10-CM | POA: Insufficient documentation

## 2020-02-22 DIAGNOSIS — E66813 Obesity, class 3: Secondary | ICD-10-CM | POA: Insufficient documentation

## 2020-02-22 HISTORY — DX: Elevated blood-pressure reading, without diagnosis of hypertension: R03.0

## 2020-02-22 HISTORY — DX: Obesity, class 3: E66.813

## 2020-02-26 ENCOUNTER — Telehealth: Payer: Self-pay

## 2020-02-26 NOTE — Telephone Encounter (Signed)
This patient called earlier today to speak with someone about a sleep study. They left a voicemail last week with no return call. The best number to reach her at is [phone].

## 2020-02-28 ENCOUNTER — Institutional Professional Consult (permissible substitution): Payer: BLUE CROSS/BLUE SHIELD | Admitting: Neurology

## 2020-05-26 DIAGNOSIS — R5383 Other fatigue: Secondary | ICD-10-CM | POA: Insufficient documentation

## 2020-07-16 DIAGNOSIS — L818 Other specified disorders of pigmentation: Secondary | ICD-10-CM | POA: Diagnosis not present

## 2020-07-16 DIAGNOSIS — L700 Acne vulgaris: Secondary | ICD-10-CM | POA: Diagnosis not present

## 2020-08-25 DIAGNOSIS — R030 Elevated blood-pressure reading, without diagnosis of hypertension: Secondary | ICD-10-CM | POA: Diagnosis not present

## 2020-08-27 DIAGNOSIS — L818 Other specified disorders of pigmentation: Secondary | ICD-10-CM | POA: Diagnosis not present

## 2020-08-27 DIAGNOSIS — L570 Actinic keratosis: Secondary | ICD-10-CM | POA: Diagnosis not present

## 2020-08-27 DIAGNOSIS — L700 Acne vulgaris: Secondary | ICD-10-CM | POA: Diagnosis not present

## 2020-09-05 DIAGNOSIS — M79671 Pain in right foot: Secondary | ICD-10-CM | POA: Diagnosis not present

## 2020-09-05 DIAGNOSIS — M7671 Peroneal tendinitis, right leg: Secondary | ICD-10-CM | POA: Diagnosis not present

## 2020-09-05 DIAGNOSIS — M7672 Peroneal tendinitis, left leg: Secondary | ICD-10-CM | POA: Diagnosis not present

## 2020-09-05 DIAGNOSIS — M79672 Pain in left foot: Secondary | ICD-10-CM | POA: Diagnosis not present

## 2020-10-06 DIAGNOSIS — N921 Excessive and frequent menstruation with irregular cycle: Secondary | ICD-10-CM | POA: Diagnosis not present

## 2020-10-06 DIAGNOSIS — D259 Leiomyoma of uterus, unspecified: Secondary | ICD-10-CM | POA: Diagnosis not present

## 2020-10-06 DIAGNOSIS — Z01419 Encounter for gynecological examination (general) (routine) without abnormal findings: Secondary | ICD-10-CM | POA: Diagnosis not present

## 2020-10-06 DIAGNOSIS — Z975 Presence of (intrauterine) contraceptive device: Secondary | ICD-10-CM | POA: Diagnosis not present

## 2020-10-09 DIAGNOSIS — L700 Acne vulgaris: Secondary | ICD-10-CM | POA: Diagnosis not present

## 2020-10-09 DIAGNOSIS — L851 Acquired keratosis [keratoderma] palmaris et plantaris: Secondary | ICD-10-CM | POA: Diagnosis not present

## 2020-10-09 DIAGNOSIS — Z792 Long term (current) use of antibiotics: Secondary | ICD-10-CM | POA: Diagnosis not present

## 2020-10-09 DIAGNOSIS — L818 Other specified disorders of pigmentation: Secondary | ICD-10-CM | POA: Diagnosis not present

## 2020-11-17 LAB — HM PAP SMEAR

## 2021-05-14 DIAGNOSIS — G4733 Obstructive sleep apnea (adult) (pediatric): Secondary | ICD-10-CM | POA: Diagnosis not present

## 2021-05-14 DIAGNOSIS — G478 Other sleep disorders: Secondary | ICD-10-CM | POA: Diagnosis not present

## 2021-05-15 DIAGNOSIS — G4733 Obstructive sleep apnea (adult) (pediatric): Secondary | ICD-10-CM | POA: Insufficient documentation

## 2021-06-18 DIAGNOSIS — G4733 Obstructive sleep apnea (adult) (pediatric): Secondary | ICD-10-CM | POA: Diagnosis not present

## 2021-07-16 DIAGNOSIS — G4733 Obstructive sleep apnea (adult) (pediatric): Secondary | ICD-10-CM | POA: Diagnosis not present

## 2021-08-16 DIAGNOSIS — G4733 Obstructive sleep apnea (adult) (pediatric): Secondary | ICD-10-CM | POA: Diagnosis not present

## 2021-08-27 ENCOUNTER — Emergency Department (HOSPITAL_BASED_OUTPATIENT_CLINIC_OR_DEPARTMENT_OTHER): Payer: 59

## 2021-08-27 ENCOUNTER — Emergency Department (HOSPITAL_BASED_OUTPATIENT_CLINIC_OR_DEPARTMENT_OTHER)
Admission: EM | Admit: 2021-08-27 | Discharge: 2021-08-27 | Disposition: A | Discharge status: Home / Self Care | Payer: 59 | Attending: Emergency Medicine | Admitting: Emergency Medicine

## 2021-08-27 ENCOUNTER — Encounter (HOSPITAL_BASED_OUTPATIENT_CLINIC_OR_DEPARTMENT_OTHER): Payer: Self-pay

## 2021-08-27 ENCOUNTER — Other Ambulatory Visit: Payer: Self-pay

## 2021-08-27 DIAGNOSIS — F419 Anxiety disorder, unspecified: Secondary | ICD-10-CM | POA: Insufficient documentation

## 2021-08-27 DIAGNOSIS — Z79899 Other long term (current) drug therapy: Secondary | ICD-10-CM | POA: Insufficient documentation

## 2021-08-27 DIAGNOSIS — R079 Chest pain, unspecified: Secondary | ICD-10-CM | POA: Diagnosis not present

## 2021-08-27 DIAGNOSIS — I160 Hypertensive urgency: Secondary | ICD-10-CM | POA: Insufficient documentation

## 2021-08-27 DIAGNOSIS — R42 Dizziness and giddiness: Secondary | ICD-10-CM | POA: Diagnosis not present

## 2021-08-27 DIAGNOSIS — R69 Illness, unspecified: Secondary | ICD-10-CM | POA: Diagnosis not present

## 2021-08-27 DIAGNOSIS — R519 Headache, unspecified: Secondary | ICD-10-CM | POA: Diagnosis not present

## 2021-08-27 DIAGNOSIS — R531 Weakness: Secondary | ICD-10-CM | POA: Diagnosis not present

## 2021-08-27 DIAGNOSIS — I10 Essential (primary) hypertension: Secondary | ICD-10-CM | POA: Insufficient documentation

## 2021-08-27 DIAGNOSIS — I159 Secondary hypertension, unspecified: Secondary | ICD-10-CM | POA: Diagnosis not present

## 2021-08-27 HISTORY — DX: Sleep apnea, unspecified: G47.30

## 2021-08-27 LAB — URINALYSIS, ROUTINE W REFLEX MICROSCOPIC
Bilirubin Urine: NEGATIVE
Glucose, UA: NEGATIVE mg/dL
Hgb urine dipstick: NEGATIVE
Ketones, ur: NEGATIVE mg/dL
Leukocytes,Ua: NEGATIVE
Nitrite: NEGATIVE
Protein, ur: NEGATIVE mg/dL
Specific Gravity, Urine: 1.015 (ref 1.005–1.030)
pH: 6 (ref 5.0–8.0)

## 2021-08-27 LAB — PREGNANCY, URINE: Preg Test, Ur: NEGATIVE

## 2021-08-27 LAB — CBG MONITORING, ED: Glucose-Capillary: 94 mg/dL (ref 70–99)

## 2021-08-27 LAB — BASIC METABOLIC PANEL
Anion gap: 6 (ref 5–15)
BUN: 10 mg/dL (ref 6–20)
CO2: 22 mmol/L (ref 22–32)
Calcium: 8.9 mg/dL (ref 8.9–10.3)
Chloride: 107 mmol/L (ref 98–111)
Creatinine, Ser: 0.76 mg/dL (ref 0.44–1.00)
GFR, Estimated: >60 mL/min (ref >60)
Glucose, Bld: 100 mg/dL — ABNORMAL HIGH (ref 70–99)
Potassium: 4.2 mmol/L (ref 3.5–5.1)
Sodium: 135 mmol/L (ref 135–145)

## 2021-08-27 LAB — CBC
HCT: 35.7 % — ABNORMAL LOW (ref 36.0–46.0)
Hemoglobin: 12.2 g/dL (ref 12.0–15.0)
MCH: 29.5 pg (ref 26.0–34.0)
MCHC: 34.2 g/dL (ref 30.0–36.0)
MCV: 86.4 fL (ref 80.0–100.0)
Platelets: 346 10*3/uL (ref 150–400)
RBC: 4.13 MIL/uL (ref 3.87–5.11)
RDW: 12.2 % (ref 11.5–15.5)
WBC: 7.9 10*3/uL (ref 4.0–10.5)
nRBC: 0 % (ref 0.0–0.2)

## 2021-08-27 LAB — TROPONIN I (HIGH SENSITIVITY): Troponin I (High Sensitivity): <2 ng/L (ref <18)

## 2021-08-27 MED ORDER — HYDROXYZINE HCL 25 MG PO TABS: 25.0000 mg | ORAL_TABLET | Freq: Three times a day (TID) | ORAL | 0 refills | Status: DC | PRN

## 2021-08-27 MED ORDER — SODIUM CHLORIDE 0.9 % IV BOLUS
1000.0000 mL | Freq: Once | INTRAVENOUS | Status: AC
  Administered 2021-08-27: 1000 mL via INTRAVENOUS

## 2021-08-27 NOTE — ED Triage Notes (Signed)
Pt presents with elevated B/P and lightheadedness and sent here by UC. Reports 160s/100s. Pt states she has been intermittent fasting this week.  ?

## 2021-08-27 NOTE — ED Provider Notes (Signed)
?[facility] ?Provider Note ? ? ?CSN: [id number] ?Arrival date & time: 08/27/21  1401 ? ?  ? ?History ? ?Chief Complaint  ?Patient presents with  ? Hypertension  ? ? ?Cassie Duncan is a 36 y.o. female. ? ?36 year old female presents today for evaluation of lightheadedness, elevated blood pressure, overall just not feeling like herself this week.  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  States she has had significant amount of stress over the past week. States it has been going on most of this week.  She denies fever, chills, chest pain, shortness of breath.  She does endorse starting intermittent fasting this week as of Monday which is new for her.  Denies difficulty communicating, or weakness or other strokelike symptoms.  She denies prior history of hypertension.  However she has been told she had elevated blood pressure in the past and even believes that she was prescribed a medication that she has not picked up previously.  She was seen at urgent care today and they did prescribe an antihypertensive. ? ?The history is provided by the patient. No language interpreter was used.  ? ?  ? ?Home Medications ?Prior to Admission medications   ?Medication Sig Start Date End Date Taking? Authorizing Provider  ?clindamycin (CLEOCIN) 2 % vaginal cream APPLY INTRAVAGINALLY X 5 NIGHTS AT BEDTIME. ?Patient not taking: Reported on 09/05/2015 03/08/12   Terrance Mass, MD  ?Cyanocobalamin (B-12) 1000 MCG CAPS Take by mouth.    [provider]  ?folic acid (FOLVITE) 656 MCG tablet Take 400 mcg by mouth 2 (two) times daily. Reported on 09/05/2015    [provider]  ?Hydrocodone-Acetaminophen 5-300 MG TABS Take one tablet po q 4-6 hours prn pain. ?Patient not taking: Reported on 08/18/2017 01/07/16   Terrance Mass, MD  ?Mefenamic Acid 250 MG CAPS Take one cap po q6h prn menstrual cramps. ?Patient not taking: Reported on 08/18/2017 01/07/16   Terrance Mass, MD   ?MICROGESTIN FE 1/20 1-20 MG-MCG tablet TAKE 1 TABLET BY MOUTH DAILY AS DIRECTED**TAKE ONLY 21 TABS, THEN EVERY THIRD PACK TAKE ALL 28 TABS** ?Patient not taking: Reported on 09/05/2015    Terrance Mass, MD  ?Multiple Vitamin (MULTIVITAMIN) capsule Take 1 capsule by mouth daily.      [provider]  ?OVER THE COUNTER MEDICATION CTS 360 DIETARY SUPPLEMENT .Marland Kitchen429 MG.. TAKES TWICE A DAY. [place] MG TWICE ADAY     [provider]  ?phentermine 37.5 MG capsule Take 1 capsule (37.5 mg total) by mouth every morning. 02/28/12 03/29/12  Terrance Mass, MD  ?Safflower Oil (CLA) 1000 MG CAPS Take 1,000 mg by mouth daily.      [provider]  ?traMADol (ULTRAM) 50 MG tablet Take 1 tablet (50 mg total) by mouth every 6 (six) hours as needed. ?Patient not taking: Reported on 08/18/2017 08/20/16   Fontaine, Belinda Block, MD  ?   ? ?Allergies    ?Patient has no known allergies.   ? ?Review of Systems   ?Review of Systems  ?Constitutional:  Negative for chills and fever.  ?Eyes:  Negative for photophobia and visual disturbance.  ?Respiratory:  Negative for shortness of breath.   ?Cardiovascular:  Negative for chest pain.  ?Gastrointestinal:  Negative for abdominal pain, nausea and vomiting.  ?Genitourinary:  Negative for dysuria.  ?Neurological:  Positive for light-headedness and headaches. Negative for syncope, speech difficulty and weakness.  ?All other systems reviewed  and are negative. ? ?Physical Exam ?Updated Vital Signs ?BP (!) 163/98   Pulse 81   Temp 98.3 ?F (36.8 ?C) (Oral)   Resp 17   Ht '5\' 3"'$  (1.6 m)   Wt 133.8 kg   LMP 08/03/2021   SpO2 100%   BMI 52.26 kg/m?  ?Physical Exam ?Vitals and nursing note reviewed.  ?Constitutional:   ?   General: She is not in acute distress. ?   Appearance: Normal appearance. She is not ill-appearing.  ?HENT:  ?   Head: Normocephalic and atraumatic.  ?   Nose: Nose normal.  ?Eyes:  ?   General: No scleral icterus. ?    Extraocular Movements: Extraocular movements intact.  ?   Conjunctiva/sclera: Conjunctivae normal.  ?Cardiovascular:  ?   Rate and Rhythm: Normal rate and regular rhythm.  ?   Pulses: Normal pulses.  ?Pulmonary:  ?   Effort: Pulmonary effort is normal. No respiratory distress.  ?   Breath sounds: Normal breath sounds. No wheezing or rales.  ?Abdominal:  ?   General: There is no distension.  ?   Tenderness: There is no abdominal tenderness.  ?Musculoskeletal:     ?   General: Normal range of motion.  ?   Cervical back: Normal range of motion.  ?Skin: ?   General: Skin is warm and dry.  ?Neurological:  ?   General: No focal deficit present.  ?   Mental Status: She is alert. Mental status is at baseline.  ?   Comments: Cranial nerves III through XII intact.  Without facial droop or dysarthria.  Sensation intact in upper and lower extremities.  5/5 strength in bilateral lower extremities and upper extremities in both extensor and flexor muscle groups.  Without pronator drift.  ? ? ?ED Results / Procedures / Treatments   ?Labs ?(all labs ordered are listed, but only abnormal results are displayed) ?Labs Reviewed  ?CBC - Abnormal; Notable for the following components:  ?    Result Value  ? HCT 35.7 (*)   ? All other components within normal limits  ?URINALYSIS, ROUTINE W REFLEX MICROSCOPIC  ?PREGNANCY, URINE  ?BASIC METABOLIC PANEL  ?CBG MONITORING, ED  ?TROPONIN I (HIGH SENSITIVITY)  ? ? ?EKG ?EKG Interpretation ? ?Date/Time:  Thursday August 27 2021 14:18:55 EDT ?Ventricular Rate:  97 ?PR Interval:  132 ?QRS Duration: 76 ?QT Interval:  378 ?QTC Calculation: 480 ?R Axis:   9 ?Text Interpretation: Normal sinus rhythm Minimal voltage criteria for LVH, may be normal variant ( R in aVL ) Prolonged QT Abnormal ECG No previous ECGs available Confirmed by Campbell Stall ([id number]) on 8/76/8115 3:16:09 PM ? ?Radiology ?No results found. ? ?Procedures ?Procedures  ? ? ?Medications Ordered in ED ?Medications  ?sodium chloride 0.9 % bolus  1,000 mL (has no administration in time range)  ? ? ?ED Course/ Medical Decision Making/ A&P ?  ?                        ?Medical Decision Making ?Amount and/or Complexity of Data Reviewed ?Labs: ordered. ?Radiology: ordered. ? ? ?Medical Decision Making / ED Course ? ? ?This patient presents to the ED for concern of lightheadedness, elevated blood pressure, headache, this involves an extensive number of treatment options, and is a complaint that carries with it a high risk of complications and morbidity.  The differential diagnosis includes CVA, ICH, hypertensive urgency, hypertensive emergency ? ?MDM: ?36 year old female presents today for evaluation of  elevated blood pressure, lightheadedness from urgent care for further evaluation.  Patient reports that she has had significant amount of stress lately.  She does state that she typically struggles with anxiety.  Currently she denies chest pain, shortness of breath, dizziness.  Exam is reassuring and without concern for acute stroke.  Her blood pressure is elevated.  She was prescribed an antihypertensive this morning.  She has not taken a dose yet.  CBC without leukocytosis or anemia.  BMP without renal insufficiency or electrolyte abnormalities.  UA without acute UTI.  Troponin less than 2.  EKG without acute ischemic changes.  Chest x-ray without acute cardiopulmonary process.  CT head without acute intracranial process.  Believe patient's presentation is multifactorial.  It could be related to the increased anxiety and stress at work however given that she just started intermittent fasting daily since the beginning of the week I also feel that is contributing to her lightheadedness and headache.  Patient after fluids does note improvement in symptoms.  Patient has been ambulating in the unit without much difficulty.  Her work-up is reassuring.  I encouraged her to take her antihypertensive as prescribed and keep a blood pressure diary.  She has an upcoming  appointment on 5/2 with her PCP.  She is established with [facility] primary care provider.  Patient is appropriate for discharge.  Discharged in stable condition.  Return precautions discussed.  Patient voices understanding and is

## 2021-08-27 NOTE — Discharge Instructions (Addendum)
Your blood pressure today was elevated.  However I believe your presentation was due to multiple factors including increased levels of stress and anxiety, starting intermittent fasting.  I do recommend that she stop intermittent fasting for now and when you are ready to do so start with a few days out of the week and build yourself up.  I have also sent in hydroxyzine into the pharmacy for you.  You can use this as needed for anxiety.  Recommend you follow-up with your PCP and in the meantime keep a blood pressure diary.  Take the antihypertensive that was prescribed to you by the urgent care as directed.  If you have any worsening or concerning symptoms please return to the emergency room such as weakness, chest pain, shortness of breath. ?

## 2021-08-31 ENCOUNTER — Emergency Department (HOSPITAL_BASED_OUTPATIENT_CLINIC_OR_DEPARTMENT_OTHER): Payer: 59

## 2021-08-31 ENCOUNTER — Other Ambulatory Visit: Payer: Self-pay

## 2021-08-31 ENCOUNTER — Emergency Department (HOSPITAL_BASED_OUTPATIENT_CLINIC_OR_DEPARTMENT_OTHER)
Admission: EM | Admit: 2021-08-31 | Discharge: 2021-08-31 | Disposition: A | Discharge status: Home / Self Care | Payer: 59 | Attending: Emergency Medicine | Admitting: Emergency Medicine

## 2021-08-31 ENCOUNTER — Encounter (HOSPITAL_BASED_OUTPATIENT_CLINIC_OR_DEPARTMENT_OTHER): Payer: Self-pay

## 2021-08-31 DIAGNOSIS — E878 Other disorders of electrolyte and fluid balance, not elsewhere classified: Secondary | ICD-10-CM | POA: Diagnosis not present

## 2021-08-31 DIAGNOSIS — R000 Tachycardia, unspecified: Secondary | ICD-10-CM | POA: Diagnosis not present

## 2021-08-31 DIAGNOSIS — Z79899 Other long term (current) drug therapy: Secondary | ICD-10-CM | POA: Insufficient documentation

## 2021-08-31 DIAGNOSIS — M79652 Pain in left thigh: Secondary | ICD-10-CM | POA: Insufficient documentation

## 2021-08-31 DIAGNOSIS — I10 Essential (primary) hypertension: Secondary | ICD-10-CM | POA: Insufficient documentation

## 2021-08-31 DIAGNOSIS — M25472 Effusion, left ankle: Secondary | ICD-10-CM | POA: Insufficient documentation

## 2021-08-31 DIAGNOSIS — E876 Hypokalemia: Secondary | ICD-10-CM | POA: Diagnosis not present

## 2021-08-31 DIAGNOSIS — M7989 Other specified soft tissue disorders: Secondary | ICD-10-CM | POA: Diagnosis not present

## 2021-08-31 DIAGNOSIS — E871 Hypo-osmolality and hyponatremia: Secondary | ICD-10-CM | POA: Diagnosis not present

## 2021-08-31 HISTORY — DX: Essential (primary) hypertension: I10

## 2021-08-31 LAB — TROPONIN I (HIGH SENSITIVITY): Troponin I (High Sensitivity): 6 ng/L (ref <18)

## 2021-08-31 LAB — PREGNANCY, URINE: Preg Test, Ur: NEGATIVE

## 2021-08-31 LAB — CBC
HCT: 40.6 % (ref 36.0–46.0)
Hemoglobin: 14.1 g/dL (ref 12.0–15.0)
MCH: 29.4 pg (ref 26.0–34.0)
MCHC: 34.7 g/dL (ref 30.0–36.0)
MCV: 84.8 fL (ref 80.0–100.0)
Platelets: 407 10*3/uL — ABNORMAL HIGH (ref 150–400)
RBC: 4.79 MIL/uL (ref 3.87–5.11)
RDW: 11.9 % (ref 11.5–15.5)
WBC: 8.7 10*3/uL (ref 4.0–10.5)
nRBC: 0 % (ref 0.0–0.2)

## 2021-08-31 LAB — BASIC METABOLIC PANEL
Anion gap: 10 (ref 5–15)
BUN: 8 mg/dL (ref 6–20)
CO2: 28 mmol/L (ref 22–32)
Calcium: 9.6 mg/dL (ref 8.9–10.3)
Chloride: 92 mmol/L — ABNORMAL LOW (ref 98–111)
Creatinine, Ser: 0.86 mg/dL (ref 0.44–1.00)
GFR, Estimated: >60 mL/min (ref >60)
Glucose, Bld: 107 mg/dL — ABNORMAL HIGH (ref 70–99)
Potassium: 3.4 mmol/L — ABNORMAL LOW (ref 3.5–5.1)
Sodium: 130 mmol/L — ABNORMAL LOW (ref 135–145)

## 2021-08-31 LAB — D-DIMER, QUANTITATIVE: D-Dimer, Quant: 0.36 ug/mL-FEU (ref 0.00–0.50)

## 2021-08-31 MED ORDER — SODIUM CHLORIDE 0.9 % IV BOLUS
1000.0000 mL | Freq: Once | INTRAVENOUS | Status: AC
  Administered 2021-08-31: 1000 mL via INTRAVENOUS

## 2021-08-31 MED ORDER — IOHEXOL 350 MG/ML SOLN
100.0000 mL | Freq: Once | INTRAVENOUS | Status: AC | PRN
  Administered 2021-08-31: 100 mL via INTRAVENOUS

## 2021-08-31 MED ORDER — POTASSIUM CHLORIDE CRYS ER 20 MEQ PO TBCR
40.0000 meq | EXTENDED_RELEASE_TABLET | Freq: Once | ORAL | Status: AC
  Administered 2021-08-31: 40 meq via ORAL
  Filled 2021-08-31: qty 2

## 2021-08-31 NOTE — ED Notes (Addendum)
Attempted [place] IV in Rt AC and Lt AC due to difficulty in obtaining labs. Attempted x 2, sm blood return noted, unable to advance catheter at either site, ED MD at bedside and observed attempts. ED MD will attempt [place] IV in Left Arm. CMS post [place] IV attempts reassessed, noted that CMS is WNL in both RUE and LUE ?

## 2021-08-31 NOTE — Discharge Instructions (Addendum)
You were seen in the emergency department today for fast heart rate. ? ?As we discussed your lab work looked reassuring today.  We also imaged your legs as well as your chest to look for any signs of blood clots, and there is no blood clot seen.  Your potassium, sodium and chloride were slightly low so we have replaced these for you. ? ?I'd like you to discuss with your primary doctor the potential for a cardiac monitor, as well as thyroid testing. ? ?Continue to monitor how you're doing and return to the ER for new or worsening symptoms.  ?

## 2021-08-31 NOTE — ED Triage Notes (Signed)
Pt reports her HR has been elevated today. Checking it at home with pulse ox. Reports rate 105-115 today. Denies chest pain or SOB. Took BP Med at 7 am ?

## 2021-08-31 NOTE — ED Notes (Addendum)
ED Provider at bedside.  Attempt at IV access unsuccessful, to be attempted by u/s  ?

## 2021-08-31 NOTE — ED Provider Notes (Signed)
?  Physical Exam  ?BP (!) 146/59   Pulse 93   Temp 97.9 ?F (36.6 ?C) (Oral)   Resp 19   Ht '5\' 3"'$  (1.6 m)   Wt 133 kg   LMP 08/03/2021 Comment: Nexalon removed 4 weeks ago  SpO2 100%   BMI 51.94 kg/m?  ? ?Physical Exam ?Vitals and nursing note reviewed.  ?Constitutional:   ?   Appearance: Normal appearance.  ?HENT:  ?   Head: Normocephalic and atraumatic.  ?Eyes:  ?   Conjunctiva/sclera: Conjunctivae normal.  ?Cardiovascular:  ?   Rate and Rhythm: Normal rate and regular rhythm.  ?Pulmonary:  ?   Effort: Pulmonary effort is normal. No respiratory distress.  ?   Breath sounds: Normal breath sounds.  ?Abdominal:  ?   General: There is no distension.  ?   Palpations: Abdomen is soft.  ?   Tenderness: There is no abdominal tenderness.  ?Skin: ?   General: Skin is warm and dry.  ?Neurological:  ?   General: No focal deficit present.  ?   Mental Status: She is alert.  ? ? ?Procedures  ?Procedures ? ?ED Course / MDM  ? ?  ? ?Medical Decision Making ?Amount and/or Complexity of Data Reviewed ?Labs: ordered. ?Radiology: ordered. ? ?Risk ?Prescription drug management. ? ? ?Accepted handoff at shift change from [facility]. Please see prior provider note for full HPI. ? ?Briefly: Patient is 36 year old female who presents to the ER for tachycardia starting today. Was recently started on HCTZ for hypertension. Also complaining of cramping to left lower thigh and left ankle swelling.  ? ?DDX/Plan: Plan at time of shift change was to follow up patient's ultrasound and CT imaging to rule out DVT/PE.  ? ?On reevaluation, patient appears clinically well. Mild hyponatremia of 130, hypochloremia of 92, and hypokalemia of 3.4 noted. Electrolytes replenished with IVF and potassium tablets.  ? ?EKG interpreted by myself and attending physician Dr. Wyvonnia Dusky which shows sinus tachycardia with LVH. I agree with this interpretation. ? ?Imaging personally interpreted by myself which shows no evidence of DVT or PE.   ? ?Will d/c to home  with recommendations to follow up with PCP and cardiologist about possible cardiac monitoring and thyroid testing.  ?  ?Kateri Plummer, PA-C ?08/31/21 2144 ? ?  ?Ezequiel Essex, MD ?09/01/21 0119 ? ?

## 2021-08-31 NOTE — ED Notes (Signed)
Ambulated to bathroom with steady gait, no distress.  Denies dizziness, denies feeling racing heart beat.  Dr Wyvonnia Dusky to attempt u/s guided PIV and blood draw.  Multiple attempts by RN unsuccessful with u/s ?

## 2021-08-31 NOTE — ED Provider Notes (Signed)
?[facility] EMERGENCY DEPARTMENT ?Provider Note ? ? ?CSN: [id number] ?Arrival date & time: 08/31/21  1606 ? ?  ? ?History ? ?Chief Complaint  ?Patient presents with  ? Tachycardia  ? ? ?Cassie Duncan is a 36 y.o. female with a PMHx of HTN who presents to the ED complaining of tachycardia onset today.  She notes that she has been taking hydrochlorothiazide since 08/27/2021.  Patient notes that she has been walking approximately 15 minutes each day since she was seen at the urgent care on 08/27/2021.  She has also changed around her diet and is consuming more fruits and veggies and plant protein powder.  Patient notes that she drives approximately 2 hours to [place], [place] one-way for work. Patient notes that she has noted cramping to the left lower posterior thigh and left ankle swelling that she noted. Patient notes that she had the Nexplanon removed approximately 4-5 weeks ago.  Denies past medical history of DVT/PE, recent immobilization or surgery.  Denies chest pain, shortness of breath, fever, chills, cough, abdominal pain, nausea, vomiting. ? ? ? ?The history is provided by the patient. No language interpreter was used.  ? ?  ? ?Home Medications ?Prior to Admission medications   ?Medication Sig Start Date End Date Taking? Authorizing Provider  ?clindamycin (CLEOCIN) 2 % vaginal cream APPLY INTRAVAGINALLY X 5 NIGHTS AT BEDTIME. ?Patient not taking: Reported on 09/05/2015 03/08/12   Terrance Mass, MD  ?Cyanocobalamin (B-12) 1000 MCG CAPS Take by mouth.    [provider]  ?folic acid (FOLVITE) 062 MCG tablet Take 400 mcg by mouth 2 (two) times daily. Reported on 09/05/2015    [provider]  ?Hydrocodone-Acetaminophen 5-300 MG TABS Take one tablet po q 4-6 hours prn pain. ?Patient not taking: Reported on 08/18/2017 01/07/16   Terrance Mass, MD  ?hydrOXYzine (ATARAX) 25 MG tablet Take 1 tablet (25 mg total) by mouth every 8 (eight) hours as needed. 08/27/21   Evlyn Courier, PA-C   ?Mefenamic Acid 250 MG CAPS Take one cap po q6h prn menstrual cramps. ?Patient not taking: Reported on 08/18/2017 01/07/16   Terrance Mass, MD  ?MICROGESTIN FE 1/20 1-20 MG-MCG tablet TAKE 1 TABLET BY MOUTH DAILY AS DIRECTED**TAKE ONLY 21 TABS, THEN EVERY THIRD PACK TAKE ALL 28 TABS** ?Patient not taking: Reported on 09/05/2015    Terrance Mass, MD  ?Multiple Vitamin (MULTIVITAMIN) capsule Take 1 capsule by mouth daily.      [provider]  ?OVER THE COUNTER MEDICATION CTS 360 DIETARY SUPPLEMENT .Marland Kitchen429 MG.. TAKES TWICE A DAY. [place] MG TWICE ADAY     [provider]  ?phentermine 37.5 MG capsule Take 1 capsule (37.5 mg total) by mouth every morning. 02/28/12 03/29/12  Terrance Mass, MD  ?Safflower Oil (CLA) 1000 MG CAPS Take 1,000 mg by mouth daily.      [provider]  ?traMADol (ULTRAM) 50 MG tablet Take 1 tablet (50 mg total) by mouth every 6 (six) hours as needed. ?Patient not taking: Reported on 08/18/2017 08/20/16   Fontaine, Belinda Block, MD  ?   ? ?Allergies    ?Patient has no known allergies.   ? ?Review of Systems   ?Review of Systems  ?Constitutional:  Negative for chills and fever.  ?Respiratory:  Negative for cough and shortness of breath.   ?Cardiovascular:  Negative for chest pain.  ?Gastrointestinal:  Negative for abdominal pain, nausea and vomiting.  ?All other systems reviewed and are  negative. ? ?Physical Exam ?Updated Vital Signs ?BP (!) 173/114 (BP Location: Left Arm)   Pulse (!) 116   Temp 97.9 ?F (36.6 ?C) (Oral)   Resp 18   Ht '5\' 3"'$  (1.6 m)   Wt 133 kg   LMP 08/03/2021 Comment: Nexalon removed 4 weeks ago  SpO2 100%   BMI 51.94 kg/m?  ?Physical Exam ?Vitals and nursing note reviewed.  ?Constitutional:   ?   General: She is not in acute distress. ?   Appearance: She is not diaphoretic.  ?HENT:  ?   Head: Normocephalic and atraumatic.  ?   Mouth/Throat:  ?   Pharynx: No oropharyngeal exudate.  ?Eyes:  ?   General: No scleral  icterus. ?   Conjunctiva/sclera: Conjunctivae normal.  ?Cardiovascular:  ?   Rate and Rhythm: Normal rate and regular rhythm.  ?   Pulses: Normal pulses.  ?   Heart sounds: Normal heart sounds.  ?Pulmonary:  ?   Effort: Pulmonary effort is normal. No respiratory distress.  ?   Breath sounds: Normal breath sounds. No wheezing.  ?Chest:  ?   Chest wall: No tenderness.  ?   Comments: No chest wall tenderness to palpation. ?Abdominal:  ?   General: Bowel sounds are normal.  ?   Palpations: Abdomen is soft. There is no mass.  ?   Tenderness: There is no abdominal tenderness. There is no guarding or rebound.  ?Musculoskeletal:     ?   General: Normal range of motion.  ?   Cervical back: Normal range of motion and neck supple.  ?   Comments: No appreciable unilateral swelling noted on exam.  Pedal pulses intact bilaterally.  No tenderness to palpation noted to bilateral lower extremities.  ?Skin: ?   General: Skin is warm and dry.  ?Neurological:  ?   Mental Status: She is alert.  ?Psychiatric:     ?   Behavior: Behavior normal.  ? ? ?ED Results / Procedures / Treatments   ?Labs ?(all labs ordered are listed, but only abnormal results are displayed) ?Labs Reviewed  ?BASIC METABOLIC PANEL  ?CBC  ?PREGNANCY, URINE  ?TROPONIN I (HIGH SENSITIVITY)  ? ? ?EKG ?EKG Interpretation ? ?Date/Time:  Monday Aug 31 2021 16:17:13 EDT ?Ventricular Rate:  116 ?PR Interval:  124 ?QRS Duration: 86 ?QT Interval:  336 ?QTC Calculation: 467 ?R Axis:   -1 ?Text Interpretation: Sinus tachycardia Moderate voltage criteria for LVH, may be normal variant ( R in aVL , Cornell product ) Nonspecific T wave abnormality Abnormal ECG When compared with ECG of 27-Aug-2021 14:18, PREVIOUS ECG IS PRESENT No significant change was found Confirmed by Ezequiel Essex [phone]) on 08/31/2021 4:22:09 PM ? ?Radiology ?No results found. ? ?Procedures ?Procedures  ? ? ?Medications Ordered in ED ?Medications - No data to display ? ?ED Course/ Medical Decision Making/  A&P ?  ? ?                        ?Medical Decision Making ?Amount and/or Complexity of Data Reviewed ?Labs: ordered. ?Radiology: ordered. ? ? ?Pt presents with tachycardia onset today.  Denies chest pain or shortness of breath.  Has taken her hydrochlorothiazide as prescribed since 08/27/2021.  Vital signs, mildly tachycardic at 116 initially otherwise no hypoxia, patient afebrile.  On exam patient without chest wall tenderness to palpation.  Mild tachycardia noted on exam.  Otherwise no acute respiratory exam findings.  Pedal pulses intact bilaterally.  No appreciable  leg swelling appreciated bilaterally.  Differential diagnosis includes anemia, arrhythmia, DVT, PE. ? ? ?Additional history obtained:  ?External records from outside source obtained and reviewed including: Patient was evaluated at urgent care in the emergency department on 08/27/2021.  At urgent care she was started on hydrochlorothiazide.  She was evaluated in the emergency department later that day for elevated blood pressure.  Had a negative work-up at that time. ? ?Labs:  ?I ordered, and personally interpreted labs.  The pertinent results include:   ?CBC, BMP, troponin, pregnancy urine, D-dimer ordered with results pending at time of signout. ? ?Imaging: ?I ordered imaging studies including chest x-ray, bilateral DVT study, CTA PE ?I independently visualized and interpreted imaging which showed: No acute cardiopulmonary findings ?I agree with the radiologist interpretation ? ?Patient case discussed with Lorin Roemhildt, PA-C at sign-out. Plan at sign-out is pending lab and imaging findings, likely discharge if no acute abnormalities. Dispo may change pending oncoming team. Patient care transferred at sign out.  ? ? ?This chart was dictated using voice recognition software, Dragon. Despite the best efforts of this provider to proofread and correct errors, errors may still occur which can change documentation meaning. ? ?Final Clinical Impression(s)  / ED Diagnoses ?Final diagnoses:  ?Tachycardia  ? ? ?Rx / DC Orders ?ED Discharge Orders   ? ? None  ? ?  ? ? ?  ?Deitrich Steve A, PA-C ?08/31/21 1911 ? ?  ?Ezequiel Essex, MD ?09/01/21 0119 ? ?

## 2021-09-02 ENCOUNTER — Encounter: Payer: Self-pay | Admitting: Family

## 2021-09-02 ENCOUNTER — Telehealth: Payer: Self-pay | Admitting: Family

## 2021-09-02 ENCOUNTER — Ambulatory Visit (INDEPENDENT_AMBULATORY_CARE_PROVIDER_SITE_OTHER): Payer: 59 | Admitting: Family

## 2021-09-02 VITALS — BP 128/86 | HR 104 | Temp 98.0°F | Ht 63.0 in | Wt 283.2 lb

## 2021-09-02 DIAGNOSIS — R319 Hematuria, unspecified: Secondary | ICD-10-CM | POA: Diagnosis not present

## 2021-09-02 DIAGNOSIS — Z0001 Encounter for general adult medical examination with abnormal findings: Secondary | ICD-10-CM

## 2021-09-02 DIAGNOSIS — R000 Tachycardia, unspecified: Secondary | ICD-10-CM

## 2021-09-02 DIAGNOSIS — I10 Essential (primary) hypertension: Secondary | ICD-10-CM

## 2021-09-02 DIAGNOSIS — R350 Frequency of micturition: Secondary | ICD-10-CM | POA: Diagnosis not present

## 2021-09-02 DIAGNOSIS — Z0000 Encounter for general adult medical examination without abnormal findings: Secondary | ICD-10-CM | POA: Diagnosis not present

## 2021-09-02 LAB — POCT URINALYSIS DIPSTICK
Bilirubin, UA: NEGATIVE
Glucose, UA: NEGATIVE
Ketones, UA: NEGATIVE
Leukocytes, UA: NEGATIVE
Nitrite, UA: NEGATIVE
Protein, UA: NEGATIVE
Spec Grav, UA: 1.01 (ref 1.010–1.025)
Urobilinogen, UA: 0.2 E.U./dL
pH, UA: 6 (ref 5.0–8.0)

## 2021-09-02 LAB — CBC WITH DIFFERENTIAL/PLATELET
Basophils Absolute: 0 10*3/uL (ref 0.0–0.1)
Basophils Relative: 0.4 % (ref 0.0–3.0)
Eosinophils Absolute: 0 10*3/uL (ref 0.0–0.7)
Eosinophils Relative: 0.1 % (ref 0.0–5.0)
HCT: 41.9 % (ref 36.0–46.0)
Hemoglobin: 13.8 g/dL (ref 12.0–15.0)
Lymphocytes Relative: 26.9 % (ref 12.0–46.0)
Lymphs Abs: 1.8 10*3/uL (ref 0.7–4.0)
MCHC: 33 g/dL (ref 30.0–36.0)
MCV: 87.7 fl (ref 78.0–100.0)
Monocytes Absolute: 0.5 10*3/uL (ref 0.1–1.0)
Monocytes Relative: 8.1 % (ref 3.0–12.0)
Neutro Abs: 4.3 10*3/uL (ref 1.4–7.7)
Neutrophils Relative %: 64.5 % (ref 43.0–77.0)
Platelets: 403 10*3/uL — ABNORMAL HIGH (ref 150.0–400.0)
RBC: 4.78 Mil/uL (ref 3.87–5.11)
RDW: 13.4 % (ref 11.5–15.5)
WBC: 6.7 10*3/uL (ref 4.0–10.5)

## 2021-09-02 LAB — LIPID PANEL
Cholesterol: 174 mg/dL (ref 0–200)
HDL: 44.9 mg/dL (ref >39.00)
LDL Cholesterol: 120 mg/dL — ABNORMAL HIGH (ref 0–99)
NonHDL: 128.98
Total CHOL/HDL Ratio: 4
Triglycerides: 47 mg/dL (ref 0.0–149.0)
VLDL: 9.4 mg/dL (ref 0.0–40.0)

## 2021-09-02 LAB — COMPREHENSIVE METABOLIC PANEL
ALT: 20 U/L (ref 0–35)
AST: 22 U/L (ref 0–37)
Albumin: 4.4 g/dL (ref 3.5–5.2)
Alkaline Phosphatase: 49 U/L (ref 39–117)
BUN: 8 mg/dL (ref 6–23)
CO2: 28 mEq/L (ref 19–32)
Calcium: 10.1 mg/dL (ref 8.4–10.5)
Chloride: 92 mEq/L — ABNORMAL LOW (ref 96–112)
Creatinine, Ser: 0.87 mg/dL (ref 0.40–1.20)
GFR: 85.6 mL/min (ref >60.00)
Glucose, Bld: 102 mg/dL — ABNORMAL HIGH (ref 70–99)
Potassium: 3.3 mEq/L — ABNORMAL LOW (ref 3.5–5.1)
Sodium: 130 mEq/L — ABNORMAL LOW (ref 135–145)
Total Bilirubin: 0.4 mg/dL (ref 0.2–1.2)
Total Protein: 8.8 g/dL — ABNORMAL HIGH (ref 6.0–8.3)

## 2021-09-02 LAB — TSH: TSH: 1.44 u[IU]/mL (ref 0.35–5.50)

## 2021-09-02 MED ORDER — PROPRANOLOL HCL 10 MG PO TABS: 10.0000 mg | ORAL_TABLET | Freq: Three times a day (TID) | ORAL | 0 refills | Status: DC | PRN

## 2021-09-02 MED ORDER — OLMESARTAN MEDOXOMIL-HCTZ 20-12.5 MG PO TABS: 1.0000 | ORAL_TABLET | Freq: Every day | ORAL | 0 refills | Status: DC

## 2021-09-02 NOTE — Telephone Encounter (Signed)
This will need to be addressed at her follow up visit. thx ?

## 2021-09-02 NOTE — Telephone Encounter (Signed)
Pt would like to have a referral for neurology to work on sleep issues. Previous practice does not have anything available until November. ? ?Please send to "[facility]" ?

## 2021-09-02 NOTE — Patient Instructions (Addendum)
Welcome to [facility]! It was a pleasure meeting you today. ? ?Go to the lab for blood work today. ?As discussed, I have sent a new medication, Olmesartan-Hydrochlorothiazide to your pharmacy, you will take this every morning and stop the Hydrochlorothiazide you have now.  ?You can take Magnesium Glycinate, L-threonate (magtein) or oxide per directions on the bottle at bedtime to help with sleep maintenance (not onset) and can help lower blood pressure. ?Check your insurance plan to see if any weight loss meds are covered: Saxenda, Wegovy, Ozempic, Darcel Bayley - all injectables. ?Drink at least 2L of water daily with a low sodium diet. ?Please schedule a 1 month follow up visit today. ? ? ?PLEASE NOTE: ? ?If you had any LAB tests please let us know if you have not heard back within a few days. You may see your results on MyChart before we have a chance to review them but we will give you a call once they are reviewed by [place]. If we ordered any REFERRALS today, please let us know if you have not heard from their office within the next week.  ?Let us know through MyChart if you are needing REFILLS, or have your pharmacy send [place] the request. You can also use MyChart to communicate with me or any office staff. ? ?Please try these tips to maintain a healthy lifestyle: ? ?Eat most of your calories during the day when you are active. Eliminate processed foods including packaged sweets (pies, cakes, cookies), reduce intake of potatoes, white bread, white pasta, and white rice. Look for whole grain options, oat flour or almond flour. ? ?Each meal should contain half fruits/vegetables, one quarter protein, and one quarter carbs (no bigger than a computer mouse). ? ?Cut down on sweet beverages. This includes juice, soda, and sweet tea. Also watch fruit intake, though this is a healthier sweet option, it still contains natural sugar! Limit to 3 servings daily. ? ?Drink at least 1 glass of water with  each meal and aim for at least 8 glasses per day ? ?Exercise at least 150 minutes every week.  ? ?

## 2021-09-02 NOTE — Progress Notes (Signed)
? ?New Patient Office Visit ? ?Subjective:  ?Patient ID: Cassie Duncan, female    DOB: Sep 10, 1985  Age: 36 y.o. MRN: [id number] ? ?CC:  ?Chief Complaint  ?Patient presents with  ? Establish Care  ? Hypertension  ?  Recently diagnosed with hypertension.   ? Annual Exam  ? ? ?HPI ?Cassie Duncan presents for establishing care and an annual physical. ? ?Obesity: Patient complains of obesity. Patient cites health, increased physical ability, self-image as reasons for wanting to lose weight. Amount of time at present weight: years, but has lost 15lbs over last 67mo. Circumstances associated with regain of weight: stress, poor food choices, no exercise. Successful weight loss techniques attempted: self-directed dieting. Unsuccessful weight loss techniques attempted: very low calorie diet and Weight Watchers. Exercise: walking.  Eating precipitated by stress? sometimes. Use of alcohol: average 0 drinks/week. Use of medications that may cause weight gain none.  ?Hypertension & Tachycardia: Patient is currently maintained on the following medications for blood pressure: HCTZ ?Failed meds include: HCTZ- caused electrolyte imbalance ?Patient reports good compliance with blood pressure medications. ?Patient denies chest pain, headaches, shortness of breath or swelling. pt does report rapid heart rate and palpitations, uneasy feeling. ?Last 3 blood pressure readings in our office are as follows: ?BP Readings from Last 3 Encounters:  ?09/02/21 128/86  ?08/31/21 (!) 146/59  ?08/27/21 (!) 158/96  ?  ? ?Assessment & Plan:  ? ?Problem List Items Addressed This Visit   ? ?  ? Cardiovascular and Mediastinum  ? Essential hypertension - Primary  ?  Chronic - pt has had in past, but denies being treated until a year ago and started on HCTZ low dose, then increased to '25mg'$  later in the year. Seen in ER in April with elevated BP and anxiety. Given Vistaril. Then returned to ER on 5/1 with tachycardia and palpitations, all cardiac testing  negative, but with low sodium & potassium, replacements given, rechecking labs today. ? ?  ?  ? Relevant Medications  ? olmesartan-hydrochlorothiazide (BENICAR HCT) 20-12.5 MG tablet  ? propranolol (INDERAL) 10 MG tablet  ? Other Relevant Orders  ? Comprehensive metabolic panel (Completed)  ?  ? Other  ? Morbid obesity ([place]  ?  chronic - Wt. Loss strategies reviewed including portion control, less carbs including sweets, eating most of calories earlier in day, drinking 64oz water qd, and establishing daily exercise routine. Referring to nutritionist.  ? ?  ?  ? Relevant Orders  ? Referral to Nutrition and Diabetes Services  ? Tachycardia with hypertension  ?  New- w/palpitations, making pt very anxious, taking off from work this week d/t all of her sx. Starting Propranolol, advised on use & SE, f/u in 1 month. ? ?  ?  ? Relevant Medications  ? propranolol (INDERAL) 10 MG tablet  ? ?Other Visit Diagnoses   ? ? Encounter for general adult medical examination with abnormal findings      ? Relevant Orders  ? TSH (Completed)  ? Lipid panel (Completed)  ? CBC with Differential/Platelet (Completed)  ? POCT Urinalysis Dipstick (Completed)  ? ?  ? ?Past Medical History:  ?Diagnosis Date  ? Elevated BP without diagnosis of hypertension 02/22/2020  ? Obesity (BMI 30-39.9) 03/19/2011  ? ? ?Past Surgical History:  ?Procedure Laterality Date  ? [place]  ? ? ?Objective:  ? ?Today's Vitals: BP 128/86 (BP Location: Left Arm, Patient Position: Sitting, Cuff Size: Large)   Pulse (!) 104   Temp 98 ?F (  36.7 ?C) (Temporal)   Ht '5\' 3"'$  (1.6 m)   Wt 283 lb 4 oz (128.5 kg)   LMP 08/03/2021 (Exact Date) Comment: Nexplanon removed 4 weeks ago  SpO2 100%   BMI 50.18 kg/m?  ? ?Physical Exam ?Vitals and nursing note reviewed.  ?Constitutional:   ?   Appearance: Normal appearance. She is morbidly obese.  ?HENT:  ?   Head: Normocephalic.  ?   Right Ear: Tympanic membrane normal.  ?   Left Ear: Tympanic membrane normal.   ?   Nose: Nose normal.  ?   Mouth/Throat:  ?   Mouth: Mucous membranes are moist.  ?Eyes:  ?   Pupils: Pupils are equal, round, and reactive to light.  ?Cardiovascular:  ?   Rate and Rhythm: Normal rate and regular rhythm.  ?Pulmonary:  ?   Effort: Pulmonary effort is normal.  ?   Breath sounds: Normal breath sounds.  ?Musculoskeletal:     ?   General: Normal range of motion.  ?   Cervical back: Normal range of motion.  ?Lymphadenopathy:  ?   Cervical: No cervical adenopathy.  ?Skin: ?   General: Skin is warm and dry.  ?Neurological:  ?   Mental Status: She is alert.  ?Psychiatric:     ?   Mood and Affect: Mood normal.     ?   Behavior: Behavior normal.  ? ? ?Outpatient Encounter Medications as of 09/02/2021  ?Medication Sig  ? ascorbic acid (VITAMIN C) 250 MG tablet Take 1 tablet by mouth daily.  ? Cholecalciferol 10 MCG (400 UNIT) CAPS Take by mouth.  ? ELDERBERRY PO Take by mouth.  ? folic acid (FOLVITE) 401 MCG tablet Take 400 mcg by mouth 2 (two) times daily. Reported on 09/05/2015  ? Mefenamic Acid 250 MG CAPS Take one cap po q6h prn menstrual cramps.  ? Multiple Vitamin (MULTIVITAMIN) capsule Take 1 capsule by mouth daily.    ? olmesartan-hydrochlorothiazide (BENICAR HCT) 20-12.5 MG tablet Take 1 tablet by mouth daily.  ? Omega 3-6-9 Fatty Acids (ADULT OMEGA PLUS DHA PO) Take by mouth.  ? Omega-3 Fatty Acids (FISH OIL) 1000 MG CAPS Take by mouth.  ? OVER THE COUNTER MEDICATION CTS 360 DIETARY SUPPLEMENT .Marland Kitchen429 MG.. TAKES TWICE A DAY. [place] MG TWICE ADAY   ? potassium bicarbonate (K-LYTE) 25 MEQ disintegrating tablet Take by mouth.  ? propranolol (INDERAL) 10 MG tablet Take 1 tablet (10 mg total) by mouth 3 (three) times daily as needed (Anxiety or increased heart rate or palpitations).  ? Safflower Oil (CLA) 1000 MG CAPS Take 1,000 mg by mouth daily.    ? zinc sulfate 220 (50 Zn) MG capsule Take by mouth.  ? [DISCONTINUED] Ascorbic Acid (VITAMIN C) 1000 MG tablet Take 1,000 mg by mouth  daily.  ? [DISCONTINUED] clindamycin (CLEOCIN) 2 % vaginal cream APPLY INTRAVAGINALLY X 5 NIGHTS AT BEDTIME.  ? [DISCONTINUED] Cyanocobalamin (B-12) 1000 MCG CAPS Take by mouth.  ? [DISCONTINUED] hydrochlorothiazide (HYDRODIURIL) 25 MG tablet Take 25 mg by mouth daily.  ? [DISCONTINUED] hydrOXYzine (ATARAX) 25 MG tablet Take 1 tablet (25 mg total) by mouth every 8 (eight) hours as needed.  ? [DISCONTINUED] MICROGESTIN FE 1/20 1-20 MG-MCG tablet TAKE 1 TABLET BY MOUTH DAILY AS DIRECTED**TAKE ONLY 21 TABS, THEN EVERY THIRD PACK TAKE ALL 28 TABS**  ? [DISCONTINUED] Hydrocodone-Acetaminophen 5-300 MG TABS Take one tablet po q 4-6 hours prn pain. (Patient not taking: Reported on 08/18/2017)  ? [DISCONTINUED]  phentermine 37.5 MG capsule Take 1 capsule (37.5 mg total) by mouth every morning.  ? [DISCONTINUED] traMADol (ULTRAM) 50 MG tablet Take 1 tablet (50 mg total) by mouth every 6 (six) hours as needed. (Patient not taking: Reported on 08/18/2017)  ? ?No facility-administered encounter medications on file as of 09/02/2021.  ? ? ?Follow-up: Return in about 4 weeks (around 09/30/2021) for HTN.  ? ?Jeanie Sewer, NP ?

## 2021-09-03 ENCOUNTER — Other Ambulatory Visit: Payer: Self-pay | Admitting: Family

## 2021-09-03 ENCOUNTER — Encounter: Payer: Self-pay | Admitting: Family

## 2021-09-03 ENCOUNTER — Telehealth: Payer: Self-pay | Admitting: Family

## 2021-09-03 DIAGNOSIS — I10 Essential (primary) hypertension: Secondary | ICD-10-CM | POA: Insufficient documentation

## 2021-09-03 DIAGNOSIS — E876 Hypokalemia: Secondary | ICD-10-CM

## 2021-09-03 DIAGNOSIS — E878 Other disorders of electrolyte and fluid balance, not elsewhere classified: Secondary | ICD-10-CM

## 2021-09-03 LAB — URINE CULTURE
MICRO NUMBER:: 13346121
SPECIMEN QUALITY:: ADEQUATE

## 2021-09-03 MED ORDER — POTASSIUM CHLORIDE CRYS ER 20 MEQ PO TBCR: 20.0000 meq | EXTENDED_RELEASE_TABLET | Freq: Every day | ORAL | 0 refills | Status: DC

## 2021-09-03 NOTE — Telephone Encounter (Signed)
Patient is seeing her mychart lab results- Patient would like a call back - asked if I see anything alarming- patient was informed she would get a call back from provider or team-  ?

## 2021-09-03 NOTE — Assessment & Plan Note (Addendum)
New- w/palpitations, making pt very anxious, taking off from work this week d/t all of her sx. Starting Propranolol, advised on use & SE, f/u in 1 month. ?

## 2021-09-03 NOTE — Telephone Encounter (Signed)
Called and spoke with pt, let her know Colletta [place] will be reviewing her labs. And she will see it through mychart or I will be giving her a call.  ?

## 2021-09-03 NOTE — Assessment & Plan Note (Addendum)
Chronic - pt has had in past, but denies being treated until a year ago and started on HCTZ low dose, then increased to '25mg'$  later in the year. Seen in ER in April with elevated BP and anxiety. Given Vistaril. Then returned to ER on 5/1 with tachycardia and palpitations, all cardiac testing negative, but with low sodium & potassium, replacements given, rechecking labs today. ?

## 2021-09-03 NOTE — Progress Notes (Signed)
Hi Zuriel, ? ?Your glucose is mildly elevated, but this is ok since non-fasting, your blood count, thyroid, liver & kidney function are all normal. ? ?Your bad cholesterol number, LDL, is high. ?Need to reduce any fried foods, alcohol, nonnutritional snacks e.g. chips/cookies,pies, cakes and candies, fatty meat (red meat), high fat dairy foods:  including cheese, milk, ice cream.  ?Increase fruits/vegetables/fiber.   ?Continue or restart an exercise routine, shooting for 18mn 5-7days per week.  ? ?Your sodium level is low, restrict your water intake to less than 2 liters daily, around 1.5 liters/day for the next week. ?The potassium is also low again and I have sent a potassium replacement pill to your pharmacy to take for 1 week and come back for a lab only appointment after you finish the pills, you can call the office to schedule this. ?Both of these levels should even out since we stopped the higher dose of HCTZ and reduced the amount in the combined pill you are now taking, Olmesartan-HCTZ. But we will see what you labs look like in a week, as we may have to discontinue the HCTZ altogether. ? ?Let me know if you have any questions. ?

## 2021-09-03 NOTE — Assessment & Plan Note (Signed)
chronic - Wt. Loss strategies reviewed including portion control, less carbs including sweets, eating most of calories earlier in day, drinking 64oz water qd, and establishing daily exercise routine. Referring to nutritionist.  ?

## 2021-09-04 ENCOUNTER — Ambulatory Visit: Payer: Self-pay | Admitting: Family Medicine

## 2021-09-07 ENCOUNTER — Other Ambulatory Visit: Payer: Self-pay

## 2021-09-07 ENCOUNTER — Telehealth: Payer: Self-pay

## 2021-09-07 ENCOUNTER — Encounter: Payer: Self-pay | Admitting: Family

## 2021-09-07 DIAGNOSIS — E876 Hypokalemia: Secondary | ICD-10-CM

## 2021-09-07 MED ORDER — POTASSIUM CHLORIDE CRYS ER 20 MEQ PO TBCR: 20.0000 meq | EXTENDED_RELEASE_TABLET | Freq: Every day | ORAL | 0 refills | Status: DC

## 2021-09-07 NOTE — Telephone Encounter (Signed)
Returned pt call and spoke for 20 minutes answering her questions regarding lab results, medications, and blood pressure. pt states she is still not feeling well. Advised she can feel bad with low electrolytes and high blood pressure. She has started the potassium supplement and restricting total fluid intake to 50oz. She will return Friday to have labs rechecked, she is taking the olmesartan-Hctz and checking her BP at home. Advised ok to move up her 1 mo. f/u appt if needed.

## 2021-09-07 NOTE — Telephone Encounter (Signed)
Pt state she would like to know more about her labs and has further questions. Please contact her after 5pm today at [phone].  ?

## 2021-09-08 ENCOUNTER — Telehealth: Payer: Self-pay | Admitting: Family

## 2021-09-08 NOTE — Telephone Encounter (Signed)
yes, ok to take vitamins with her current medications, just no extra potassium, only the one I prescribed.

## 2021-09-08 NOTE — Telephone Encounter (Signed)
Patient sch lab apt for 09/11/21 no further action needed.  ? ? ?Patient wants to know if her vitamin levels are ok, and if she can take vitamins with her current medications,. ?

## 2021-09-09 NOTE — Telephone Encounter (Signed)
Lvm for pt

## 2021-09-10 DIAGNOSIS — R0609 Other forms of dyspnea: Secondary | ICD-10-CM | POA: Insufficient documentation

## 2021-09-11 ENCOUNTER — Telehealth: Payer: Self-pay

## 2021-09-11 ENCOUNTER — Other Ambulatory Visit (INDEPENDENT_AMBULATORY_CARE_PROVIDER_SITE_OTHER): Payer: 59

## 2021-09-11 DIAGNOSIS — E878 Other disorders of electrolyte and fluid balance, not elsewhere classified: Secondary | ICD-10-CM | POA: Diagnosis not present

## 2021-09-11 LAB — BASIC METABOLIC PANEL
BUN: 8 mg/dL (ref 6–23)
CO2: 28 mEq/L (ref 19–32)
Calcium: 9.5 mg/dL (ref 8.4–10.5)
Chloride: 97 mEq/L (ref 96–112)
Creatinine, Ser: 0.89 mg/dL (ref 0.40–1.20)
GFR: 83.28 mL/min (ref >60.00)
Glucose, Bld: 91 mg/dL (ref 70–99)
Potassium: 3.8 mEq/L (ref 3.5–5.1)
Sodium: 133 mEq/L — ABNORMAL LOW (ref 135–145)

## 2021-09-11 NOTE — Telephone Encounter (Signed)
Patient has called in wanting to know if she can stop potassium. After speaking with Dejase I have advised patient to stop potassium for one week and scheduled a lab appt for next week 5/19 for recheck.

## 2021-09-11 NOTE — Telephone Encounter (Addendum)
Patient states she was just diagnosed with high BP.  Would like to know what she can take OTC for menstrual cramps?

## 2021-09-13 ENCOUNTER — Other Ambulatory Visit: Payer: Self-pay | Admitting: Family

## 2021-09-13 NOTE — Telephone Encounter (Signed)
2 Extra strength tylenol q4h

## 2021-09-14 NOTE — Telephone Encounter (Signed)
Pt gave a verbalized understanding.  ?

## 2021-09-15 DIAGNOSIS — G4733 Obstructive sleep apnea (adult) (pediatric): Secondary | ICD-10-CM | POA: Diagnosis not present

## 2021-09-16 ENCOUNTER — Telehealth: Payer: Self-pay | Admitting: Family

## 2021-09-16 NOTE — Telephone Encounter (Signed)
I called and spoke with pt. Appt scheduled for 09/18/2021. ?

## 2021-09-16 NOTE — Telephone Encounter (Signed)
Pt is asking for a call back to discuss previous labs ?

## 2021-09-18 ENCOUNTER — Ambulatory Visit (INDEPENDENT_AMBULATORY_CARE_PROVIDER_SITE_OTHER): Payer: 59 | Admitting: Family

## 2021-09-18 ENCOUNTER — Encounter: Payer: Self-pay | Admitting: Family

## 2021-09-18 ENCOUNTER — Other Ambulatory Visit: Payer: 59

## 2021-09-18 VITALS — BP 112/68 | HR 75 | Temp 98.0°F | Ht 63.0 in | Wt 275.0 lb

## 2021-09-18 DIAGNOSIS — E878 Other disorders of electrolyte and fluid balance, not elsewhere classified: Secondary | ICD-10-CM | POA: Diagnosis not present

## 2021-09-18 DIAGNOSIS — I10 Essential (primary) hypertension: Secondary | ICD-10-CM

## 2021-09-18 LAB — BASIC METABOLIC PANEL
BUN: 6 mg/dL (ref 6–23)
CO2: 29 mEq/L (ref 19–32)
Calcium: 9.7 mg/dL (ref 8.4–10.5)
Chloride: 101 mEq/L (ref 96–112)
Creatinine, Ser: 0.9 mg/dL (ref 0.40–1.20)
GFR: 82.16 mL/min (ref >60.00)
Glucose, Bld: 87 mg/dL (ref 70–99)
Potassium: 3.9 mEq/L (ref 3.5–5.1)
Sodium: 137 mEq/L (ref 135–145)

## 2021-09-18 NOTE — Assessment & Plan Note (Signed)
Chronic - pt seen by Cardiology recently, Olmesartan-HCTZ & propranolol stopped, started on Labetolol & Amlodipine, but given BP parameters to not take med, so she has only taken Amlodipine 2 times, (don't take if BP <125 and Labetolol if <660, systolic. Still c/o not feeling well, having some chest pressure and numbness in a few toes & fingers.Rechecking her BMP today, & she is scheduled for echo & stress test in June via [facility] and f/u with them again in August.

## 2021-09-18 NOTE — Patient Instructions (Addendum)
It was very nice to see you today!  Go to the lab for blood work today.  Continue your medications as directed. Try not to check you blood pressure too often. Once in the am & pm is enough.  If you find a nutritionist that can see you sooner, let me know and I will refer you if needed.  Keep up the good work losing weight, shoot for 20-30 minutes of exercise daily and monitoring your calorie intake, eating most of your calories earlier in the day.  Have a great weekend!   PLEASE NOTE:  If you had any lab tests please let us know if you have not heard back within a few days. You may see your results on MyChart before we have a chance to review them but we will give you a call once they are reviewed by [place]. If we ordered any referrals today, please let us know if you have not heard from their office within the next week.   Please try these tips to maintain a healthy lifestyle:  Eat most of your calories during the day when you are active. Eliminate processed foods including packaged sweets (pies, cakes, cookies), reduce intake of potatoes, white bread, white pasta, and white rice. Look for whole grain options, oat flour or almond flour.  Each meal should contain half fruits/vegetables, one quarter protein, and one quarter carbs (no bigger than a computer mouse).  Cut down on sweet beverages. This includes juice, soda, and sweet tea. Also watch fruit intake, though this is a healthier sweet option, it still contains natural sugar! Limit to 3 servings daily.  Drink at least 1 glass of water with each meal and aim for at least 8 glasses per day  Exercise at least 150 minutes every week.

## 2021-09-18 NOTE — Progress Notes (Signed)
Subjective:     Patient ID: Cassie Duncan, female    DOB: 02/09/1986, 36 y.o.   MRN: [id number]  Chief Complaint  Patient presents with   Follow-up    Discuss Labs, BMP recheck   HPI: Electrolyte imbalance: previously had low potassium and sodium, HCTZ and water intake reduced, potassium supplement started. BMP rechecked one week ago and potassium in normal range and sodium still slightly low. HCTZ and potassium supplement stopped, rechecking BMP today. Pt reports still not feeling well overall, has occasional numbness in 2 of her toes, and a few fingers, chest pressure at times. Hypertension: Patient is currently maintained on the following medications for blood pressure: Labetolol and Amlodipine. Patient reports good compliance with blood pressure medications. Patient denies chest pain, headaches, shortness of breath or swelling. Does report chest pressure and numbness in a few fingers, toes. BP readings all over the place at home, has switched out her machines and still having up & down readings. Last 3 blood pressure readings in our office are as follows: BP Readings from Last 3 Encounters:  09/18/21 112/68  09/02/21 128/86  08/31/21 (!) 146/59    Assessment & Plan:   Problem List Items Addressed This Visit       Cardiovascular and Mediastinum   Essential hypertension    Chronic - pt seen by Cardiology recently, Olmesartan-HCTZ & propranolol stopped, started on Labetolol & Amlodipine, but given BP parameters to not take med, so she has only taken Amlodipine 2 times, (don't take if BP <125 and Labetolol if <503, systolic. Still c/o not feeling well, having some chest pressure and numbness in a few toes & fingers.Rechecking her BMP today, & she is scheduled for echo & stress test in June via [facility] and f/u with them again in August.        Relevant Medications   amLODipine (NORVASC) 2.5 MG tablet   labetalol (NORMODYNE) 100 MG tablet   Other Visit Diagnoses     Electrolyte  imbalance    -  Primary   previously had low potassium and sodium, HCTZ and water intake reduced, potassium supplement started. BMP rechecked one week ago and potassium in normal range and sodium still slightly low. HCTZ and potassium supplement stopped, rechecking BMP today. Pt reports still not feeling well overall, has occasional numbness in 2 of her toes, and a few fingers, chest pressure at times, possible SE to new BP meds, pt to f/u with Cardiology.  Relevant Orders   Basic Metabolic Panel (BMET)      Outpatient Medications Prior to Visit  Medication Sig Dispense Refill   amLODipine (NORVASC) 2.5 MG tablet Take 2.5 mg by mouth daily.     ascorbic acid (VITAMIN C) 250 MG tablet Take 1 tablet by mouth daily.     Cholecalciferol 10 MCG (400 UNIT) CAPS Take by mouth.     ELDERBERRY PO Take by mouth.     labetalol (NORMODYNE) 100 MG tablet PLEASE SEE ATTACHED FOR DETAILED DIRECTIONS     Mefenamic Acid 250 MG CAPS Take one cap po q6h prn menstrual cramps. 30 each 5   Multiple Vitamin (MULTIVITAMIN) capsule Take 1 capsule by mouth daily.       Omega 3-6-9 Fatty Acids (ADULT OMEGA PLUS DHA PO) Take by mouth.     Omega-3 Fatty Acids (FISH OIL) 1000 MG CAPS Take by mouth.     OVER THE COUNTER MEDICATION CTS 360 DIETARY SUPPLEMENT .Marland Kitchen429 MG.. TAKES TWICE A DAY. [place]  MG TWICE ADAY      folic acid (FOLVITE) 147 MCG tablet Take 400 mcg by mouth 2 (two) times daily. Reported on 09/05/2015     potassium chloride SA (KLOR-CON M) 20 MEQ tablet Take 1 tablet (20 mEq total) by mouth daily. 7 tablet 0   Safflower Oil (CLA) 1000 MG CAPS Take 1,000 mg by mouth daily.       zinc sulfate 220 (50 Zn) MG capsule Take by mouth.     No facility-administered medications prior to visit.    Past Medical History:  Diagnosis Date   Elevated BP without diagnosis of hypertension 02/22/2020   Obesity (BMI 30-39.9) 03/19/2011    Past Surgical History:  Procedure Laterality Date    WISDOM TOOTH EXTRACTION  2005    No Known Allergies     Objective:    Physical Exam  BP 112/68 (BP Location: Left Arm, Patient Position: Sitting, Cuff Size: Large)   Pulse 75   Temp 98 F (36.7 C) (Temporal)   Ht '5\' 3"'$  (1.6 m)   Wt 275 lb (124.7 kg)   LMP 09/10/2021 (Exact Date)   SpO2 99%   BMI 48.71 kg/m  Wt Readings from Last 3 Encounters:  09/18/21 275 lb (124.7 kg)  09/02/21 283 lb 4 oz (128.5 kg)  08/31/21 293 lb 3.4 oz (133 kg)        No orders of the defined types were placed in this encounter.   Jeanie Sewer, NP

## 2021-09-21 NOTE — Progress Notes (Signed)
Hi Cassie Duncan,  Sodium is back to normal and potassium still looks good. You can increase your water intake back up to 2 - 2.5 liters daily, but just be careful to not go over 3 liters as this could drop your sodium level even without taking the Hydrochlorothiazide.

## 2021-09-25 ENCOUNTER — Ambulatory Visit (INDEPENDENT_AMBULATORY_CARE_PROVIDER_SITE_OTHER): Payer: 59 | Admitting: Primary Care

## 2021-09-25 ENCOUNTER — Encounter: Payer: Self-pay | Admitting: Primary Care

## 2021-09-25 VITALS — BP 118/78 | HR 85 | Ht 64.0 in | Wt 278.6 lb

## 2021-09-25 DIAGNOSIS — I10 Essential (primary) hypertension: Secondary | ICD-10-CM

## 2021-09-25 DIAGNOSIS — R0609 Other forms of dyspnea: Secondary | ICD-10-CM | POA: Diagnosis not present

## 2021-09-25 DIAGNOSIS — R000 Tachycardia, unspecified: Secondary | ICD-10-CM | POA: Diagnosis not present

## 2021-09-25 DIAGNOSIS — Z6841 Body Mass Index (BMI) 40.0 and over, adult: Secondary | ICD-10-CM | POA: Diagnosis not present

## 2021-09-25 DIAGNOSIS — G4733 Obstructive sleep apnea (adult) (pediatric): Secondary | ICD-10-CM

## 2021-09-25 DIAGNOSIS — R0789 Other chest pain: Secondary | ICD-10-CM

## 2021-09-25 NOTE — Progress Notes (Signed)
$'@Patient'u$  ID: Cassie Duncan, female    DOB: 24-Dec-1985, 36 y.o.   MRN: [id number]  Chief Complaint  Patient presents with   Consult    Referring provider: Marina Goodell, MD  HPI: 36 year old female, never smoked.  Past medical history significant for hypertension, OSA, morbid obesity, palpitations.  09/25/2021 Patient presents today for sleep consult.  She has symptoms of loud snoring. Dx with hypertension in April 2023. Following with cardiology for tachycardia. She has hx sleep apnea.  She was being followed by [facility] for OSA, last seen in January 2023. She had sleep study on 06/18/20 that showed moderate OSA, AHI 21.4/hr. with SpO2 low 87%. Started on CPAP in January 2023. She is working on weight loss.   She is not feeling like her self. Reports morning chest tightness and shallow breathing/dyspnea on exertion. She is working on weight loss. Takes daily walks. Tired all the time. Cardiology started her on labetalol '50mg'$  twice daily and Amlodipine 2.'5mg'$  daily. Denies nocturnal cough.    Airview download 06/07/21 - 09/24/2021 43/90 days (40%); 24 days (27%) greater than 4 hours Average usage days used 4 hours 43 minutes Pressure 5 to 15 cm H2O (8.1 cm H2O-95%) Air leaks 28.2 L/min (95%) AHI 0.6  Sleep questionnaire Symptoms- Hx sleep apnea, morning chest tightness, tension headache, fatigue Prior sleep study- Feb 2022 Bedtime- 10pm-12am Time to fall asleep- 30-27mns  Nocturnal awakenings- 1 time Out of bed/start of day- 6-7am Weight changes- +30 lbs/-30 lbs Do you operate heavy machinery- No Do you currently wear CPAP- Yes Do you current wear oxygen- No Epworth- 9  No Known Allergies  Immunization History  Administered Date(s) Administered   PFIZER(Purple Top)SARS-COV-2 Vaccination 09/02/2020, 10/07/2020   PPD Test 01/28/2016    Past Medical History:  Diagnosis Date   COVID-19    Elevated BP without diagnosis of hypertension 02/22/2020   Obesity (BMI  30-39.9) 03/19/2011   OSA (obstructive sleep apnea)     Tobacco History: Social History   Tobacco Use  Smoking Status Never  Smokeless Tobacco Never   Counseling given: Not Answered   Outpatient Medications Prior to Visit  Medication Sig Dispense Refill   amLODipine (NORVASC) 2.5 MG tablet Take 2.5 mg by mouth 3 (three) times a week.     ascorbic acid (VITAMIN C) 250 MG tablet Take 1 tablet by mouth daily.     Cholecalciferol 10 MCG (400 UNIT) CAPS Take by mouth.     ELDERBERRY PO Take by mouth.     labetalol (NORMODYNE) 100 MG tablet PLEASE SEE ATTACHED FOR DETAILED DIRECTIONS     Multiple Vitamin (MULTIVITAMIN) capsule Take 1 capsule by mouth daily.       Omega 3-6-9 Fatty Acids (ADULT OMEGA PLUS DHA PO) Take by mouth.     OVER THE COUNTER MEDICATION CTS 360 DIETARY SUPPLEMENT ..Marland Kitchen29 MG.. TAKES TWICE A DAY. TONALIN - DIETARY SUPPLEMENT 2000 MG TWICE ADAY      Omega-3 Fatty Acids (FISH OIL) 1000 MG CAPS Take by mouth.     Mefenamic Acid 250 MG CAPS Take one cap po q6h prn menstrual cramps. 30 each 5   No facility-administered medications prior to visit.    Review of Systems  Review of Systems  Constitutional:  Positive for fatigue.  HENT: Negative.    Respiratory:  Positive for chest tightness and shortness of breath.   Cardiovascular: Negative.    Physical Exam  BP 118/78 (BP Location: Left Arm, Patient Position: Sitting, Cuff Size: Large) Comment: rechecked  per pt request  Pulse 85   Ht '5\' 4"'$  (1.626 m)   Wt 278 lb 9.6 oz (126.4 kg)   LMP 09/10/2021 (Exact Date)   SpO2 100%   BMI 47.82 kg/m  Physical Exam Constitutional:      Appearance: Normal appearance.  HENT:     Head: Normocephalic and atraumatic.  Cardiovascular:     Rate and Rhythm: Normal rate and regular rhythm.  Pulmonary:     Effort: Pulmonary effort is normal.     Breath sounds: Normal breath sounds.  Neurological:     General: No focal deficit present.     Mental Status: She is alert and  oriented to person, place, and time. Mental status is at baseline.  Psychiatric:        Mood and Affect: Mood normal.        Behavior: Behavior normal.        Thought Content: Thought content normal.        Judgment: Judgment normal.      Lab Results:  CBC    Component Value Date/Time   WBC 6.7 09/02/2021 1421   RBC 4.78 09/02/2021 1421   HGB 13.8 09/02/2021 1421   HCT 41.9 09/02/2021 1421   PLT 403.0 (H) 09/02/2021 1421   MCV 87.7 09/02/2021 1421   MCH 29.4 08/31/2021 1821   MCHC 33.0 09/02/2021 1421   RDW 13.4 09/02/2021 1421   LYMPHSABS 1.8 09/02/2021 1421   MONOABS 0.5 09/02/2021 1421   EOSABS 0.0 09/02/2021 1421   BASOSABS 0.0 09/02/2021 1421    BMET    Component Value Date/Time   NA 137 09/18/2021 0909   K 3.9 09/18/2021 0909   CL 101 09/18/2021 0909   CO2 29 09/18/2021 0909   GLUCOSE 87 09/18/2021 0909   BUN 6 09/18/2021 0909   CREATININE 0.90 09/18/2021 0909   CREATININE 0.79 09/05/2015 1201   CALCIUM 9.7 09/18/2021 0909   GFRNONAA >60 08/31/2021 1821    BNP No results found for: "BNP"  ProBNP No results found for: "PROBNP"  Imaging: No results found.   Assessment & Plan:   OSA (obstructive sleep apnea) - Sleep study 06/18/20 showed moderate OSA, AHI 21.4/hr with SpO2 low 87%. Started on CPAP in January 2023. She is 40% compliant with CPAP use last 90 days, average usage 4 hours 43 mins. Pressure 5-15cm h20 (8.1cm h20-95%); Residual AHI 0.6. No pressure changes needed. Encourage patient continue aim to wear CPAP every night 4-6 hours or longer. She is actively working on weight loss. Checking ONO on CPAP. Referring to ENT for upper airway assessment for possible surgical options for OSA.  DOE (dyspnea on exertion) - Patient has symptoms of morning chest tightness and shallow breathing/dyspnea on exertion. Recommend getting pulmonary function testing and ONO on CPAP.   Tachycardia with hypertension - Dx with hypertension in April 2023, following  with cardiology. Started on Labetalol '50mg'$  BID and Amlodipine 2.'5mg'$  daily  Morbid obesity ([place]) - Refer to medical weight management   Martyn Ehrich, NP 12/10/2021

## 2021-09-25 NOTE — Patient Instructions (Addendum)
Download from CPAP machine shows excellent control of your sleep apnea on current cpap pressure setting   Recommendations: Continue to wear CPAP every night while sleeping for min 4-6 hours or longer Continue weight loss journey (goal 225lb)  Orders: Pulmonary function testing re: chest tightness/DOE (please order) Overnight pulse oximetry on cpap (please order)  Referral: ENT re: OSA/upper airway assessment for possible surgical options  (please order) Healthy weight wellness re: BMI 47 (please order)  Follow-up: 4-8 weeks with 1 hour PFT prior with new MD for OSA/dyspnea (Dr. Halford Chessman has openings June 23rd or July 3rd- 30 mins visit)

## 2021-10-01 ENCOUNTER — Telehealth: Payer: Self-pay | Admitting: Family

## 2021-10-01 NOTE — Telephone Encounter (Signed)
Patient would like to know if she can start new supplements- brand not mentioned (wants to talk to nurse) - Deja calling patient now 10/01/21 at 242pm to follow up per patients requests.    Patient is also requesting lab work for abn labs- stated she was put on a new medication and it made her labs abnormal- would like to repeat labs.

## 2021-10-01 NOTE — Telephone Encounter (Signed)
Her last labs were normal, do not need to be rechecked again, she is no longer taking the HCTZ.

## 2021-10-02 ENCOUNTER — Ambulatory Visit: Payer: 59 | Admitting: Family

## 2021-10-02 ENCOUNTER — Encounter: Payer: Self-pay | Admitting: Family

## 2021-10-02 ENCOUNTER — Ambulatory Visit (INDEPENDENT_AMBULATORY_CARE_PROVIDER_SITE_OTHER): Payer: 59 | Admitting: Family

## 2021-10-02 VITALS — BP 106/78 | HR 73 | Temp 98.3°F | Ht 64.0 in | Wt 276.1 lb

## 2021-10-02 DIAGNOSIS — R5382 Chronic fatigue, unspecified: Secondary | ICD-10-CM | POA: Diagnosis not present

## 2021-10-02 NOTE — Patient Instructions (Addendum)
It was very nice to see you today!   As discussed, please consult with your cardiologist regarding your blood pressure medicines, readings, and possible side effects.  Let me know if any other future concerns.  Have a great weekend!     PLEASE NOTE:  If you had any lab tests please let us know if you have not heard back within a few days. You may see your results on MyChart before we have a chance to review them but we will give you a call once they are reviewed by [place]. If we ordered any referrals today, please let us know if you have not heard from their office within the next week.

## 2021-10-02 NOTE — Telephone Encounter (Signed)
Pt has an appointment with Colletta [place] on today 6/2 at [place].

## 2021-10-02 NOTE — Assessment & Plan Note (Signed)
Pt brought in a few supplements, advised pt they look safe to take, Vitamin Bs, & iron, Vit D, CoQ10, & Niacin, w/ appropriate doses. Continues to work on diet, reducing carbs/sugar, exercising most days. Still waiting to see nutritionist. Advised pt to discuss BP meds with Cardiology as may can contribute to sx.

## 2021-10-02 NOTE — Assessment & Plan Note (Signed)
Chronic - wt down 2 lbs, pt changing her diet, exercising more, monitoring her water intake. Advised to continue current plan, watching total carb intake, sugar grams/day, sodium grams/day, gave approx numbers today, but nutritionist should be more helpful.

## 2021-10-02 NOTE — Progress Notes (Signed)
Subjective:     Patient ID: Cassie Duncan, female    DOB: 1985/07/10, 36 y.o.   MRN: [id number]  Chief Complaint  Patient presents with   Hypertension   Follow-up    Pt still c/o Fatigue. Discuss vitamin supplements that are ok to take.     HPI: Obesity: Patient complains of obesity. Patient cites health, increased physical ability, self-image as reasons for wanting to lose weight. Amount of time at present weight: years, but has lost 20lbs over last 80mo. Circumstances associated with regain of weight: stress, poor food choices, no exercise. Successful weight loss techniques attempted: self-directed dieting with strict Keto foods  Exercise: walking, starting weight training.  Eating precipitated by stress? sometimes.Use of medications that may cause weight gain none.  Fatigue:  pt reports sx continue despite changing her diet, reducing sugar/carb intake, and increasing exercise. Still waiting to see nutritionist. Weight down 2 lbs, pt is sleeping better, but feels very tired at end of day. BP readings at home fluctuating, using 3 different machines, but readings are not higher than 1233Asystolic.   Assessment & Plan:   Problem List Items Addressed This Visit       Other   Fatigue - Primary    Pt brought in a few supplements, advised pt they look safe to take, Vitamin Bs, & iron, Vit D, CoQ10, & Niacin, w/ appropriate doses. Continues to work on diet, reducing carbs/sugar, exercising most days. Still waiting to see nutritionist. Advised pt to discuss BP meds with Cardiology as may can contribute to sx.       Morbid obesity ([facility])    Chronic - wt down 2 lbs, pt changing her diet, exercising more, monitoring her water intake. Advised to continue current plan, watching total carb intake, sugar grams/day, sodium grams/day, gave approx numbers today, but nutritionist should be more helpful.        Outpatient Medications Prior to Visit  Medication Sig Dispense Refill   amLODipine  (NORVASC) 2.5 MG tablet Take 2.5 mg by mouth 3 (three) times a week.     ascorbic acid (VITAMIN C) 250 MG tablet Take 1 tablet by mouth daily.     Cholecalciferol 10 MCG (400 UNIT) CAPS Take by mouth.     ELDERBERRY PO Take by mouth.     labetalol (NORMODYNE) 100 MG tablet PLEASE SEE ATTACHED FOR DETAILED DIRECTIONS     Multiple Vitamin (MULTIVITAMIN) capsule Take 1 capsule by mouth daily.       Omega 3-6-9 Fatty Acids (ADULT OMEGA PLUS DHA PO) Take by mouth.     OVER THE COUNTER MEDICATION CTS 360 DIETARY SUPPLEMENT ..Marland Kitchen29 MG.. TAKES TWICE A DAY. TONALIN - DIETARY SUPPLEMENT 2000 MG TWICE ADAY      Mefenamic Acid 250 MG CAPS Take one cap po q6h prn menstrual cramps. 30 each 5   Omega-3 Fatty Acids (FISH OIL) 1000 MG CAPS Take by mouth.     No facility-administered medications prior to visit.    Past Medical History:  Diagnosis Date   Elevated BP without diagnosis of hypertension 02/22/2020   Obesity (BMI 30-39.9) 03/19/2011    Past Surgical History:  Procedure Laterality Date   WISDOM TOOTH EXTRACTION  2005    No Known Allergies     Objective:    Physical Exam Vitals and nursing note reviewed.  Constitutional:      Appearance: Normal appearance. She is obese.  Cardiovascular:     Rate and Rhythm: Normal rate and regular rhythm.  Pulmonary:     Effort: Pulmonary effort is normal.     Breath sounds: Normal breath sounds.  Musculoskeletal:        General: Normal range of motion.  Skin:    General: Skin is warm and dry.  Neurological:     Mental Status: She is alert.  Psychiatric:        Mood and Affect: Mood normal.        Behavior: Behavior normal.    BP 106/78 (BP Location: Left Arm, Patient Position: Sitting, Cuff Size: Large)   Pulse 73   Temp 98.3 F (36.8 C) (Temporal)   Ht '5\' 4"'$  (1.626 m)   Wt 276 lb 2 oz (125.2 kg)   LMP 09/10/2021 (Exact Date)   SpO2 100%   BMI 47.40 kg/m  Wt Readings from Last 3 Encounters:  10/02/21 276 lb 2 oz (125.2 kg)   09/25/21 278 lb 9.6 oz (126.4 kg)  09/18/21 275 lb (124.7 kg)   *Extra time (24mn) spent with patient today which consisted of chart review, discussing diagnoses related to obesity, specific dietary foods, reviewing pt vitamin supplement bottles, work up, answering questions regarding blood pressure, fatigue, and documentation.   HJeanie Sewer NP

## 2021-10-09 ENCOUNTER — Ambulatory Visit (INDEPENDENT_AMBULATORY_CARE_PROVIDER_SITE_OTHER): Payer: 59 | Admitting: Pulmonary Disease

## 2021-10-09 DIAGNOSIS — R0789 Other chest pain: Secondary | ICD-10-CM

## 2021-10-09 DIAGNOSIS — R0609 Other forms of dyspnea: Secondary | ICD-10-CM

## 2021-10-09 LAB — PULMONARY FUNCTION TEST
DL/VA % pred: 115 %
DL/VA: 5.18 ml/min/mmHg/L
DLCO cor % pred: 77 %
DLCO cor: 17.05 ml/min/mmHg
DLCO unc % pred: 77 %
DLCO unc: 17.26 ml/min/mmHg
FEF 25-75 Post: 4.29 L/sec
FEF 25-75 Pre: 2.13 L/sec
FEF2575-%Change-Post: 101 %
FEF2575-%Pred-Post: 143 %
FEF2575-%Pred-Pre: 71 %
FEV1-%Change-Post: 26 %
FEV1-%Pred-Post: 99 %
FEV1-%Pred-Pre: 78 %
FEV1-Post: 2.61 L
FEV1-Pre: 2.06 L
FEV1FVC-%Change-Post: 5 %
FEV1FVC-%Pred-Pre: 96 %
FEV6-%Change-Post: 20 %
FEV6-%Pred-Post: 98 %
FEV6-%Pred-Pre: 81 %
FEV6-Post: 3.04 L
FEV6-Pre: 2.52 L
FEV6FVC-%Pred-Post: 101 %
FEV6FVC-%Pred-Pre: 101 %
FVC-%Change-Post: 20 %
FVC-%Pred-Post: 96 %
FVC-%Pred-Pre: 80 %
FVC-Post: 3.04 L
FVC-Pre: 2.52 L
Post FEV1/FVC ratio: 86 %
Post FEV6/FVC ratio: 100 %
Pre FEV1/FVC ratio: 82 %
Pre FEV6/FVC Ratio: 100 %
RV % pred: 80 %
RV: 1.21 L
TLC % pred: 76 %
TLC: 3.85 L

## 2021-10-09 NOTE — Patient Instructions (Addendum)
Full PFT performed today. °

## 2021-10-09 NOTE — Progress Notes (Signed)
Full PFT performed today. °

## 2021-10-16 DIAGNOSIS — G4733 Obstructive sleep apnea (adult) (pediatric): Secondary | ICD-10-CM | POA: Diagnosis not present

## 2021-10-18 ENCOUNTER — Encounter: Payer: Self-pay | Admitting: Primary Care

## 2021-10-23 ENCOUNTER — Ambulatory Visit (INDEPENDENT_AMBULATORY_CARE_PROVIDER_SITE_OTHER): Payer: 59 | Admitting: Pulmonary Disease

## 2021-10-23 ENCOUNTER — Telehealth: Payer: Self-pay | Admitting: Primary Care

## 2021-10-23 ENCOUNTER — Encounter: Payer: Self-pay | Admitting: Pulmonary Disease

## 2021-10-23 VITALS — BP 122/83 | HR 78 | Temp 98.1°F | Ht 64.0 in | Wt 269.4 lb

## 2021-10-23 DIAGNOSIS — Z8616 Personal history of COVID-19: Secondary | ICD-10-CM | POA: Diagnosis not present

## 2021-10-23 DIAGNOSIS — G4733 Obstructive sleep apnea (adult) (pediatric): Secondary | ICD-10-CM | POA: Diagnosis not present

## 2021-10-23 DIAGNOSIS — E669 Obesity, unspecified: Secondary | ICD-10-CM

## 2021-10-23 DIAGNOSIS — J4520 Mild intermittent asthma, uncomplicated: Secondary | ICD-10-CM | POA: Diagnosis not present

## 2021-10-23 DIAGNOSIS — G4730 Sleep apnea, unspecified: Secondary | ICD-10-CM

## 2021-10-23 MED ORDER — ALBUTEROL SULFATE HFA 108 (90 BASE) MCG/ACT IN AERS: 2.0000 | INHALATION_SPRAY | Freq: Four times a day (QID) | RESPIRATORY_TRACT | 6 refills | Status: AC | PRN

## 2021-10-23 NOTE — Telephone Encounter (Signed)
Called and spoke with patient, provided results/recommendations per Ames Dura NP.  She verbalized understanding.  Nothing further needed.

## 2021-11-02 ENCOUNTER — Ambulatory Visit: Payer: 59 | Admitting: Internal Medicine

## 2021-11-15 DIAGNOSIS — G4733 Obstructive sleep apnea (adult) (pediatric): Secondary | ICD-10-CM | POA: Diagnosis not present

## 2021-11-17 ENCOUNTER — Ambulatory Visit: Payer: 59 | Admitting: Registered"

## 2021-12-10 NOTE — Assessment & Plan Note (Addendum)
-   Dx with hypertension in April 2023, following with cardiology. Started on Labetalol '50mg'$  BID and Amlodipine 2.'5mg'$  daily

## 2021-12-10 NOTE — Assessment & Plan Note (Signed)
-   Refer to medical weight management

## 2021-12-10 NOTE — Assessment & Plan Note (Addendum)
-   Sleep study 06/18/20 showed moderate OSA, AHI 21.4/hr with SpO2 low 87%. Started on CPAP in January 2023. She is 40% compliant with CPAP use last 90 days, average usage 4 hours 43 mins. Pressure 5-15cm h20 (8.1cm h20-95%); Residual AHI 0.6. No pressure changes needed. Encourage patient continue aim to wear CPAP every night 4-6 hours or longer. She is actively working on weight loss. Checking ONO on CPAP. Referring to ENT for upper airway assessment for possible surgical options for OSA.

## 2021-12-10 NOTE — Assessment & Plan Note (Signed)
-   Patient has symptoms of morning chest tightness and shallow breathing/dyspnea on exertion. Recommend getting pulmonary function testing and ONO on CPAP.

## 2021-12-16 DIAGNOSIS — G4733 Obstructive sleep apnea (adult) (pediatric): Secondary | ICD-10-CM | POA: Diagnosis not present

## 2022-01-16 DIAGNOSIS — G4733 Obstructive sleep apnea (adult) (pediatric): Secondary | ICD-10-CM | POA: Diagnosis not present

## 2022-01-18 ENCOUNTER — Encounter (INDEPENDENT_AMBULATORY_CARE_PROVIDER_SITE_OTHER): Payer: Self-pay | Admitting: Internal Medicine

## 2022-01-18 ENCOUNTER — Ambulatory Visit (INDEPENDENT_AMBULATORY_CARE_PROVIDER_SITE_OTHER): Payer: 59 | Admitting: Internal Medicine

## 2022-01-18 VITALS — BP 119/81 | HR 76 | Temp 98.2°F | Ht 64.0 in | Wt 276.0 lb

## 2022-01-18 DIAGNOSIS — Z0289 Encounter for other administrative examinations: Secondary | ICD-10-CM

## 2022-01-18 DIAGNOSIS — I10 Essential (primary) hypertension: Secondary | ICD-10-CM

## 2022-01-18 DIAGNOSIS — Z6841 Body Mass Index (BMI) 40.0 and over, adult: Secondary | ICD-10-CM

## 2022-01-18 DIAGNOSIS — R5383 Other fatigue: Secondary | ICD-10-CM

## 2022-01-18 DIAGNOSIS — R5382 Chronic fatigue, unspecified: Secondary | ICD-10-CM

## 2022-01-18 NOTE — Progress Notes (Unsigned)
  Office: [phone]  /  Fax: [phone]  New Patient Consultation  Cassie Duncan was seen in clinic today to evaluate for obesity. We did a consultation to discuss her options for treatment and educate the patient on her disease state. She is interested in losing weight to improve overall health, reduce risk of complications and have a safe pregn2ancy in the future.   Cassie Duncan's evaluation and workup began today. She was weighed on the bioimpedance scale and results were discussed and documented in the synopsis. Blood pressure 119/81, pulse 76, temperature 98.2 F (36.8 C), height '5\' 4"'$  (1.626 m), weight 276 lb (125.2 kg), SpO2 100 %. Body mass index is 47.38 kg/m.   Obesity education preformed today:  We discussed obesity as a disease and the importance of a more detailed evaluation of all the factors contributing to the disease.  We discussed the importance of long term lifestyle changes which include nutrition, exercise and behavioral modifications as well as the importance of customizing this to her specific health and social needs.  We discussed the benefits of reaching a healthier weight to alleviate the symptoms or reduce the risks of biomechanical, metabolic and psychological effects of obesity.  Current health conditions we discussed, and we expect to improve include:   Class 3 severe obesity with serious comorbidity and body mass index (BMI) of 45.0 to 49.9 in adult, unspecified obesity type ([place])  Essential hypertension  Chronic fatigue.  We discussed the goals of this program is to improve her overall health and not simply achieve a specific BMI.  Frequent visits are very important to patient success. I plan to see her every 2 weeks for the first 3 months and then evaluate the visit frequency after that time. I explained obesity is a life-long chronic disease and long term treatments would be required. Medications to help her follow his eating plan may be  offered as appropriate but are not required. All medication decisions will be made together after the initial workup is done and benefits and side effects are discussed in depth.  The clinic rules were reviewed including the late policy, cancellation policy, no show and program fees.  Cassie Duncan appears to be in the action stage of change and agrees they are ready to start intensive lifestyle modifications and behavioral modifications. We will schedule a longer visit to continue the evaluation including fasting labs, indirect calorimetry, ECG and a full nutritional and lifestyle evaluation.  25 minutes was spent today on this visit including the above counseling, pre-visit chart review, and post-visit documentation.

## 2022-01-25 ENCOUNTER — Encounter: Payer: Self-pay | Admitting: *Deleted

## 2022-02-02 DIAGNOSIS — G4733 Obstructive sleep apnea (adult) (pediatric): Secondary | ICD-10-CM | POA: Diagnosis not present

## 2022-02-04 ENCOUNTER — Encounter (INDEPENDENT_AMBULATORY_CARE_PROVIDER_SITE_OTHER): Payer: Self-pay | Admitting: Internal Medicine

## 2022-02-04 ENCOUNTER — Ambulatory Visit (INDEPENDENT_AMBULATORY_CARE_PROVIDER_SITE_OTHER): Payer: 59 | Admitting: Internal Medicine

## 2022-02-04 VITALS — BP 134/87 | HR 71 | Temp 98.5°F | Ht 64.0 in | Wt 274.2 lb

## 2022-02-04 DIAGNOSIS — R5383 Other fatigue: Secondary | ICD-10-CM

## 2022-02-04 DIAGNOSIS — G4733 Obstructive sleep apnea (adult) (pediatric): Secondary | ICD-10-CM | POA: Insufficient documentation

## 2022-02-04 DIAGNOSIS — R0602 Shortness of breath: Secondary | ICD-10-CM | POA: Insufficient documentation

## 2022-02-04 DIAGNOSIS — I10 Essential (primary) hypertension: Secondary | ICD-10-CM | POA: Diagnosis not present

## 2022-02-04 DIAGNOSIS — Z6841 Body Mass Index (BMI) 40.0 and over, adult: Secondary | ICD-10-CM

## 2022-02-04 DIAGNOSIS — E7849 Other hyperlipidemia: Secondary | ICD-10-CM | POA: Diagnosis not present

## 2022-02-04 DIAGNOSIS — Z1331 Encounter for screening for depression: Secondary | ICD-10-CM

## 2022-02-04 DIAGNOSIS — Z5181 Encounter for therapeutic drug level monitoring: Secondary | ICD-10-CM

## 2022-02-04 DIAGNOSIS — E66813 Obesity, class 3: Secondary | ICD-10-CM | POA: Insufficient documentation

## 2022-02-04 HISTORY — DX: Encounter for therapeutic drug level monitoring: Z51.81

## 2022-02-04 HISTORY — DX: Essential (primary) hypertension: I10

## 2022-02-05 LAB — LIPID PANEL WITH LDL/HDL RATIO
Cholesterol, Total: 161 mg/dL (ref 100–199)
HDL: 47 mg/dL (ref >39)
LDL Chol Calc (NIH): 106 mg/dL — ABNORMAL HIGH (ref 0–99)
LDL/HDL Ratio: 2.3 ratio (ref 0.0–3.2)
Triglycerides: 33 mg/dL (ref 0–149)
VLDL Cholesterol Cal: 8 mg/dL (ref 5–40)

## 2022-02-05 LAB — INSULIN, RANDOM: INSULIN: 5.3 u[IU]/mL (ref 2.6–24.9)

## 2022-02-09 NOTE — Progress Notes (Unsigned)
Chief Complaint:   OBESITY Cassie Duncan (MR# [id number]) is a 36 y.o. female who presents for evaluation and treatment of obesity and related comorbidities. Current BMI is Body mass index is 47.07 kg/m. Sharran has been struggling with her weight for many years and has been unsuccessful in either losing weight, maintaining weight loss, or reaching her healthy weight goal.  Andrina is currently in the action stage of change and ready to dedicate time achieving and maintaining a healthier weight. Anysa is interested in becoming our patient and working on intensive lifestyle modifications including (but not limited to) diet and exercise for weight loss.  Adyn is down 2 pounds since 01/18/2022.  She is doing cardio (walking) and strength training for 30 minutes 2-5 times per week.  Zanayah's habits were reviewed today and are as follows: her desired weight loss is 124 lbs, she has been heavy most of her life, she started gaining weight during freshman year of college after her mother died, her heaviest weight ever was 312 pounds, she is a picky eater and doesn't like to eat healthier foods, she has significant food cravings issues, she skips meals frequently, she is trying to follow a vegetarian diet, she frequently makes poor food choices, she has problems with excessive hunger, she frequently eats larger portions than normal, and she struggles with emotional eating.  Depression Screen [place]'s Food and Mood (modified PHQ-9) score was 13.     02/04/2022    7:53 AM  Depression screen PHQ 2/9  Decreased Interest 2  Down, Depressed, Hopeless 1  PHQ - 2 Score 3  Altered sleeping 3  Tired, decreased energy 3  Change in appetite 2  Feeling bad or failure about yourself  2  Trouble concentrating 0  Moving slowly or fidgety/restless 0  Suicidal thoughts 0  PHQ-9 Score 13  Difficult doing work/chores Not difficult at all   Subjective:   1. Other fatigue Brent admits to daytime  somnolence and admits to waking up still tired. Patient has a history of symptoms of daytime fatigue and morning fatigue. Abbey generally gets 6 hours of sleep per night, and states that she has nightime awakenings. Snoring is present. Apneic episodes are present. Epworth Sleepiness Score is 7.  EKG normal sinus rhythm without acute ischemia.   2. SOB (shortness of breath) Carlon notes increasing shortness of breath with exercising and seems to be worsening over time with weight gain. She notes getting out of breath sooner with activity than she used to. This has not gotten worse recently. Lore denies shortness of breath at rest or orthopnea.  3. Other hyperlipidemia Mayce's recent LDL was 120 in May 2023, and she is asymptomatic.  I discussed labs with the patient today.  4. Obstructive sleep apnea Neesha has moderate obstructive sleep apnea.  She is on CPAP, and her Epworth score is 7.  5. Essential hypertension Nicoya's blood pressure is slightly above target.  She is on beta-blocker, CCB as per her PCP.  Monitors for weight gain on beta-blockers.  Assessment/Plan:   1. Other fatigue Yannis does feel that her weight is causing her energy to be lower than it should be. Fatigue may be related to obesity, depression or many other causes. Labs will be ordered, and in the meanwhile, Merrissa will focus on self care including making healthy food choices, increasing physical activity and focusing on stress reduction.  - Vitamin B12 - CBC with Differential/Platelet; Future - Comprehensive metabolic panel; Future - Hemoglobin A1c;  Future - Insulin, random - Lipid Panel With LDL/HDL Ratio - VITAMIN D 25 Hydroxy (Vit-D Deficiency, Fractures); Future  2. SOB (shortness of breath) Daylan does feel that she gets out of breath more easily that she used to when she exercises. Basma's shortness of breath appears to be obesity related and exercise induced. She has agreed to work on weight  loss and gradually increase exercise to treat her exercise induced shortness of breath. Will continue to monitor closely.  3. Other hyperlipidemia We will check labs today.  We will unable to assess her ASCVD risk score due to her age.  4. Obstructive sleep apnea Cayli will work on her weight loss therapy with a goal of 15% total weight loss will improve AHI.  She will continue her CPAP.  5. Essential hypertension Savayah will continue her blood pressure medications, and start her weight loss therapy.  We will check renal parameters. (See labs).   6. Depression screening Maison had a positive depression screening. Depression is commonly associated with obesity and often results in emotional eating behaviors. We will monitor this closely and work on CBT to help improve the non-hunger eating patterns. Referral to Psychology may be required if no improvement is seen as she continues in our clinic.  7. Class 3 severe obesity without serious comorbidity with body mass index (BMI) of 45.0 to 49.9 in adult, unspecified obesity type (HCC) Leva is currently in the action stage of change and her goal is to continue with weight loss efforts. I recommend Jillianne begin the structured treatment plan as follows:  She has agreed to the [employer], open to chicken, eggs, dairy.  Exercise goals: As is.  Behavioral modification strategies: increasing lean protein intake, decreasing simple carbohydrates, increasing water intake, no skipping meals, meal planning and cooking strategies, and planning for success.  She was informed of the importance of frequent follow-up visits to maximize her success with intensive lifestyle modifications for her multiple health conditions. She was informed we would discuss her lab results at her next visit unless there is a critical issue that needs to be addressed sooner. Petrina agreed to keep her next visit at the agreed upon time to discuss these  results.  Objective:   Blood pressure 134/87, pulse 71, temperature 98.5 F (36.9 C), height '5\' 4"'$  (1.626 m), weight 274 lb 3.2 oz (124.4 kg), SpO2 100 %. Body mass index is 47.07 kg/m.  EKG: Normal sinus rhythm, rate 76 BPM.  Indirect Calorimeter completed today shows a VO2 of 263 and a REE of 1814.  Her calculated basal metabolic rate is 4196 thus her basal metabolic rate is worse than expected.  General: Cooperative, alert, well developed, in no acute distress. HEENT: Conjunctivae and lids unremarkable. Cardiovascular: Regular rhythm.  Lungs: Normal work of breathing. Neurologic: No focal deficits.   Lab Results  Component Value Date   CREATININE 0.90 09/18/2021   BUN 6 09/18/2021   NA 137 09/18/2021   K 3.9 09/18/2021   CL 101 09/18/2021   CO2 29 09/18/2021   Lab Results  Component Value Date   ALT 20 09/02/2021   AST 22 09/02/2021   ALKPHOS 49 09/02/2021   BILITOT 0.4 09/02/2021   Lab Results  Component Value Date   HGBA1C 5.8 (H) 02/23/2012   Lab Results  Component Value Date   INSULIN 5.3 02/04/2022   Lab Results  Component Value Date   TSH 1.44 09/02/2021   Lab Results  Component Value Date   CHOL 161  02/04/2022   HDL 47 02/04/2022   LDLCALC 106 (H) 02/04/2022   TRIG 33 02/04/2022   CHOLHDL 4 09/02/2021   Lab Results  Component Value Date   WBC 6.7 09/02/2021   HGB 13.8 09/02/2021   HCT 41.9 09/02/2021   MCV 87.7 09/02/2021   PLT 403.0 (H) 09/02/2021   No results found for: "IRON", "TIBC", "FERRITIN"  Attestation Statements:   Reviewed by clinician on day of visit: allergies, medications, problem list, medical history, surgical history, family history, social history, and previous encounter notes.  Time spent on visit including pre-visit chart review and post-visit charting and care was 40 minutes.   Wilhemena Durie, am acting as transcriptionist for Thomes Dinning, MD.  I have reviewed the above documentation for accuracy and  completeness, and I agree with the above. -Thomes Dinning, MD

## 2022-02-15 DIAGNOSIS — G4733 Obstructive sleep apnea (adult) (pediatric): Secondary | ICD-10-CM | POA: Diagnosis not present

## 2022-02-18 ENCOUNTER — Ambulatory Visit (INDEPENDENT_AMBULATORY_CARE_PROVIDER_SITE_OTHER): Payer: 59 | Admitting: Internal Medicine

## 2022-02-18 ENCOUNTER — Encounter (INDEPENDENT_AMBULATORY_CARE_PROVIDER_SITE_OTHER): Payer: Self-pay | Admitting: Internal Medicine

## 2022-02-18 VITALS — BP 129/83 | HR 62 | Temp 98.8°F | Ht 64.0 in | Wt 268.6 lb

## 2022-02-18 DIAGNOSIS — R002 Palpitations: Secondary | ICD-10-CM

## 2022-02-18 DIAGNOSIS — E669 Obesity, unspecified: Secondary | ICD-10-CM | POA: Diagnosis not present

## 2022-02-18 DIAGNOSIS — Z6841 Body Mass Index (BMI) 40.0 and over, adult: Secondary | ICD-10-CM | POA: Diagnosis not present

## 2022-02-18 DIAGNOSIS — I10 Essential (primary) hypertension: Secondary | ICD-10-CM

## 2022-02-18 DIAGNOSIS — R5383 Other fatigue: Secondary | ICD-10-CM | POA: Diagnosis not present

## 2022-02-23 NOTE — Progress Notes (Signed)
Chief Complaint:   OBESITY Cassie Duncan is here to discuss her progress with her obesity treatment plan along with follow-up of her obesity related diagnoses. [name] is on practicing portion control and making smarter food choices, such as increasing vegetables and decreasing simple carbohydrates and states she is following her eating plan approximately 85% of the time. [name] states she is on the elliptical and weight training for 30 minutes 3-7 times per week.  Today's visit was #: 2 Starting weight: 274 lbs Starting date: 02/04/2022 Today's weight: 268 lbs Today's date: 02/18/2022 Total lbs lost to date: 6 Total lbs lost since last in-office visit: 6  Interim History: [name] has been doing well and has adequate appetite control. May not be hitting protein target as desired. She notes significant increase in physical activity and walking the dog. Inquires about safety of blood pressure medications concerning fertility. She notes improved energy and endurance. She is considering pregnancy in the future.   Subjective:   1. Other fatigue [name]'s fatigue has improved. CMP, CBC, TSH within normal limits. B12 and Vitamin D levels not completed. I discussed labs with the patient today.   2. Hypertension, essential [name]'s blood pressure is close to goal. It should improve with further weight loss. We discussed beta blocker may contribute to weight gain. Reviewed labs, favorable lipid panel and renal parameters. I discussed labs with the patient today.   3. Palpitations [name]'s symptoms have resolved per the patient. She feels this was due to caffeine excess in the past. Currently on labetolol, and last TSH within normal limits.   Assessment/Plan:   1. Other fatigue We will recheck Vitamin D and B12 at her next visit. She will continue with her weight loss therapy.   2. Hypertension, essential [name] will continue with her weight loss therapy. She will discuss with Cardiology if  still needed. She feels palpitations were due to reversible causes (caffeine excess).   3. Palpitations [name] will discussed prescription with her physician, and possibly consider switching to a weight neutral agent if no chronotropic effect is needed.  Patient is also concerned about other blood pressure medications affecting her reproductive future.  Advised to forward concerns to reproductive physician.  4. Current BMI 46.1 [name] is currently in the action stage of change. As such, her goal is to continue with weight loss efforts. She has agreed to the [facility] with 90 grams of protein.   Provided with protein sources and options for breakfast.   Exercise goals: As is.   Behavioral modification strategies: increasing lean protein intake, increasing water intake, no skipping meals, meal planning and cooking strategies, avoiding temptations, and planning for success.  [name] has agreed to follow-up with our clinic in 2 weeks. She was informed of the importance of frequent follow-up visits to maximize her success with intensive lifestyle modifications for her multiple health conditions.   Objective:   Blood pressure 129/83, pulse 62, temperature 98.8 F (37.1 C), height '5\' 4"'$  (1.626 m), weight 268 lb 9.6 oz (121.8 kg), SpO2 97 %. Body mass index is 46.11 kg/m.  General: Cooperative, alert, well developed, in no acute distress. HEENT: Conjunctivae and lids unremarkable. Cardiovascular: Regular rhythm.  Lungs: Normal work of breathing. Neurologic: No focal deficits.   Lab Results  Component Value Date   CREATININE 0.90 09/18/2021   BUN 6 09/18/2021   NA 137 09/18/2021   K 3.9 09/18/2021   CL 101 09/18/2021   CO2 29 09/18/2021   Lab Results  Component  Value Date   ALT 20 09/02/2021   AST 22 09/02/2021   ALKPHOS 49 09/02/2021   BILITOT 0.4 09/02/2021   Lab Results  Component Value Date   HGBA1C 5.8 (H) 02/23/2012   Lab Results  Component Value Date    INSULIN 5.3 02/04/2022   Lab Results  Component Value Date   TSH 1.44 09/02/2021   Lab Results  Component Value Date   CHOL 161 02/04/2022   HDL 47 02/04/2022   LDLCALC 106 (H) 02/04/2022   TRIG 33 02/04/2022   CHOLHDL 4 09/02/2021   No results found for: "VD25OH" Lab Results  Component Value Date   WBC 6.7 09/02/2021   HGB 13.8 09/02/2021   HCT 41.9 09/02/2021   MCV 87.7 09/02/2021   PLT 403.0 (H) 09/02/2021   No results found for: "IRON", "TIBC", "FERRITIN"  Attestation Statements:   Reviewed by clinician on day of visit: allergies, medications, problem list, medical history, surgical history, family history, social history, and previous encounter notes.  Time spent on visit including pre-visit chart review and post-visit care and charting was 40 minutes.   Wilhemena Durie, am acting as transcriptionist for Thomes Dinning, MD.  I have reviewed the above documentation for accuracy and completeness, and I agree with the above. -Thomes Dinning, MD

## 2022-03-02 ENCOUNTER — Ambulatory Visit (INDEPENDENT_AMBULATORY_CARE_PROVIDER_SITE_OTHER): Payer: 59 | Admitting: Internal Medicine

## 2022-03-04 ENCOUNTER — Ambulatory Visit (INDEPENDENT_AMBULATORY_CARE_PROVIDER_SITE_OTHER): Payer: 59 | Admitting: Internal Medicine

## 2022-03-04 ENCOUNTER — Encounter (INDEPENDENT_AMBULATORY_CARE_PROVIDER_SITE_OTHER): Payer: Self-pay | Admitting: Internal Medicine

## 2022-03-04 VITALS — BP 136/86 | HR 74 | Temp 98.4°F | Ht 64.0 in | Wt 269.0 lb

## 2022-03-04 DIAGNOSIS — R7303 Prediabetes: Secondary | ICD-10-CM | POA: Diagnosis not present

## 2022-03-04 DIAGNOSIS — R638 Other symptoms and signs concerning food and fluid intake: Secondary | ICD-10-CM | POA: Insufficient documentation

## 2022-03-04 DIAGNOSIS — G4733 Obstructive sleep apnea (adult) (pediatric): Secondary | ICD-10-CM

## 2022-03-04 DIAGNOSIS — Z6841 Body Mass Index (BMI) 40.0 and over, adult: Secondary | ICD-10-CM | POA: Diagnosis not present

## 2022-03-04 DIAGNOSIS — E669 Obesity, unspecified: Secondary | ICD-10-CM

## 2022-03-04 DIAGNOSIS — I10 Essential (primary) hypertension: Secondary | ICD-10-CM | POA: Diagnosis not present

## 2022-03-05 LAB — CMP14+EGFR
ALT: 10 IU/L (ref 0–32)
AST: 16 IU/L (ref 0–40)
Albumin/Globulin Ratio: 1.1 — ABNORMAL LOW (ref 1.2–2.2)
Albumin: 3.9 g/dL (ref 3.9–4.9)
Alkaline Phosphatase: 65 IU/L (ref 44–121)
BUN/Creatinine Ratio: 12 (ref 9–23)
BUN: 9 mg/dL (ref 6–20)
Bilirubin Total: 0.3 mg/dL (ref 0.0–1.2)
CO2: 23 mmol/L (ref 20–29)
Calcium: 9.5 mg/dL (ref 8.7–10.2)
Chloride: 102 mmol/L (ref 96–106)
Creatinine, Ser: 0.73 mg/dL (ref 0.57–1.00)
Globulin, Total: 3.4 g/dL (ref 1.5–4.5)
Glucose: 84 mg/dL (ref 70–99)
Potassium: 4.6 mmol/L (ref 3.5–5.2)
Sodium: 138 mmol/L (ref 134–144)
Total Protein: 7.3 g/dL (ref 6.0–8.5)
eGFR: 109 mL/min/{1.73_m2} (ref >59)

## 2022-03-05 LAB — CBC WITH DIFFERENTIAL
Basophils Absolute: 0 10*3/uL (ref 0.0–0.2)
Basos: 0 %
EOS (ABSOLUTE): 0 10*3/uL (ref 0.0–0.4)
Eos: 1 %
Hematocrit: 35 % (ref 34.0–46.6)
Hemoglobin: 11.7 g/dL (ref 11.1–15.9)
Immature Grans (Abs): 0 10*3/uL (ref 0.0–0.1)
Immature Granulocytes: 0 %
Lymphocytes Absolute: 2.2 10*3/uL (ref 0.7–3.1)
Lymphs: 41 %
MCH: 29 pg (ref 26.6–33.0)
MCHC: 33.4 g/dL (ref 31.5–35.7)
MCV: 87 fL (ref 79–97)
Monocytes Absolute: 0.3 10*3/uL (ref 0.1–0.9)
Monocytes: 6 %
Neutrophils Absolute: 2.7 10*3/uL (ref 1.4–7.0)
Neutrophils: 52 %
RBC: 4.03 x10E6/uL (ref 3.77–5.28)
RDW: 12.4 % (ref 11.7–15.4)
WBC: 5.3 10*3/uL (ref 3.4–10.8)

## 2022-03-05 LAB — HEMOGLOBIN A1C
Est. average glucose Bld gHb Est-mCnc: 108 mg/dL
Hgb A1c MFr Bld: 5.4 % (ref 4.8–5.6)

## 2022-03-05 LAB — VITAMIN D 25 HYDROXY (VIT D DEFICIENCY, FRACTURES): Vit D, 25-Hydroxy: 47.3 ng/mL (ref 30.0–100.0)

## 2022-03-16 NOTE — Progress Notes (Unsigned)
Chief Complaint:   OBESITY Cassie Duncan is here to discuss her progress with her obesity treatment plan along with follow-up of her obesity related diagnoses. Holley is on the [employer] and states she is following her eating plan approximately 80-90% of the time. Lilana states she is on the elliptical and walking for 30 minutes 7 times per week.  Today's visit was #: 3 Starting weight: 274 lbs Starting date: 02/04/2022 Today's weight: 269 lbs Today's date: 03/04/2022 Total lbs lost to date: 5 Total lbs lost since last in-office visit: 0  Interim History: [name] has noticed increased menstrual flow and she will be seeing her Gynecologist. She had a few lapses the last two weeks and she is getting frustrated with her inability to reach her goal of 250 lbs. She notes problems with cravings and increased appetite. She acknowledges over-indulging at times. She is trying to get more protein using [place] yogurt and protein powder.   Subjective:   1. Hypertension, essential Labrittany's blood pressure has improved. She has not resumed medications, but she will this week. She is on labetalol and amlodipine.   2. Obstructive sleep apnea Abbagayle reports good compliance with her CPAP therapy.   3. Abnormal craving We need to ensure getting prescribed amount of protein 80-90 grams. If she is meeting the requirements then will consider pharmacotherapy.   4. Prediabetes [name]'s last A1c was 5.8 with normal limit insulin level. She is asymptomatic. I discussed labs with the patient today.   Assessment/Plan:   1. Hypertension, essential We will check labs today. Reviewed blood pressure goal and risks with the patient. Daiya was encouraged to take her blood pressure medications and monitor for orthostasis while losing weight.   - CMP14+EGFR - CBC With Differential  2. Obstructive sleep apnea Aitanna will continue with her CPAP compliance and weight loss therapy.   3. Abnormal  craving Sharni is to increase her protein intake, and consider pharmacotherapy at her next visit. Possibly bupropion or topiramate.   4. Prediabetes We will check labs today. [name] will continue her weight loss therapy, and she will continue her current level of physical activity.   - Hemoglobin A1c  5. Current BMI 46.2 [name] is currently in the action stage of change. As such, her goal is to continue with weight loss efforts. She has agreed to the [employer].   We will check labs today.   - VITAMIN D 25 Hydroxy (Vit-D Deficiency, Fractures)  Exercise goals: As is.  Behavioral modification strategies: increasing lean protein intake, avoiding temptations, planning for success, and keeping a strict food journal.  [name] has agreed to follow-up with our clinic in 2 weeks. She was informed of the importance of frequent follow-up visits to maximize her success with intensive lifestyle modifications for her multiple health conditions.   [name] was informed we would discuss her lab results at her next visit unless there is a critical issue that needs to be addressed sooner. Tanya agreed to keep her next visit at the agreed upon time to discuss these results.  Objective:   Blood pressure 136/86, pulse 74, temperature 98.4 F (36.9 C), height _0  (1.626 m), weight 269 lb (122 kg), SpO2 100 %. Body mass index is 46.17 kg/m.  General: Cooperative, alert, well developed, in no acute distress. HEENT: Conjunctivae and lids unremarkable. Cardiovascular: Regular rhythm.  Lungs: Normal work of breathing. Neurologic: No focal deficits.   Lab Results  Component Value Date   CREATININE 0.73 03/04/2022  BUN 9 03/04/2022   NA 138 03/04/2022   K 4.6 03/04/2022   CL 102 03/04/2022   CO2 23 03/04/2022   Lab Results  Component Value Date   ALT 10 03/04/2022   AST 16 03/04/2022   ALKPHOS 65 03/04/2022   BILITOT 0.3 03/04/2022   Lab Results  Component Value Date   HGBA1C  5.4 03/04/2022   HGBA1C 5.8 (H) 02/23/2012   Lab Results  Component Value Date   INSULIN 5.3 02/04/2022   Lab Results  Component Value Date   TSH 1.44 09/02/2021   Lab Results  Component Value Date   CHOL 161 02/04/2022   HDL 47 02/04/2022   LDLCALC 106 (H) 02/04/2022   TRIG 33 02/04/2022   CHOLHDL 4 09/02/2021   Lab Results  Component Value Date   VD25OH 47.3 03/04/2022   Lab Results  Component Value Date   WBC 5.3 03/04/2022   HGB 11.7 03/04/2022   HCT 35.0 03/04/2022   MCV 87 03/04/2022   PLT 403.0 (H) 09/02/2021   No results found for: "IRON", "TIBC", "FERRITIN"  Attestation Statements:   Reviewed by clinician on day of visit: allergies, medications, problem list, medical history, surgical history, family history, social history, and previous encounter notes.  Time spent on visit including pre-visit chart review and post-visit care and charting was 20 minutes.   I, Trixie Dredge, am acting as transcriptionist for Thomes Dinning, MD.  I have reviewed the above documentation for accuracy and completeness, and I agree with the above. -  ***

## 2022-03-18 ENCOUNTER — Ambulatory Visit (INDEPENDENT_AMBULATORY_CARE_PROVIDER_SITE_OTHER): Payer: 59 | Admitting: Internal Medicine

## 2022-03-18 DIAGNOSIS — G4733 Obstructive sleep apnea (adult) (pediatric): Secondary | ICD-10-CM | POA: Diagnosis not present

## 2022-03-23 DIAGNOSIS — Z113 Encounter for screening for infections with a predominantly sexual mode of transmission: Secondary | ICD-10-CM | POA: Diagnosis not present

## 2022-03-23 DIAGNOSIS — N920 Excessive and frequent menstruation with regular cycle: Secondary | ICD-10-CM | POA: Diagnosis not present

## 2022-03-23 DIAGNOSIS — Z1389 Encounter for screening for other disorder: Secondary | ICD-10-CM | POA: Diagnosis not present

## 2022-03-23 DIAGNOSIS — Z713 Dietary counseling and surveillance: Secondary | ICD-10-CM | POA: Diagnosis not present

## 2022-03-23 DIAGNOSIS — R69 Illness, unspecified: Secondary | ICD-10-CM | POA: Diagnosis not present

## 2022-03-23 DIAGNOSIS — Z01411 Encounter for gynecological examination (general) (routine) with abnormal findings: Secondary | ICD-10-CM | POA: Diagnosis not present

## 2022-03-23 DIAGNOSIS — Z130 Encounter for screening for diseases of the blood and blood-forming organs and certain disorders involving the immune mechanism: Secondary | ICD-10-CM | POA: Diagnosis not present

## 2022-04-01 ENCOUNTER — Ambulatory Visit (INDEPENDENT_AMBULATORY_CARE_PROVIDER_SITE_OTHER): Payer: 59 | Admitting: Internal Medicine

## 2022-04-15 ENCOUNTER — Encounter: Payer: Self-pay | Admitting: *Deleted

## 2022-04-17 DIAGNOSIS — G4733 Obstructive sleep apnea (adult) (pediatric): Secondary | ICD-10-CM | POA: Diagnosis not present

## 2022-05-06 ENCOUNTER — Ambulatory Visit (INDEPENDENT_AMBULATORY_CARE_PROVIDER_SITE_OTHER): Payer: BC Managed Care – PPO | Admitting: Internal Medicine

## 2022-05-06 ENCOUNTER — Encounter (INDEPENDENT_AMBULATORY_CARE_PROVIDER_SITE_OTHER): Payer: Self-pay | Admitting: Internal Medicine

## 2022-05-06 VITALS — BP 135/84 | HR 94 | Temp 98.1°F | Ht 64.0 in | Wt 273.0 lb

## 2022-05-06 DIAGNOSIS — I10 Essential (primary) hypertension: Secondary | ICD-10-CM | POA: Diagnosis not present

## 2022-05-06 DIAGNOSIS — E669 Obesity, unspecified: Secondary | ICD-10-CM

## 2022-05-06 DIAGNOSIS — Z6841 Body Mass Index (BMI) 40.0 and over, adult: Secondary | ICD-10-CM | POA: Diagnosis not present

## 2022-05-06 NOTE — Progress Notes (Signed)
Chief Complaint:   OBESITY Cassie Duncan is here to discuss her progress with her obesity treatment plan along with follow-up of her obesity related diagnoses. Cassie Duncan is on the [employer] and states she is following her eating plan approximately 25% of the time. Cassie Duncan states she is doing cardio and weights for 30-90 minutes 7 times per week.  Today's visit was #: 4 Starting weight: 274 lbs Starting date: 02/04/2022 Today's weight: 273 lbs Today's date: 05/06/2022 Total lbs lost to date: 1 Total lbs lost since last in-office visit: 0  Interim History: Cassie Duncan presents today for follow-up.  Since last office visit she has gained 4 pounds.  She reports working out 7 days a week anywhere between 30 to 90 minutes eating mostly seafood and vegetables.  She is journaling food but not keeping track of calories or macronutrient composition necessarily.  She shares with me that she is really frustrated and has negative feelings as she is eager to lose weight and is finding it very difficult.  She feels that her body is fighting her back and that despite her efforts she is not losing weight.  She is working with a Insurance underwriter who had advised her on being patient and I also conveyed to her that this will take some time.  She expressed interest in pharmacotherapy.  Subjective:   1. Hypertension, essential Blood pressure has improved on labetalol and amlodipine.   Assessment/Plan:   1. Hypertension, essential Continue weight loss therapy, and continue current medications and monitor for orthostasis.   2. Obesity, current BMI 47 Today patient was counseled on managing expectations, weight loss process, challenges and coping strategies.  We also discussed benefits and potential risks of pharmacotherapy I conveyed to her that we need to make sure that she is within prescribed calorie target of 1200 to 1300 cal. We could only ensure this if she is tracking her calories accurately or following  a structured plan. I recommended the use of an application.  I also offered to her our low-carb plan and and category 2 meal plan with dairy and seafood as source of protein.  She will study these 2 plans to make a decision over the next few days.  She will work on consistency and avoid changing strategies before an adequate trial.  Cassie Duncan is currently in the action stage of change. As such, her goal is to continue with weight loss efforts. She has agreed to keeping a food journal and adhering to recommended goals of 1200 calories and 90 grams of protein daily.   Undecided on Low carbohydrate plan or Category 2.   Exercise goals: As is.   Behavioral modification strategies: meal planning and cooking strategies and planning for success.  Leaner has agreed to follow-up with our clinic in 3 weeks. She was informed of the importance of frequent follow-up visits to maximize her success with intensive lifestyle modifications for her multiple health conditions.   Objective:   Blood pressure 135/84, pulse 94, temperature 98.1 F (36.7 C), height '5\' 4"'$  (1.626 m), weight 273 lb (123.8 kg), SpO2 100 %. Body mass index is 46.86 kg/m.  General: Cooperative, alert, well developed, in no acute distress. HEENT: Conjunctivae and lids unremarkable. Cardiovascular: Regular rhythm.  Lungs: Normal work of breathing. Neurologic: No focal deficits.   Lab Results  Component Value Date   CREATININE 0.73 03/04/2022   BUN 9 03/04/2022   NA 138 03/04/2022   K 4.6 03/04/2022   CL 102 03/04/2022  CO2 23 03/04/2022   Lab Results  Component Value Date   ALT 10 03/04/2022   AST 16 03/04/2022   ALKPHOS 65 03/04/2022   BILITOT 0.3 03/04/2022   Lab Results  Component Value Date   HGBA1C 5.4 03/04/2022   HGBA1C 5.8 (H) 02/23/2012   Lab Results  Component Value Date   INSULIN 5.3 02/04/2022   Lab Results  Component Value Date   TSH 1.44 09/02/2021   Lab Results  Component Value Date   CHOL 161  02/04/2022   HDL 47 02/04/2022   LDLCALC 106 (H) 02/04/2022   TRIG 33 02/04/2022   CHOLHDL 4 09/02/2021   Lab Results  Component Value Date   VD25OH 47.3 03/04/2022   Lab Results  Component Value Date   WBC 5.3 03/04/2022   HGB 11.7 03/04/2022   HCT 35.0 03/04/2022   MCV 87 03/04/2022   PLT 403.0 (H) 09/02/2021   No results found for: "IRON", "TIBC", "FERRITIN"  Attestation Statements:   Reviewed by clinician on day of visit: allergies, medications, problem list, medical history, surgical history, family history, social history, and previous encounter notes.  Time spent on visit including pre-visit chart review and post-visit care and charting was 30 minutes.   Wilhemena Durie, am acting as transcriptionist for Thomes Dinning, MD.  I have reviewed the above documentation for accuracy and completeness, and I agree with the above. -Thomes Dinning, MD

## 2022-05-14 DIAGNOSIS — N920 Excessive and frequent menstruation with regular cycle: Secondary | ICD-10-CM | POA: Diagnosis not present

## 2022-05-19 IMAGING — US US EXTREM LOW VENOUS
1 series · 13 of 24 positions shown · non-contrast
Comparison: None.

CLINICAL DATA: Leg swelling



[Series 1: us extrem low venous · 13 of 53 slices shown]
[im 1/53]
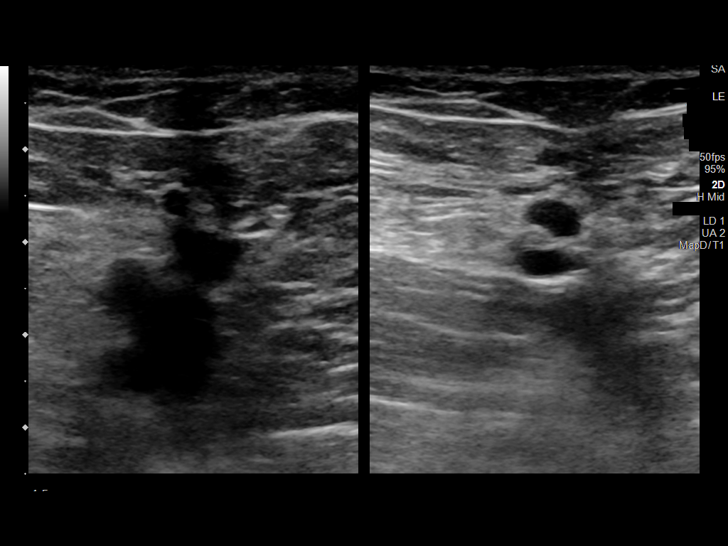
[im 5/53]
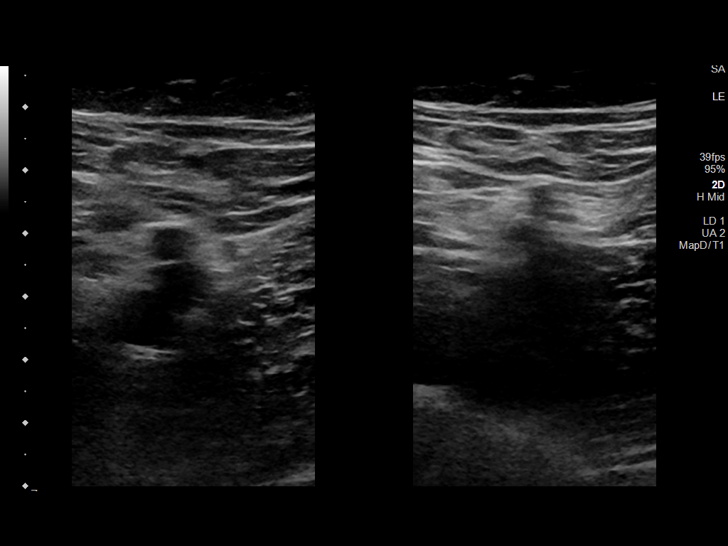
[im 10/53]
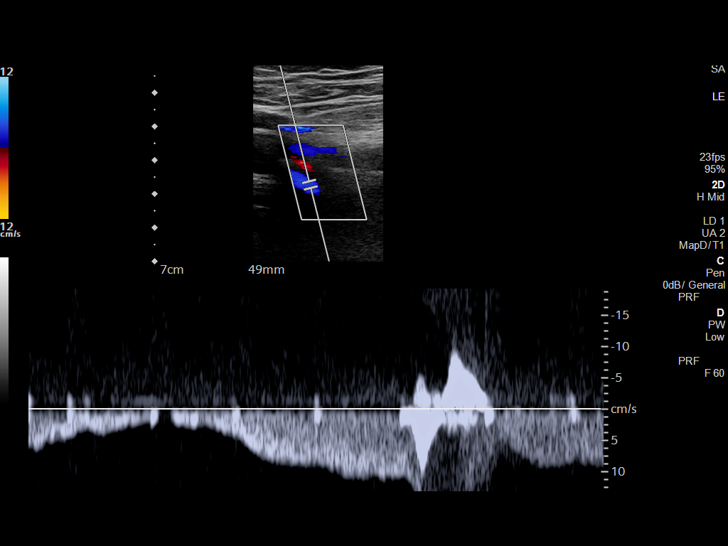
[im 14/53]
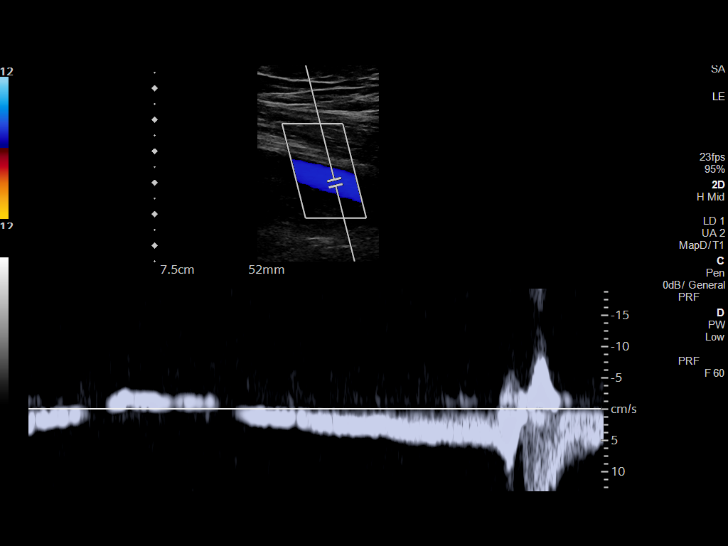
[im 19/53]
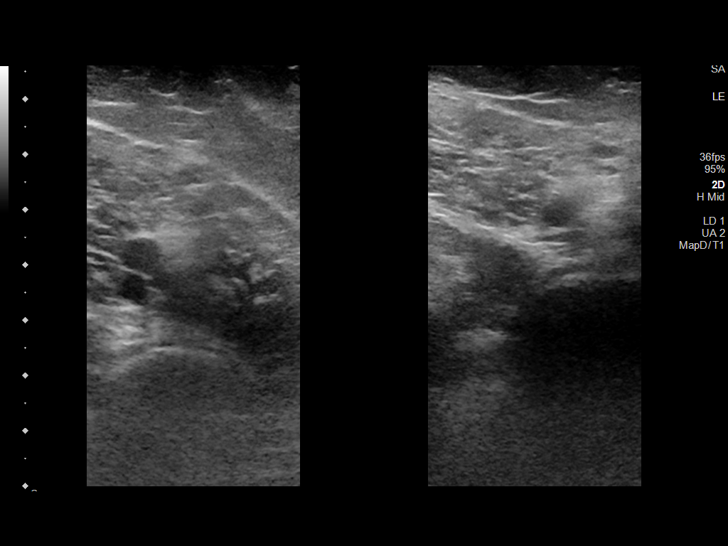
[im 23/53]
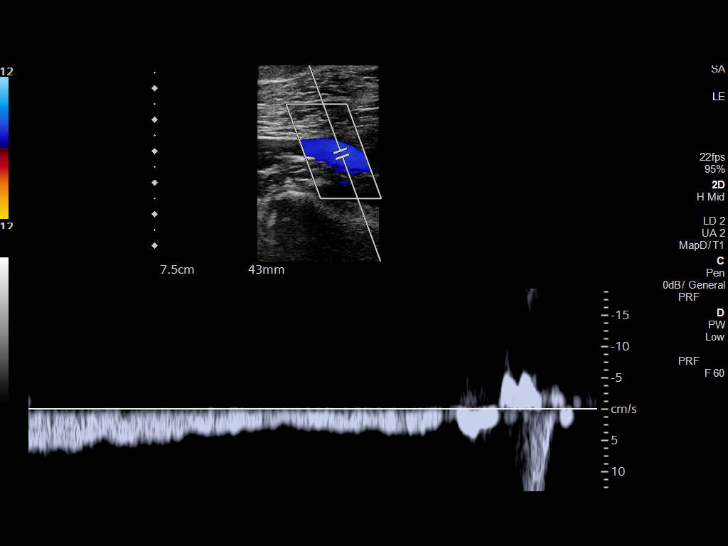
[im 28/53]
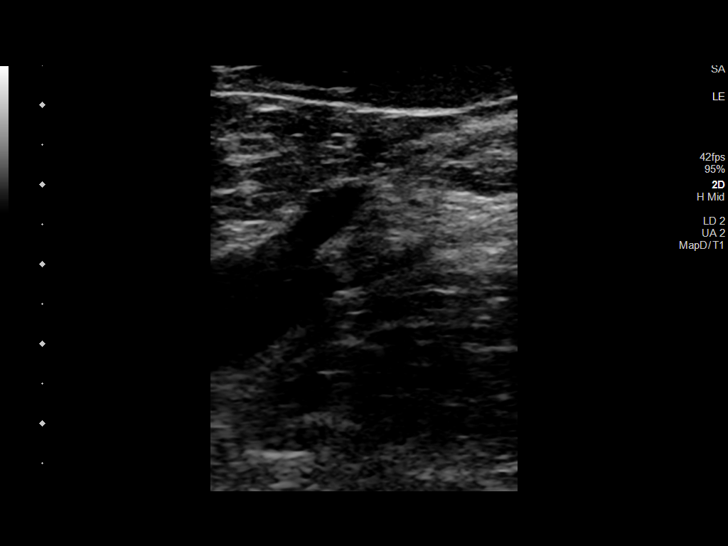
[im 30/53]
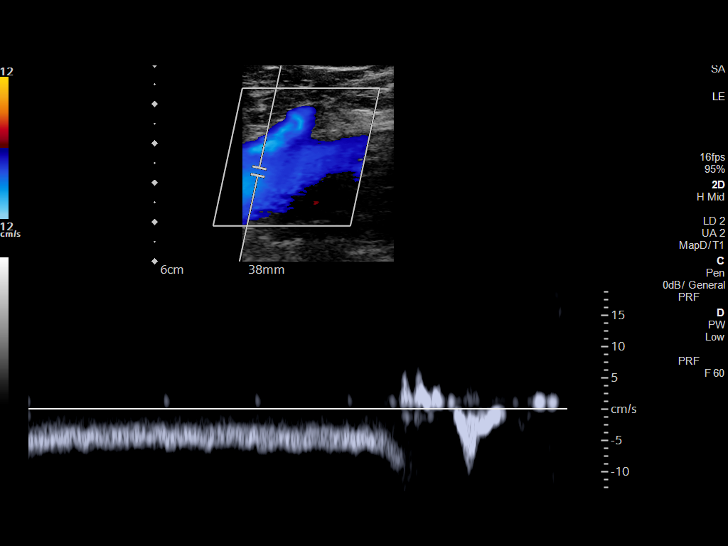
[im 34/53]
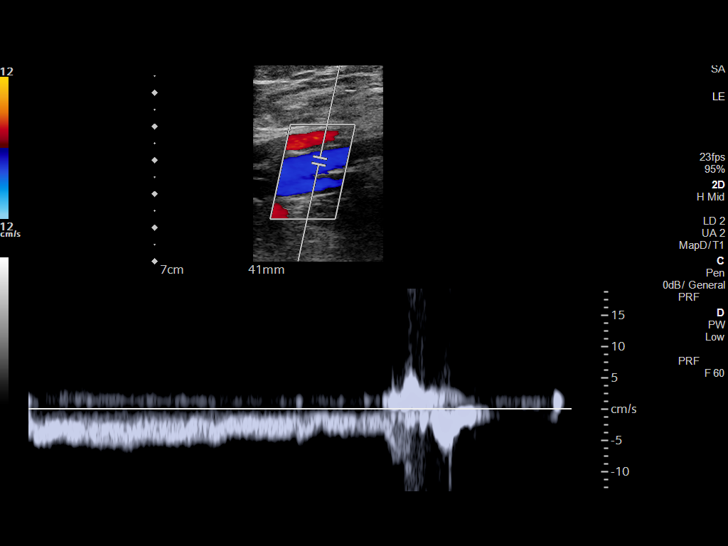
[im 39/53]
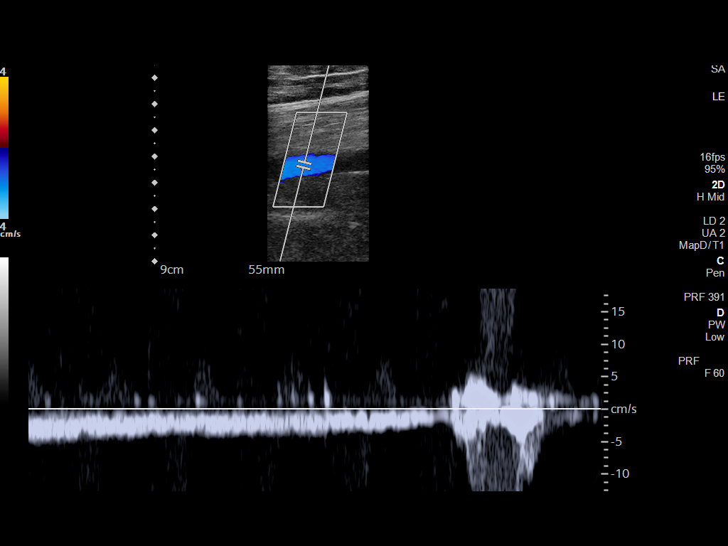
[im 43/53]
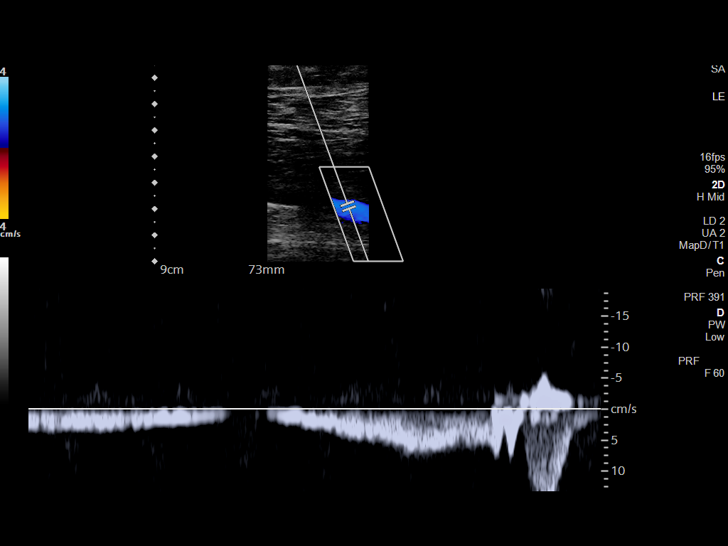
[im 48/53]
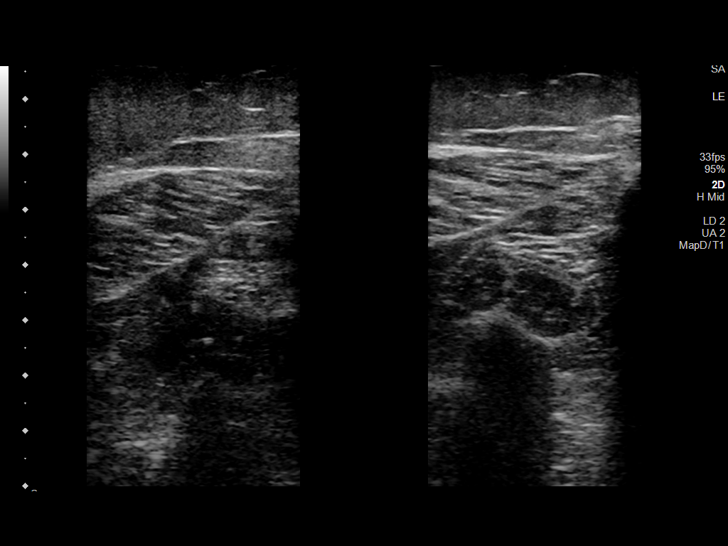
[im 53/53]
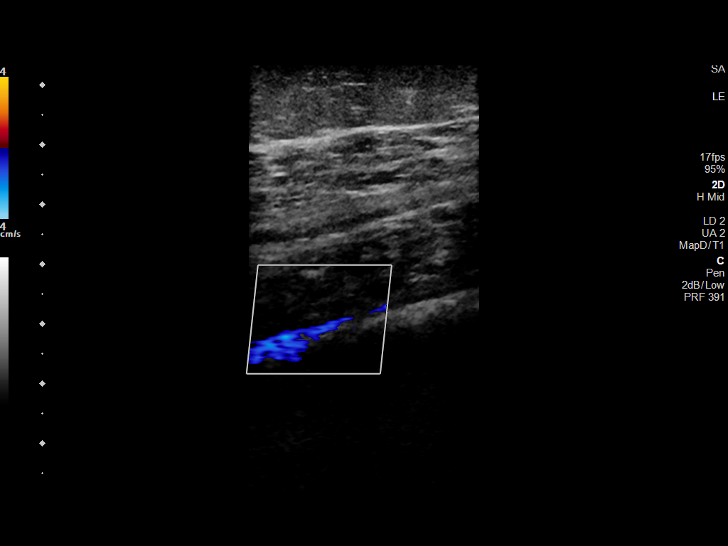

[13 of 24 positions shown; findings below may reference images not displayed]

FINDINGS: RIGHT LOWER EXTREMITY

Common Femoral Vein: No evidence of thrombus. Normal
compressibility, respiratory phasicity and response to augmentation.

Saphenofemoral Junction: No evidence of thrombus. Normal
compressibility and flow on color Doppler imaging.

Profunda Femoral Vein: No evidence of thrombus. Normal
compressibility and flow on color Doppler imaging.

Femoral Vein: No evidence of thrombus. Normal compressibility,
respiratory phasicity and response to augmentation.

Popliteal Vein: No evidence of thrombus. Normal compressibility,
respiratory phasicity and response to augmentation.

Calf Veins: No evidence of thrombus. Normal compressibility and flow
on color Doppler imaging.

Superficial Great Saphenous Vein: No evidence of thrombus. Normal
compressibility.

Venous Reflux:  None.

Other Findings:  None.

LEFT LOWER EXTREMITY

Common Femoral Vein: No evidence of thrombus. Normal
compressibility, respiratory phasicity and response to augmentation.

Saphenofemoral Junction: No evidence of thrombus. Normal
compressibility and flow on color Doppler imaging.

Profunda Femoral Vein: No evidence of thrombus. Normal
compressibility and flow on color Doppler imaging.

Femoral Vein: No evidence of thrombus. Normal compressibility,
respiratory phasicity and response to augmentation.

Popliteal Vein: No evidence of thrombus. Normal compressibility,
respiratory phasicity and response to augmentation.

Calf Veins: No evidence of thrombus. Normal compressibility and flow
on color Doppler imaging.

Superficial Great Saphenous Vein: No evidence of thrombus. Normal
compressibility.

Venous Reflux:  None.

Other Findings:  None.
IMPRESSION: No evidence of deep venous thrombosis in either lower extremity.

## 2022-05-19 IMAGING — CT CT ANGIO CHEST
2 of 8 series · 18 of 36 positions shown · IV contrast (Omnipaque)
Comparison: Same-day chest radiograph

CLINICAL DATA: Tachycardia

EXAM:
CT ANGIOGRAPHY CHEST WITH CONTRAST
TECHNIQUE: Multidetector CT imaging of the chest was performed using the
standard protocol during bolus administration of intravenous
contrast. Multiplanar CT image reconstructions and MIPs were
obtained to evaluate the vascular anatomy.

[Series 6: pe coronal mpr · coronal · 0.59mm/px · 1 of 101 slices shown]
[im 51/101  mediastinal]
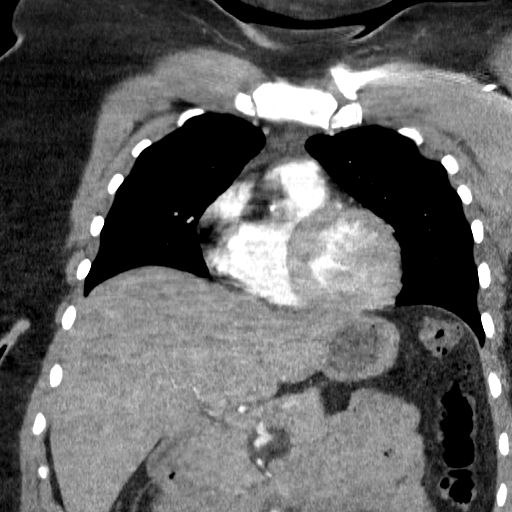

[Series 10: pe thins · axial · 0.76mm/px · z∈[+966,+1236]mm · 17 of 401 slices shown]
[im 21/401  lung]
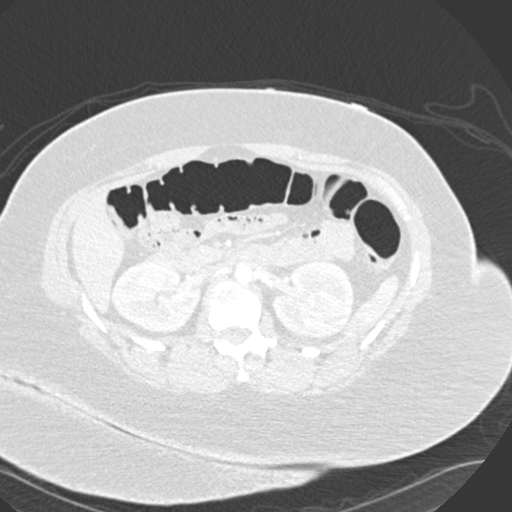
[im 41/401  mediastinal]
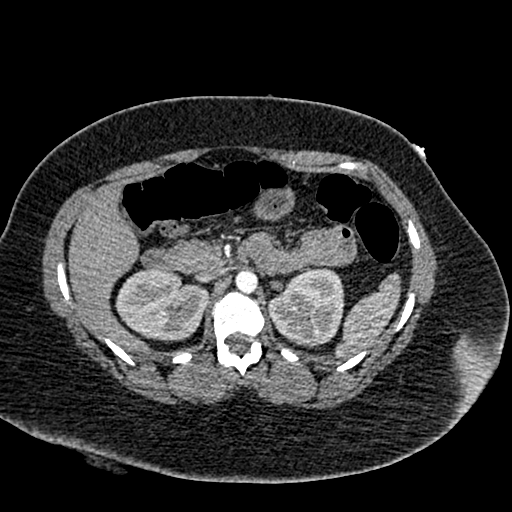
[im 61/401  lung]
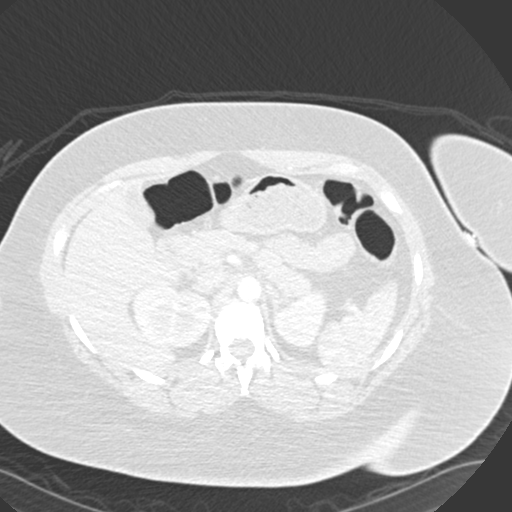
[im 81/401  mediastinal]
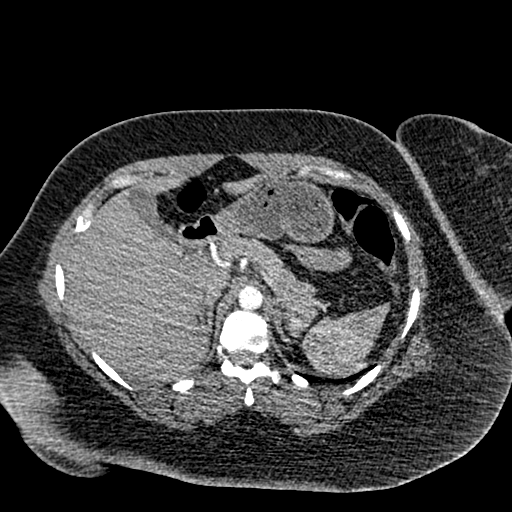
[im 121/401  lung]
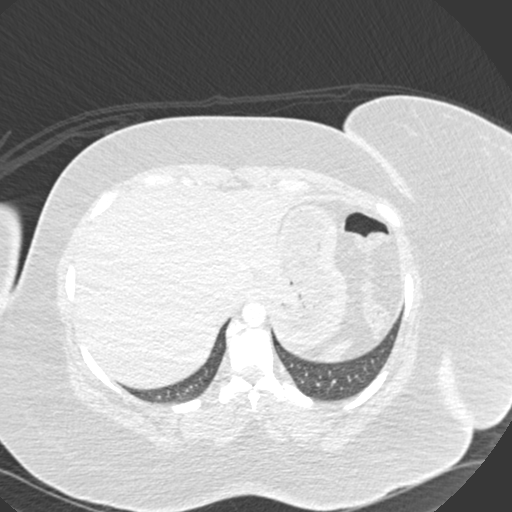
[im 141/401  mediastinal]
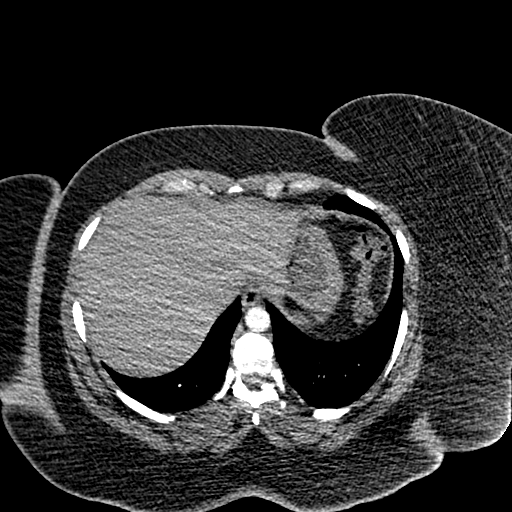
[im 161/401  lung]
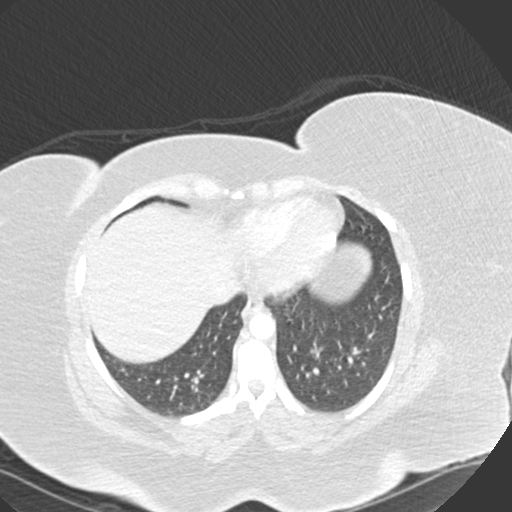
[im 181/401  mediastinal]
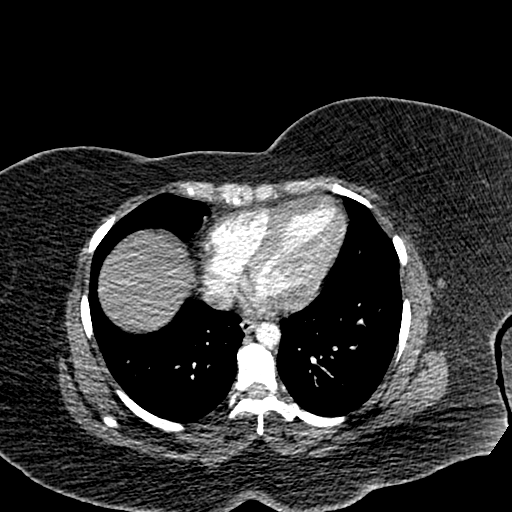
[im 201/401  lung]
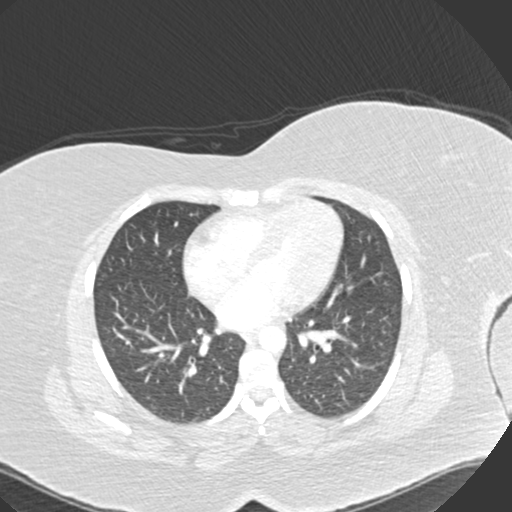
[im 221/401  mediastinal]
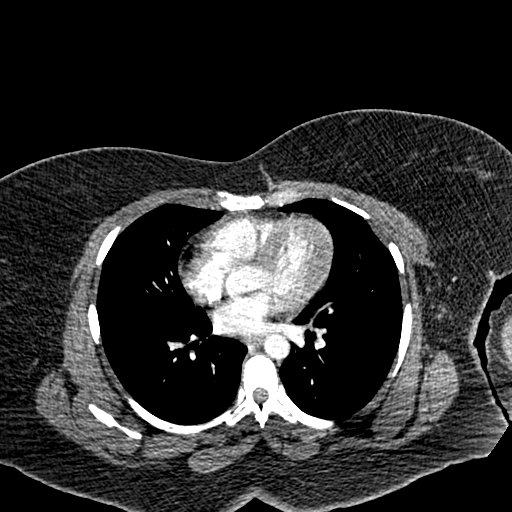
[im 241/401  lung]
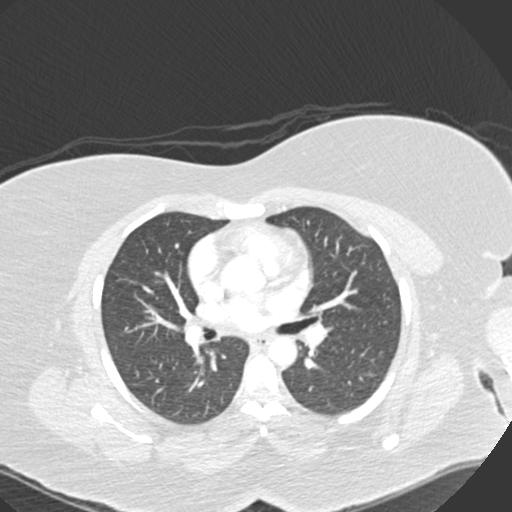
[im 261/401  mediastinal]
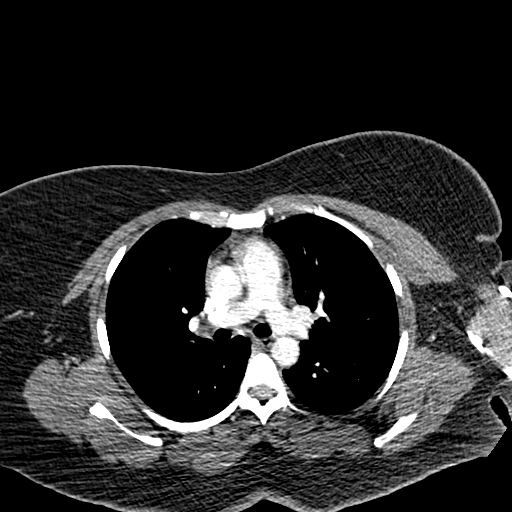
[im 281/401  lung]
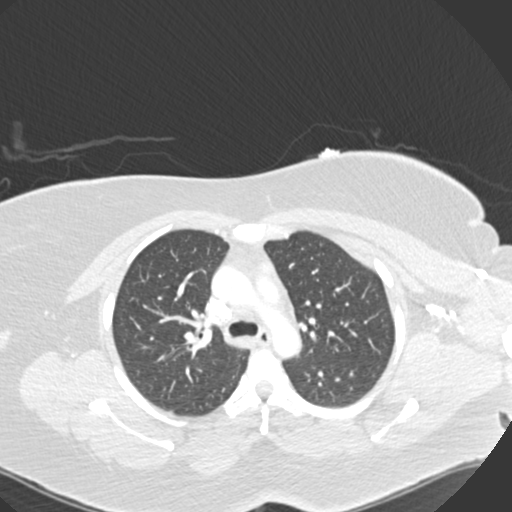
[im 321/401  mediastinal]
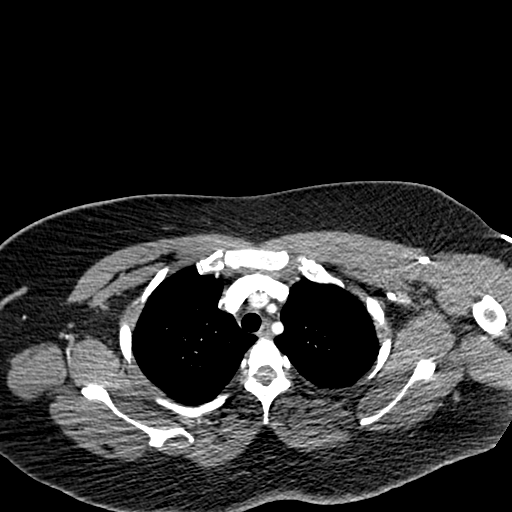
[im 341/401  lung]
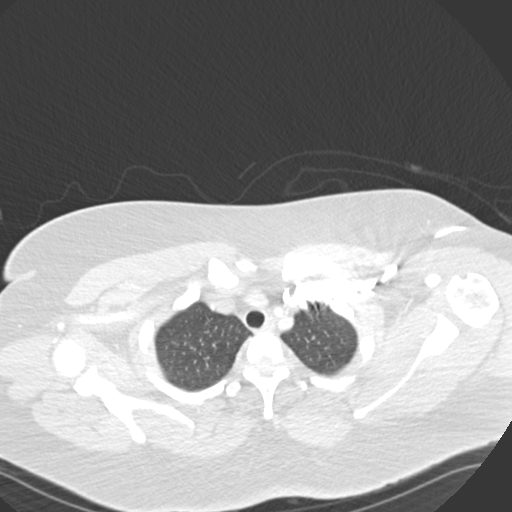
[im 361/401  mediastinal]
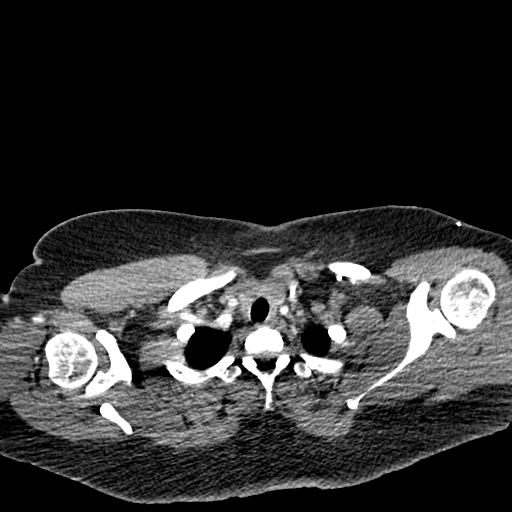
[im 381/401  lung]
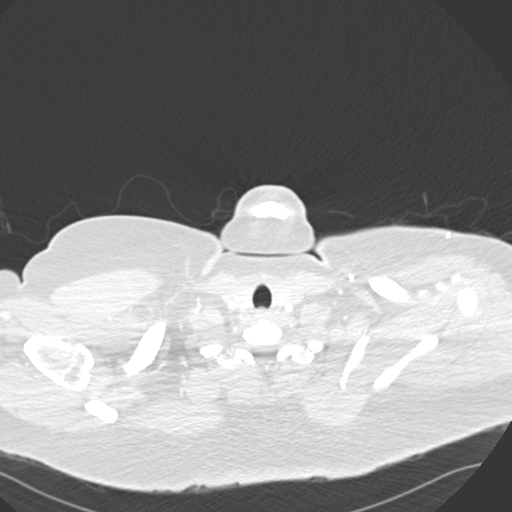

[18 of 36 positions shown; findings below may reference images not displayed]

RADIATION DOSE REDUCTION: This exam was performed according to the
departmental dose-optimization program which includes automated
exposure control, adjustment of the mA and/or kV according to
patient size and/or use of iterative reconstruction technique.

CONTRAST:  100mL OMNIPAQUE IOHEXOL 350 MG/ML SOLN
FINDINGS: Cardiovascular: There is suboptimal opacification of the pulmonary
arteries beyond the lobar level due to bolus timing and patient body
habitus. There is no central pulmonary embolism identified. There is
no evidence of right heart strain. The thoracic aorta is normal in
appearance. The heart is not enlarged. There is no pericardial
effusion.

Mediastinum/Nodes: The thyroid is unremarkable. The esophagus is
grossly unremarkable. There is no mediastinal, hilar, or axillary
lymphadenopathy.

Lungs/Pleura: Trachea and central airways are patent

The lungs are well inflated. There is no focal consolidation or
pulmonary edema. There is no pleural effusion or pneumothorax. There
are no suspicious nodules.

Upper Abdomen: The imaged portions of the upper abdominal viscera
are unremarkable.

Musculoskeletal: There is no acute osseous abnormality or suspicious
osseous lesion.

Review of the MIP images confirms the above findings.
IMPRESSION: 1. Suboptimal opacification of the pulmonary arteries beyond the
lobar level due to bolus timing and patient body habitus. Within
this confine, no central pulmonary embolism is identified. No
evidence of right heart strain.
2. No other evidence of acute cardiopulmonary pathology.

## 2022-05-19 IMAGING — DX DG CHEST 2V
2 series · 2 of 2 positions shown · non-contrast
Comparison: 08/27/2021

CLINICAL DATA: Tachycardia. Pt reports her HR has been elevated
today. Checking it at home with pulse ox. Reports rate 105-115
today. Denies chest pain or SOB. Took BP Med at 7 am. Shielded HX:
HTN

EXAM:
CHEST - 2 VIEW

[chest pa]
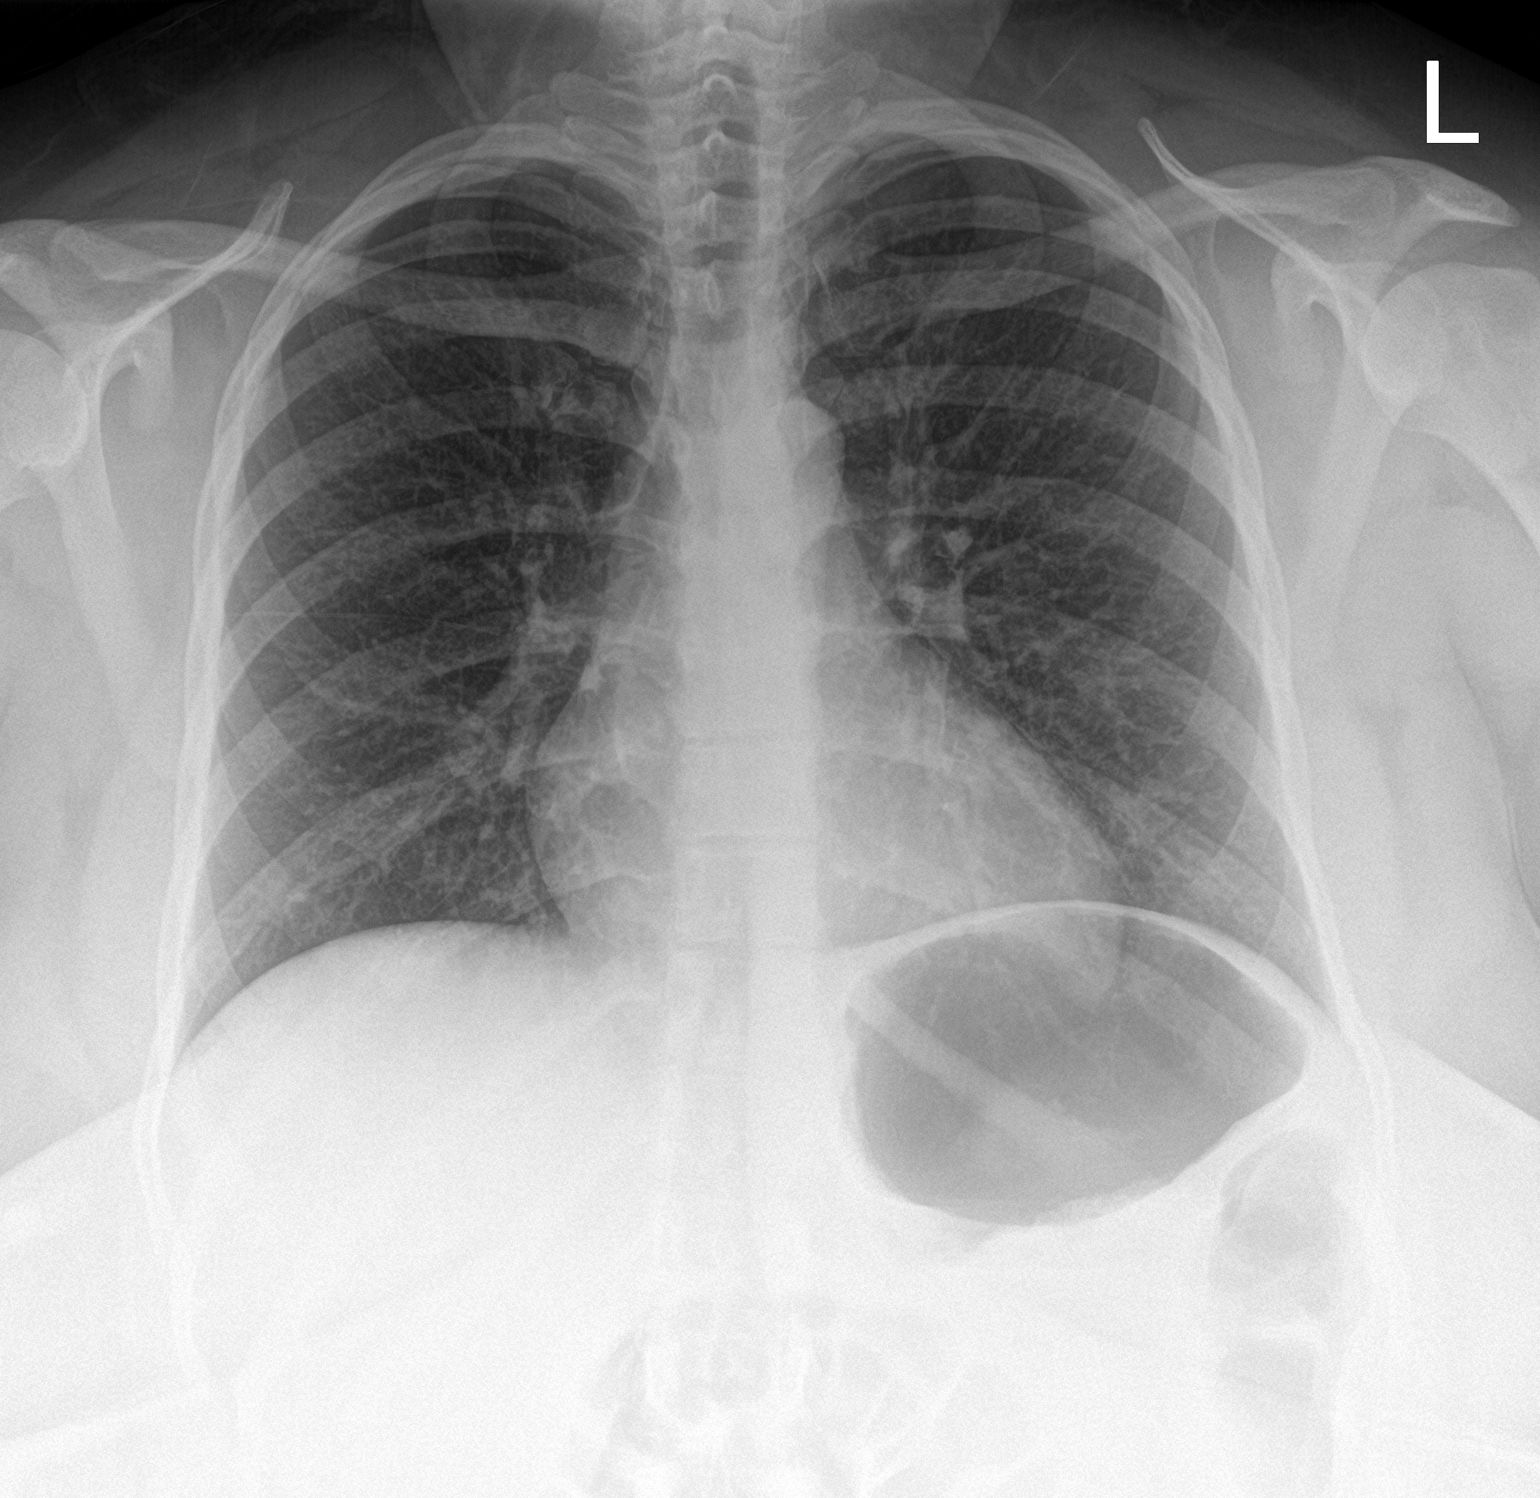

[chest lat]
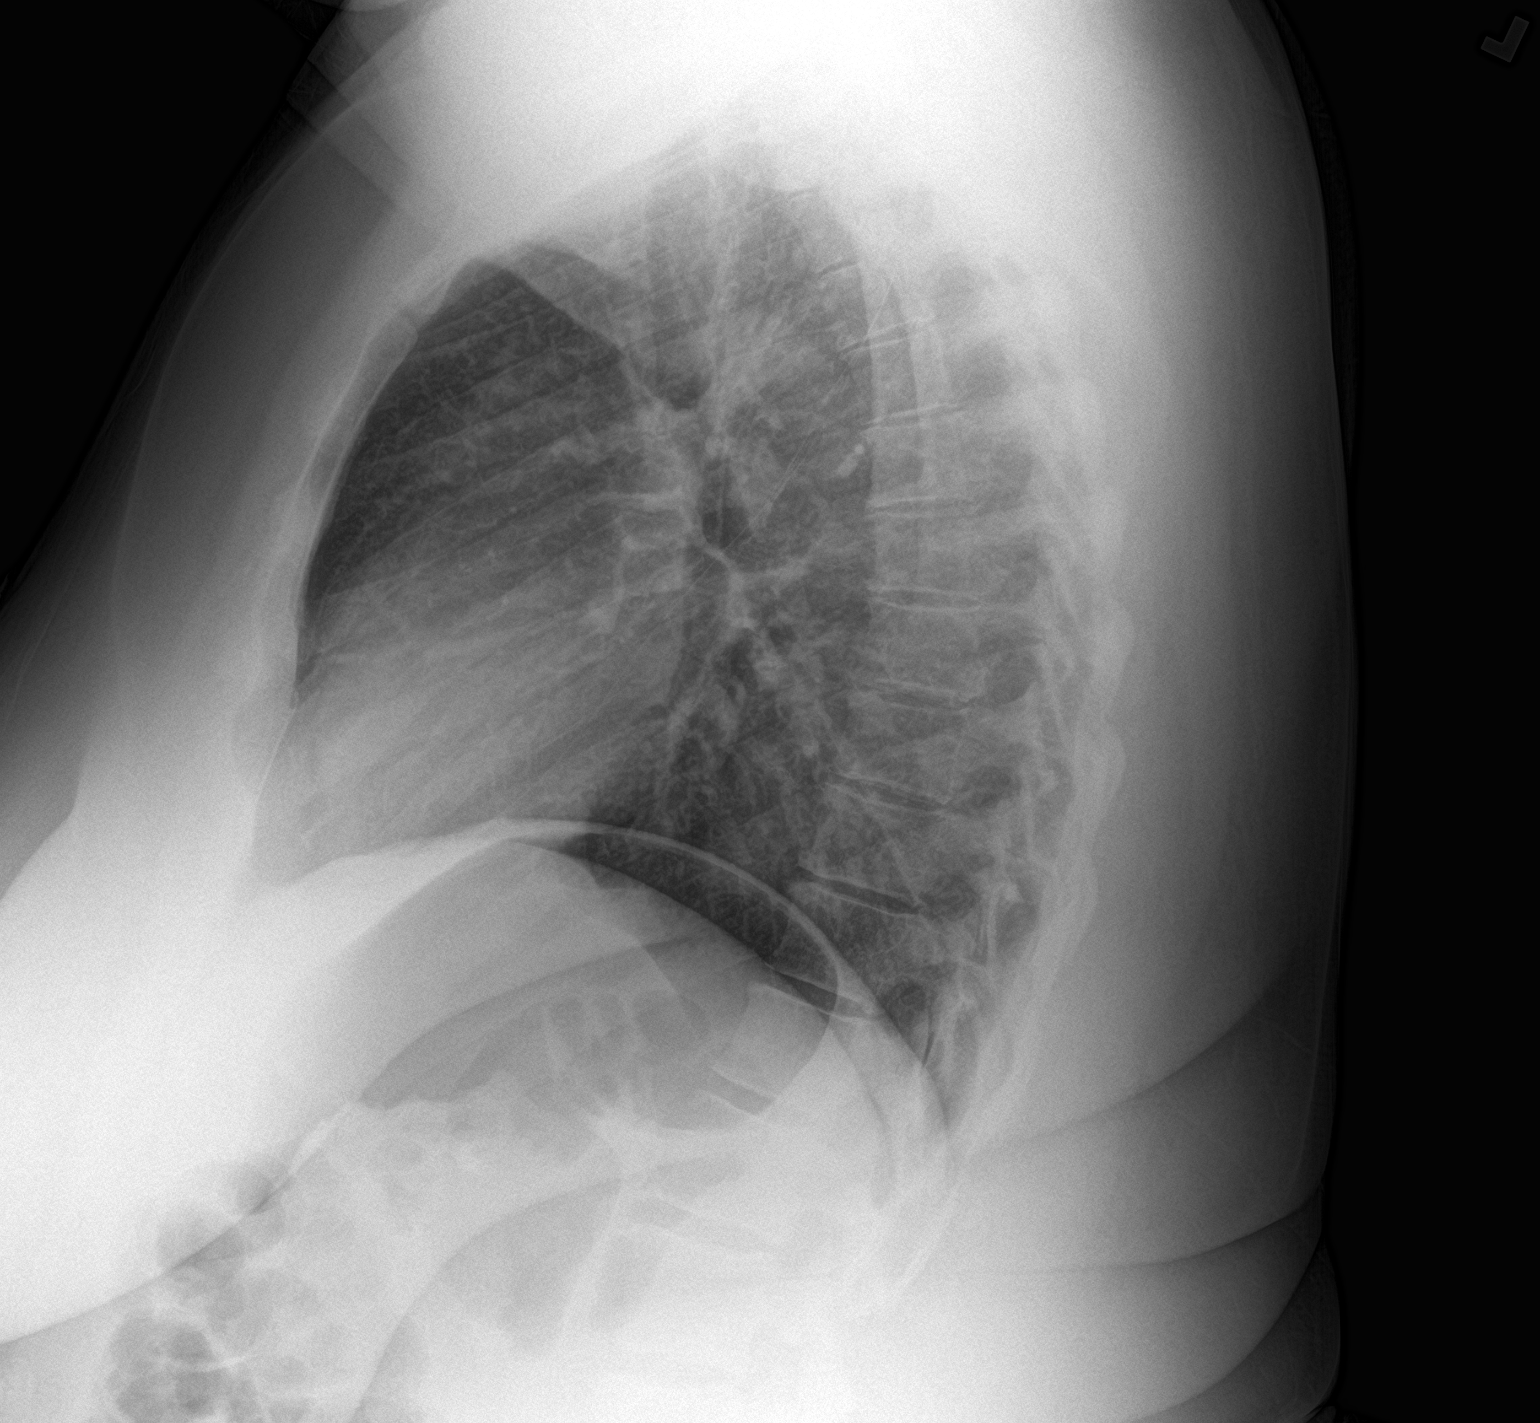

[2 of 2 positions shown; findings below may reference images not displayed]

FINDINGS: Lungs are clear.

Heart size and mediastinal contours are within normal limits.

No effusion.

Visualized bones unremarkable.
IMPRESSION: No acute cardiopulmonary disease.

## 2022-05-31 ENCOUNTER — Encounter (INDEPENDENT_AMBULATORY_CARE_PROVIDER_SITE_OTHER): Payer: Self-pay | Admitting: Internal Medicine

## 2022-05-31 ENCOUNTER — Ambulatory Visit (INDEPENDENT_AMBULATORY_CARE_PROVIDER_SITE_OTHER): Payer: BC Managed Care – PPO | Admitting: Internal Medicine

## 2022-05-31 VITALS — BP 129/74 | HR 88 | Temp 97.8°F | Ht 64.0 in | Wt 279.0 lb

## 2022-05-31 DIAGNOSIS — R7303 Prediabetes: Secondary | ICD-10-CM

## 2022-05-31 DIAGNOSIS — Z6841 Body Mass Index (BMI) 40.0 and over, adult: Secondary | ICD-10-CM | POA: Diagnosis not present

## 2022-05-31 DIAGNOSIS — I10 Essential (primary) hypertension: Secondary | ICD-10-CM | POA: Diagnosis not present

## 2022-05-31 DIAGNOSIS — E669 Obesity, unspecified: Secondary | ICD-10-CM

## 2022-05-31 MED ORDER — METFORMIN HCL ER 500 MG PO TB24: 500.0000 mg | ORAL_TABLET | Freq: Every day | ORAL | 0 refills | Status: DC

## 2022-05-31 NOTE — Progress Notes (Signed)
Office: [phone]  /  Fax: [phone]  WEIGHT SUMMARY AND BIOMETRICS  Medical Weight Loss Height: '5\' 4"'$  (1.626 m) Temp: 97.8 F (36.6 C) Pulse Rate: 88 BP: 129/74 SpO2: 98 % Fasting: No Today's Visit #: 5 Weight at Last VIsit: 273 lb Weight Lost Since Last Visit: 0  Body Fat %: 52.2 % Fat Mass (lbs): 146 lbs Muscle Mass (lbs): 126.8 lbs Total Body Water (lbs): 96.4 lbs Visceral Fat Rating : 17 Starting Date: 02/04/22 Starting Weight: 274 lb Total Weight Loss (lbs): 0 lb (0 kg)    HPI  Chief Complaint: OBESITY  Cassie Duncan is here to discuss her progress with her obesity treatment plan. She is on the following a lower carbohydrate, vegetable and lean protein rich diet plan and states she is following her eating plan approximately 20 % of the time. She states she is exercising 30-75 minutes 7 times per week.   Interval History: Since last office she reports not following plan and has gained 6 pounds.  She also has not been journaling or tracking as we had agreed upon last office visit.  When asked as to barriers she states none she just feels that moving away from the foods that she likes is very difficult for her.  She also does not want to feel restricted although aside from a reduced calorie meal plan we have not restricted any particular foods.  She tends to enjoy convenience foods.  She denies problems with portion control but acknowledges having issues with cravings.  Her goal is to lose weight to improve her overall health and increase her chances of having a healthy pregnancy.  Pharmacotherapy: None  PHYSICAL EXAM:  Blood pressure 129/74, pulse 88, temperature 97.8 F (36.6 C), height '5\' 4"'$  (1.626 m), weight 279 lb (126.6 kg), SpO2 98 %. Body mass index is 47.89 kg/m.  General: She is overweight, cooperative, alert, well developed, and in no acute distress. PSYCH: Has normal mood, affect and thought process.   HEENT: EOMI, sclerae are anicteric. Lungs: Normal  breathing effort, no conversational dyspnea. Extremities: No edema.  Neurologic: No gross sensory or motor deficits. No tremors or fasciculations noted.    DIAGNOSTIC DATA REVIEWED:  BMET    Component Value Date/Time   NA 138 03/04/2022 1056   K 4.6 03/04/2022 1056   CL 102 03/04/2022 1056   CO2 23 03/04/2022 1056   GLUCOSE 84 03/04/2022 1056   GLUCOSE 87 09/18/2021 0909   BUN 9 03/04/2022 1056   CREATININE 0.73 03/04/2022 1056   CREATININE 0.79 09/05/2015 1201   CALCIUM 9.5 03/04/2022 1056   GFRNONAA >60 08/31/2021 1821   Lab Results  Component Value Date   HGBA1C 5.4 03/04/2022   HGBA1C 5.8 (H) 02/23/2012   Lab Results  Component Value Date   INSULIN 5.3 02/04/2022   CBC    Component Value Date/Time   WBC 5.3 03/04/2022 1056   WBC 6.7 09/02/2021 1421   RBC 4.03 03/04/2022 1056   RBC 4.78 09/02/2021 1421   HGB 11.7 03/04/2022 1056   HCT 35.0 03/04/2022 1056   PLT 403.0 (H) 09/02/2021 1421   MCV 87 03/04/2022 1056   MCH 29.0 03/04/2022 1056   MCH 29.4 08/31/2021 1821   MCHC 33.4 03/04/2022 1056   MCHC 33.0 09/02/2021 1421   RDW 12.4 03/04/2022 1056   Iron/TIBC/Ferritin/ %Sat No results found for: "IRON", "TIBC", "FERRITIN", "IRONPCTSAT" Lipid Panel     Component Value Date/Time   CHOL 161 02/04/2022 0953   TRIG  33 02/04/2022 0953   HDL 47 02/04/2022 0953   CHOLHDL 4 09/02/2021 1421   VLDL 9.4 09/02/2021 1421   LDLCALC 106 (H) 02/04/2022 0953   Hepatic Function Panel     Component Value Date/Time   PROT 7.3 03/04/2022 1056   ALBUMIN 3.9 03/04/2022 1056   AST 16 03/04/2022 1056   ALT 10 03/04/2022 1056   ALKPHOS 65 03/04/2022 1056   BILITOT 0.3 03/04/2022 1056      Component Value Date/Time   TSH 1.44 09/02/2021 1421     ASSESSMENT AND PLAN  TREATMENT PLAN FOR OBESITY:  Recommended Dietary Goals  Cassie Duncan is currently in the contemplative stage of change. As such, her goal is to continue weight management plan. She has agreed to keeping a  food journal and adhering to recommended goals of 1200 calories and 100 g of protein.  Behavioral Intervention  We discussed the following Behavioral Modification Strategies today: decreasing eating out, meal planning and cooking strategies, and keeping healthy foods in the home.  Additional resources provided today: She will be referred to Dr. Mallie Mussel for assistance with behavioral strategies.  It seems that patient has lost motivation and finds losing weight effortful.  Recommended Physical Activity Goals  Cassie Duncan has been advised to work up to 150 minutes of moderate intensity aerobic activity a week and strengthening exercises 2-3 times per week for cardiovascular health, weight loss maintenance and preservation of muscle mass.   She has agreed to continue physical activity as is.    Pharmacotherapy We discussed various medication options to help Cassie Duncan with her weight loss efforts and we both agreed to In addition to reduced calorie nutrition plan (RCNP), behavioral strategies and physical activity, Cassie Duncan would benefit from pharmacotherapy to assist with hunger signals, satiety and cravings. This will reduce obesity-related health risks by inducing weight loss, and help reduce food consumption and adherence to Cassie Duncan) . It may also improve QOL by improving self-confidence and reduce the  setbacks associated with metabolic adaptations - which is an inevitable part of this disease.    After discussion of medical treatment options, mechanisms of action, benefits, side effects and risks, indications for long-term use and shared decision making she is agreeable to starting metformin XR 500 mg once a day. Patient also made aware of risk of weight regain following discontinuation of treatment and hence the importance of adhering to medical weight loss plan.    ASSOCIATED CONDITIONS ADDRESSED TODAY  Prediabetes Assessment & Plan: Most recent A1c is  Lab Results  Component Value Date   HGBA1C  5.4 03/04/2022   with associated elevated insulin levels.  Her A1c has improved.  Patient informed of disease state and risk of progression. This may contribute to abnormal cravings, fatigue and diabetes complications without having diabetes.   We reviewed treatment options which include weight loss, increasing physical activity to 150 minutes a week of moderate intensity and pharmacotherapy with metformin.  She is agreeable to starting medical therapy.  This will also assist with weight loss.   Orders: -     metFORMIN HCl ER; Take 1 tablet (500 mg total) by mouth daily with breakfast.  Dispense: 30 tablet; Refill: 0  Hypertension, essential Assessment & Plan: Blood pressure at goal for age and risk category.  On amlodipine 2.5 mg a day and labetalol 100 mg twice daily without adverse effects.  Most recent renal parameters reviewed which showed normal electrolytes and kidney function.  Continue with weight loss therapy.  Monitor for symptoms of  orthostasis while losing weight. Continue current regimen and home monitoring for a goal blood pressure of 120/80.    Obesity, current BMI 47      Return in about 2 weeks (around 06/14/2022) for For Weight Mangement with Dr. Gerarda Fraction.Marland Kitchen She was informed of the importance of frequent follow up visits to maximize her success with intensive lifestyle modifications for her multiple health conditions.   ATTESTASTION STATEMENTS:  Reviewed by clinician on day of visit: allergies, medications, problem list, medical history, surgical history, family history, social history, and previous encounter notes.   Time spent on visit including pre-visit chart review and post-visit care and charting was 30 minutes.    Thomes Dinning, MD

## 2022-05-31 NOTE — Assessment & Plan Note (Signed)
Most recent A1c is  Lab Results  Component Value Date   HGBA1C 5.4 03/04/2022   with associated elevated insulin levels.  Her A1c has improved.  Patient informed of disease state and risk of progression. This may contribute to abnormal cravings, fatigue and diabetes complications without having diabetes.   We reviewed treatment options which include weight loss, increasing physical activity to 150 minutes a week of moderate intensity and pharmacotherapy with metformin.  She is agreeable to starting medical therapy.  This will also assist with weight loss.

## 2022-05-31 NOTE — Assessment & Plan Note (Signed)
Blood pressure at goal for age and risk category.  On amlodipine 2.5 mg a day and labetalol 100 mg twice daily without adverse effects.  Most recent renal parameters reviewed which showed normal electrolytes and kidney function.  Continue with weight loss therapy.  Monitor for symptoms of orthostasis while losing weight. Continue current regimen and home monitoring for a goal blood pressure of 120/80.  

## 2022-06-09 DIAGNOSIS — G4733 Obstructive sleep apnea (adult) (pediatric): Secondary | ICD-10-CM | POA: Diagnosis not present

## 2022-06-15 DIAGNOSIS — D259 Leiomyoma of uterus, unspecified: Secondary | ICD-10-CM | POA: Diagnosis not present

## 2022-06-15 DIAGNOSIS — Z3169 Encounter for other general counseling and advice on procreation: Secondary | ICD-10-CM | POA: Diagnosis not present

## 2022-06-15 DIAGNOSIS — N945 Secondary dysmenorrhea: Secondary | ICD-10-CM | POA: Diagnosis not present

## 2022-06-15 DIAGNOSIS — F3281 Premenstrual dysphoric disorder: Secondary | ICD-10-CM | POA: Diagnosis not present

## 2022-06-17 ENCOUNTER — Ambulatory Visit (INDEPENDENT_AMBULATORY_CARE_PROVIDER_SITE_OTHER): Payer: BC Managed Care – PPO | Admitting: Internal Medicine

## 2022-06-17 ENCOUNTER — Encounter (INDEPENDENT_AMBULATORY_CARE_PROVIDER_SITE_OTHER): Payer: Self-pay | Admitting: Internal Medicine

## 2022-06-17 VITALS — BP 133/83 | HR 82 | Temp 98.3°F | Ht 64.0 in | Wt 282.0 lb

## 2022-06-17 DIAGNOSIS — I10 Essential (primary) hypertension: Secondary | ICD-10-CM | POA: Diagnosis not present

## 2022-06-17 DIAGNOSIS — E669 Obesity, unspecified: Secondary | ICD-10-CM

## 2022-06-17 DIAGNOSIS — R7303 Prediabetes: Secondary | ICD-10-CM | POA: Diagnosis not present

## 2022-06-17 DIAGNOSIS — Z6841 Body Mass Index (BMI) 40.0 and over, adult: Secondary | ICD-10-CM

## 2022-06-17 NOTE — Progress Notes (Signed)
Office: [phone]  /  Fax: [phone]  WEIGHT SUMMARY AND BIOMETRICS  Medical Weight Loss Height: 5' 4"$  (1.626 m) Weight: 282 lb (127.9 kg) Temp: 98.3 F (36.8 C) Pulse Rate: 82 BP: 133/83 SpO2: 99 % Fasting: no Labs: no Today's Visit #: 6 Weight at Last VIsit: 279 lb Weight Lost Since Last Visit: +3  Body Fat %: 53.1 % Fat Mass (lbs): 149.8 lbs Muscle Mass (lbs): 125.6 lbs Total Body Water (lbs): 101.4 lbs Visceral Fat Rating : 17 Peak Weight: 312 lb Starting Date: 02/04/22 Starting Weight: 274 lb Total Weight Loss (lbs): 0 lb (0 kg)    HPI  Chief Complaint: OBESITY  Cassie Duncan is here to discuss her progress with her obesity treatment plan. She is on the following a lower carbohydrate, vegetable and lean protein rich diet plan and states she is following her eating plan approximately 30 % of the time. She states she is exercising 30-90 minutes 7 times per week.   Interval History:  Since last office visit she has gained 3 pounds.  She expresses frustration and a sense of discouragement because of her lack of progress and also negative self talk.  She reflects on 20 years of trying to lose weight in an attempt to improve her overall health and have a healthy baby in the future.  She acknowledges liking and enjoying high calorie foods which makes transitioning to healthy foods very difficult for her.  She was not interested in pharmacotherapy because of concerns of medication side effects.  She has been engaging in physical activity but has not been able to lose weight.  I have prescribed metformin at the last office visit to help with cravings and insulin resistance but she did not start the medication.  I also have referred her to behavioral health specialist but an appointment has not been made.   Pharmacotherapy: None  PHYSICAL EXAM:  Blood pressure 133/83, pulse 82, temperature 98.3 F (36.8 C), height 5' 4"$  (1.626 m), weight 282 lb (127.9 kg), SpO2 99 %. Body  mass index is 48.41 kg/m.  General: She is overweight, cooperative, alert, well developed, and in no acute distress. PSYCH: Has normal mood, affect and thought process.   HEENT: EOMI, sclerae are anicteric. Lungs: Normal breathing effort, no conversational dyspnea. Extremities: No edema.  Neurologic: No gross sensory or motor deficits. No tremors or fasciculations noted.    ASSESSMENT AND PLAN  TREATMENT PLAN FOR OBESITY:  Recommended Dietary Goals  Cassie Duncan is currently in the action stage of change. As such, her goal is to continue weight management plan. She has agreed to practicing portion control and making smarter food choices, such as increasing vegetables and decreasing simple carbohydrates.  Behavioral Intervention  We discussed the following Behavioral Modification Strategies today: increasing lean protein intake, decreasing simple carbohydrates , increasing vegetables, increase water intake, decrease eating out, avoid or reduce consumption of processed foods, and referral to behavioral health specialist .  Additional resources provided today: NA  Recommended Physical Activity Goals  Cassie Duncan has been advised to work up to 150 minutes of moderate intensity aerobic activity a week and strengthening exercises 2-3 times per week for cardiovascular health, weight loss maintenance and preservation of muscle mass.   She has agreed to continue physical activity as is.    Pharmacotherapy We discussed various medication options to help Cassie Duncan with her weight loss efforts and we both agreed to continue with nutritional and behavioral strategies.  ASSOCIATED CONDITIONS ADDRESSED TODAY  Hypertension, essential Assessment &  Plan: Blood pressure at goal for age and risk category.  On amlodipine 2.5 mg a day and labetalol 100 mg twice daily without adverse effects.  Most recent renal parameters reviewed which showed normal electrolytes and kidney function.  Continue with weight  loss therapy.  Monitor for symptoms of orthostasis while losing weight. Continue current regimen and home monitoring for a goal blood pressure of 120/80.    Obesity, current BMI 47  Prediabetes Assessment & Plan: Her most recent A1c was 5.4 and improved. Her HOMA-IR is 1.08 so no significant insulin resistance.  I would like her to start metformin for incretin effect as this may help her with satiety and decrease cravings for highly palatable foods.  She is also a candidate for incretin therapy if covered by her insurance that she is amenable to this.        DIAGNOSTIC DATA REVIEWED:  BMET    Component Value Date/Time   NA 138 03/04/2022 1056   K 4.6 03/04/2022 1056   CL 102 03/04/2022 1056   CO2 23 03/04/2022 1056   GLUCOSE 84 03/04/2022 1056   GLUCOSE 87 09/18/2021 0909   BUN 9 03/04/2022 1056   CREATININE 0.73 03/04/2022 1056   CREATININE 0.79 09/05/2015 1201   CALCIUM 9.5 03/04/2022 1056   GFRNONAA >60 08/31/2021 1821   Lab Results  Component Value Date   HGBA1C 5.4 03/04/2022   HGBA1C 5.8 (H) 02/23/2012   Lab Results  Component Value Date   INSULIN 5.3 02/04/2022   Lab Results  Component Value Date   TSH 1.44 09/02/2021   CBC    Component Value Date/Time   WBC 5.3 03/04/2022 1056   WBC 6.7 09/02/2021 1421   RBC 4.03 03/04/2022 1056   RBC 4.78 09/02/2021 1421   HGB 11.7 03/04/2022 1056   HCT 35.0 03/04/2022 1056   PLT 403.0 (H) 09/02/2021 1421   MCV 87 03/04/2022 1056   MCH 29.0 03/04/2022 1056   MCH 29.4 08/31/2021 1821   MCHC 33.4 03/04/2022 1056   MCHC 33.0 09/02/2021 1421   RDW 12.4 03/04/2022 1056   Iron Studies No results found for: "IRON", "TIBC", "FERRITIN", "IRONPCTSAT" Lipid Panel     Component Value Date/Time   CHOL 161 02/04/2022 0953   TRIG 33 02/04/2022 0953   HDL 47 02/04/2022 0953   CHOLHDL 4 09/02/2021 1421   VLDL 9.4 09/02/2021 1421   LDLCALC 106 (H) 02/04/2022 0953   Hepatic Function Panel     Component Value Date/Time    PROT 7.3 03/04/2022 1056   ALBUMIN 3.9 03/04/2022 1056   AST 16 03/04/2022 1056   ALT 10 03/04/2022 1056   ALKPHOS 65 03/04/2022 1056   BILITOT 0.3 03/04/2022 1056      Component Value Date/Time   TSH 1.44 09/02/2021 1421   Nutritional Lab Results  Component Value Date   VD25OH 47.3 03/04/2022      Return in about 3 weeks (around 07/08/2022) for For Weight Mangement with Dr. Gerarda Fraction - 40 minutes. PLEASE SCHEDULE AN APPOINTMENT WITH DR. BARKER.Marland Kitchen She was informed of the importance of frequent follow up visits to maximize her success with intensive lifestyle modifications for her multiple health conditions.    ATTESTASTION STATEMENTS:  Reviewed by clinician on day of visit: allergies, medications, problem list, medical history, surgical history, family history, social history, and previous encounter notes.   Time spent on visit including pre-visit chart review and post-visit care and charting was 30 minutes.    Thomes Dinning, MD
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
## 2022-06-17 NOTE — Assessment & Plan Note (Signed)
Her most recent A1c was 5.4 and improved. Her HOMA-IR is 1.08 so no significant insulin resistance.  I would like her to start metformin for incretin effect as this may help her with satiety and decrease cravings for highly palatable foods.  She is also a candidate for incretin therapy if covered by her insurance that she is amenable to this.

## 2022-06-17 NOTE — Assessment & Plan Note (Signed)
Blood pressure at goal for age and risk category.  On amlodipine 2.5 mg a day and labetalol 100 mg twice daily without adverse effects.  Most recent renal parameters reviewed which showed normal electrolytes and kidney function.  Continue with weight loss therapy.  Monitor for symptoms of orthostasis while losing weight. Continue current regimen and home monitoring for a goal blood pressure of 120/80.

## 2022-06-25 DIAGNOSIS — G4733 Obstructive sleep apnea (adult) (pediatric): Secondary | ICD-10-CM | POA: Diagnosis not present

## 2022-06-25 DIAGNOSIS — R0683 Snoring: Secondary | ICD-10-CM | POA: Diagnosis not present

## 2022-06-25 DIAGNOSIS — R002 Palpitations: Secondary | ICD-10-CM | POA: Diagnosis not present

## 2022-06-25 DIAGNOSIS — Z1330 Encounter for screening examination for mental health and behavioral disorders, unspecified: Secondary | ICD-10-CM | POA: Diagnosis not present

## 2022-06-28 ENCOUNTER — Telehealth (INDEPENDENT_AMBULATORY_CARE_PROVIDER_SITE_OTHER): Payer: BC Managed Care – PPO | Admitting: Psychology

## 2022-06-28 DIAGNOSIS — F5089 Other specified eating disorder: Secondary | ICD-10-CM

## 2022-07-06 DIAGNOSIS — G4733 Obstructive sleep apnea (adult) (pediatric): Secondary | ICD-10-CM | POA: Diagnosis not present

## 2022-07-08 ENCOUNTER — Ambulatory Visit (INDEPENDENT_AMBULATORY_CARE_PROVIDER_SITE_OTHER): Payer: BC Managed Care – PPO | Admitting: Internal Medicine

## 2022-07-08 ENCOUNTER — Encounter (INDEPENDENT_AMBULATORY_CARE_PROVIDER_SITE_OTHER): Payer: Self-pay | Admitting: Internal Medicine

## 2022-07-08 VITALS — BP 138/84 | HR 76 | Temp 98.2°F | Ht 64.0 in | Wt 289.0 lb

## 2022-07-08 DIAGNOSIS — Z5181 Encounter for therapeutic drug level monitoring: Secondary | ICD-10-CM

## 2022-07-08 DIAGNOSIS — R7303 Prediabetes: Secondary | ICD-10-CM

## 2022-07-08 DIAGNOSIS — R638 Other symptoms and signs concerning food and fluid intake: Secondary | ICD-10-CM

## 2022-07-08 DIAGNOSIS — Z6841 Body Mass Index (BMI) 40.0 and over, adult: Secondary | ICD-10-CM

## 2022-07-08 DIAGNOSIS — I10 Essential (primary) hypertension: Secondary | ICD-10-CM

## 2022-07-08 DIAGNOSIS — E669 Obesity, unspecified: Secondary | ICD-10-CM

## 2022-07-08 NOTE — Assessment & Plan Note (Signed)
Her blood pressure slightly above target of less than 120/80.  She is currently on amlodipine and labetalol.  I recommend she work with her primary care physician to adjust blood pressure medications and possibly consider switching to a weight neutral blood pressure drug like ARB, hydrochlorothiazide or the following beta-blockers: Bystolic and carvedilol.  Patient was overly concerned about medication side effects as she wants to become pregnant in the future which limited medication choices.  She is now open to trying other therapies

## 2022-07-08 NOTE — Progress Notes (Signed)
Office: [phone]  /  Fax: [phone]  WEIGHT SUMMARY AND BIOMETRICS  Vitals Temp: 98.2 F (36.8 C) BP: 138/84 Pulse Rate: 76 SpO2: 100 %   Anthropometric Measurements Height: '5\' 4"'$  (1.626 m) Weight: 289 lb (131.1 kg) BMI (Calculated): 49.58 Weight at Last Visit: 282 lb Weight Lost Since Last Visit: +7 Starting Weight: 274 lb Total Weight Loss (lbs): 0 lb (0 kg) Peak Weight: 312 lb   Body Composition  Body Fat %: 54.4 % Fat Mass (lbs): 157.4 lbs Muscle Mass (lbs): 125.2 lbs Visceral Fat Rating : 18    HPI  Chief Complaint: OBESITY  Cassie Duncan is here to discuss her progress with her obesity treatment plan. She is on the the [facility] and states she is following her eating plan approximately 80 % of the time. She states she is exercising 30 minutes 2-3 times per week.  Interval History:  Since last office visit she has gained 7 pounds. She reports she has been tracking and journaling calories and acknowledges exceeding calorie target and some days not meeting protein intake.  She has met with our behavioral health specialist and will continue to work with her on cognitive behavioral therapy.  Review of micronutrients shows high consumption of carbs and added sugars coming from prepackaged foods. '[]'$ Denies '[x]'$ Reports problems with appetite and hunger signals.  '[]'$ Denies '[x]'$ Reports problems with satiety and satiation.  '[]'$ Denies '[x]'$ Reports problems with eating patterns and portion control.  '[]'$ Denies '[x]'$ Reports abnormal cravings Barriers identified inability to focus on healthy eating, cost of medication, and other: Dissonance between personal goals and nutritional activities .   Pharmacotherapy for weight loss: She is currently taking Metformin (off label use for incretin effect and / or insulin resistance and / or diabetes prevention) .  She notes no improvement in appetite or satiety.  She denies any adverse effects From Weight promoting medications  identified: Beta-blockers.  ASSESSMENT AND PLAN  TREATMENT PLAN FOR OBESITY:  Recommended Dietary Goals  Cassie Duncan is currently in the action stage of change. As such, her goal is to continue weight management plan. She has agreed to: keeping a food journal and adhering to recommended goals of 1200 calories and 90 to 100 g of protein and the [place].  Behavioral Intervention  We discussed the following Behavioral Modification Strategies today: increasing lean protein intake, decreasing simple carbohydrates , increasing vegetables, increasing lower glycemic fruits, increasing fiber rich foods, increasing water intake, identifying sources and decreasing liquid calories, work on meal planning and easy cooking plans, work on tracking and journaling calories using tracking App, decreasing eating out, consumption of processed foods, and making healthy choices when eating convenient foods, reading food labels , and avoiding temptations and identifying enticing environmental cues. She will continue to follow-up with Dr. Mallie Mussel for cognitive behavioral therapy.  Additional resources provided today: None  Recommended Physical Activity Goals  Cassie Duncan has been advised to work up to 150 minutes of moderate intensity aerobic activity a week and strengthening exercises 2-3 times per week for cardiovascular health, weight loss maintenance and preservation of muscle mass.   She has agreed to :  '[]'$  Continue current level of physical activity  '[x]'$  Think about ways to increase physical activity '[]'$  Start strengthening exercises with a goal of 2-3 sessions a week  '[]'$  Start aerobic activity with a goal of 150 minutes a week at moderate intensity.  '[]'$  Increase the intensity, frequency or duration of strengthening exercises  '[]'$  Increase the intensity, frequency or duration of aerobic exercises   '[]'$   Increase physical activity in their day and reduce sedentary time (increase NEAT). '[]'$  Work on scheduling and  tracking physical activity.   Pharmacotherapy We discussed various medication options to help Cassie Duncan with her weight loss efforts and we both agreed to : Explore incretin therapy.  This will aid her in her efforts to reduce calories.  She has a very strong hunger signals and cravings which makes difficult adhering to nutrition plan.  She will look into insurance coverage for GLP-1 drugs and let us know what is covered.  These medications are not covered then we will try topiramate as next step.  We are checking urine pregnancy test.  She reports being abstinent and risk of pregnancy is low.  We reviewed medication side effects and also guided her to do her own research online at reliable websites that when she can make an informed decision whether she would like to proceed.  She is not a good candidate for phentermine as she had palpitations to this in the past.  She did not notice any improvement on metformin therefore medication will be discontinued.  I would also like for her to work with her PCP to switch to a weight neutral beta-blocker like Bystolic or carvedilol versus labetalol if no contraindications exist.  ASSOCIATED CONDITIONS ADDRESSED TODAY  Therapeutic drug monitoring Assessment & Plan: In anticipation to using topiramate for weight management we will check a baseline urine pregnancy test she is abstinent and therefore is low risk for pregnancy.  She was counseled on the risk of teratogenicity associated with antiobesity medications.  Orders: -     hCG, serum, qualitative  Prediabetes Assessment & Plan: Based on history.  Her most recent A1c and even HOMA-IR do not suggest significant insulin resistance.  She will continue to implement nutritional strategies and we have some work to do to reduced simple sugars and added sugars from diet.   Hypertension, essential Assessment & Plan: Her blood pressure slightly above target of less than 120/80.  She is currently on amlodipine and  labetalol.  I recommend she work with her primary care physician to adjust blood pressure medications and possibly consider switching to a weight neutral blood pressure drug like ARB, hydrochlorothiazide or the following beta-blockers: Bystolic and carvedilol.  Patient was overly concerned about medication side effects as she wants to become pregnant in the future which limited medication choices.  She is now open to trying other therapies   Obesity, current BMI 47  Abnormal craving Assessment & Plan: Neurohormonal and psychological.  She is currently working with Dr. Mallie Mussel for cognitive behavioral therapy.  This patient would also benefit from incretin therapy if not cost prohibitive.  She will look into coverage.  Otherwise we will consider topiramate to assist her weight loss efforts.      PHYSICAL EXAM:  Blood pressure 138/84, pulse 76, temperature 98.2 F (36.8 C), height '5\' 4"'$  (1.626 m), weight 289 lb (131.1 kg), SpO2 100 %. Body mass index is 49.61 kg/m.  General: She is overweight, cooperative, alert, well developed, and in no acute distress. PSYCH: Has normal mood, affect and thought process.   HEENT: EOMI, sclerae are anicteric. Lungs: Normal breathing effort, no conversational dyspnea. Extremities: No edema.  Neurologic: No gross sensory or motor deficits. No tremors or fasciculations noted.    DIAGNOSTIC DATA REVIEWED:  BMET    Component Value Date/Time   NA 138 03/04/2022 1056   K 4.6 03/04/2022 1056   CL 102 03/04/2022 1056   CO2 23  03/04/2022 1056   GLUCOSE 84 03/04/2022 1056   GLUCOSE 87 09/18/2021 0909   BUN 9 03/04/2022 1056   CREATININE 0.73 03/04/2022 1056   CREATININE 0.79 09/05/2015 1201   CALCIUM 9.5 03/04/2022 1056   GFRNONAA >60 08/31/2021 1821   Lab Results  Component Value Date   HGBA1C 5.4 03/04/2022   HGBA1C 5.8 (H) 02/23/2012   Lab Results  Component Value Date   INSULIN 5.3 02/04/2022   Lab Results  Component Value Date   TSH 1.44  09/02/2021   CBC    Component Value Date/Time   WBC 5.3 03/04/2022 1056   WBC 6.7 09/02/2021 1421   RBC 4.03 03/04/2022 1056   RBC 4.78 09/02/2021 1421   HGB 11.7 03/04/2022 1056   HCT 35.0 03/04/2022 1056   PLT 403.0 (H) 09/02/2021 1421   MCV 87 03/04/2022 1056   MCH 29.0 03/04/2022 1056   MCH 29.4 08/31/2021 1821   MCHC 33.4 03/04/2022 1056   MCHC 33.0 09/02/2021 1421   RDW 12.4 03/04/2022 1056   Iron Studies No results found for: "IRON", "TIBC", "FERRITIN", "IRONPCTSAT" Lipid Panel     Component Value Date/Time   CHOL 161 02/04/2022 0953   TRIG 33 02/04/2022 0953   HDL 47 02/04/2022 0953   CHOLHDL 4 09/02/2021 1421   VLDL 9.4 09/02/2021 1421   LDLCALC 106 (H) 02/04/2022 0953   Hepatic Function Panel     Component Value Date/Time   PROT 7.3 03/04/2022 1056   ALBUMIN 3.9 03/04/2022 1056   AST 16 03/04/2022 1056   ALT 10 03/04/2022 1056   ALKPHOS 65 03/04/2022 1056   BILITOT 0.3 03/04/2022 1056      Component Value Date/Time   TSH 1.44 09/02/2021 1421   Nutritional Lab Results  Component Value Date   VD25OH 47.3 03/04/2022     Return in about 2 weeks (around 07/22/2022) for For Weight Mangement with Dr. Gerarda Fraction.Marland Kitchen She was informed of the importance of frequent follow up visits to maximize her success with intensive lifestyle modifications for her multiple health conditions.   ATTESTASTION STATEMENTS:  Reviewed by clinician on day of visit: allergies, medications, problem list, medical history, surgical history, family history, social history, and previous encounter notes.   Time spent on visit including pre-visit chart review and post-visit care and charting was 30 minutes.    Thomes Dinning, MD

## 2022-07-08 NOTE — Assessment & Plan Note (Signed)
In anticipation to using topiramate for weight management we will check a baseline urine pregnancy test she is abstinent and therefore is low risk for pregnancy.  She was counseled on the risk of teratogenicity associated with antiobesity medications.

## 2022-07-08 NOTE — Assessment & Plan Note (Signed)
Neurohormonal and psychological.  She is currently working with Dr. Mallie Mussel for cognitive behavioral therapy.  This patient would also benefit from incretin therapy if not cost prohibitive.  She will look into coverage.  Otherwise we will consider topiramate to assist her weight loss efforts.

## 2022-07-08 NOTE — Assessment & Plan Note (Signed)
Based on history.  Her most recent A1c and even HOMA-IR do not suggest significant insulin resistance.  She will continue to implement nutritional strategies and we have some work to do to reduced simple sugars and added sugars from diet.

## 2022-07-09 LAB — HEMOGLOBIN A1C
Est. average glucose Bld gHb Est-mCnc: 108 mg/dL
Hgb A1c MFr Bld: 5.4 % (ref 4.8–5.6)

## 2022-07-12 ENCOUNTER — Encounter (INDEPENDENT_AMBULATORY_CARE_PROVIDER_SITE_OTHER): Payer: Self-pay

## 2022-07-12 ENCOUNTER — Telehealth (INDEPENDENT_AMBULATORY_CARE_PROVIDER_SITE_OTHER): Payer: BC Managed Care – PPO | Admitting: Psychology

## 2022-07-12 DIAGNOSIS — F5089 Other specified eating disorder: Secondary | ICD-10-CM | POA: Diagnosis not present

## 2022-07-12 NOTE — Progress Notes (Signed)
  Office: [phone]  /  Fax: [phone]    Date: July 12, 2022    Appointment Start Time: 4:01pm Duration: 35 minutes Provider: Glennie Isle, Psy.D. Type of Session: Individual Therapy  Location of Patient: Home (private location) Location of Provider: Provider's Home (private office) Type of Contact: Telepsychological Visit via MyChart Video Visit  Session Content: Cassie Duncan is a 37 y.o. female presenting for a follow-up appointment to address the previously established treatment goal of increasing coping skills.Today's appointment was a telepsychological visit. Cassie Duncan provided verbal consent for today's telepsychological appointment and she is aware she is responsible for securing confidentiality on her end of the session. Prior to proceeding with today's appointment, Cassie Duncan physical location at the time of this appointment was obtained as well a phone number she could be reached at in the event of technical difficulties. Cassie Duncan and this provider participated in today's telepsychological service.   This provider conducted a brief check-in. Cassie Duncan reported, "I'm doing a little bit better." Further explored and processed. Discussed the importance of protein intake and eating regularly. Psychoeducation regarding triggers for emotional eating was provided. Cassie Duncan was provided a handout, and encouraged to utilize the handout between now and the next appointment to increase awareness of triggers and frequency. Cassie Duncan agreed. This provider also discussed behavioral strategies for specific triggers, such as placing the utensil down when conversing to avoid mindless eating. Cassie Duncan provided verbal consent during today's appointment for this provider to send a handout about triggers via e-mail. Overall, Cassie Duncan was receptive to today's appointment as evidenced by openness to sharing, responsiveness to feedback, and willingness to explore triggers for emotional eating.  Mental Status  Examination:  Appearance: neat Behavior: appropriate to circumstances Mood: neutral Affect: mood congruent Speech: WNL Eye Contact: appropriate Psychomotor Activity: WNL Gait: unable to assess Thought Process: linear, logical, and goal directed and no evidence or endorsement of suicidal, homicidal, and self-harm ideation, plan and intent  Thought Content/Perception: no hallucinations, delusions, bizarre thinking or behavior endorsed or observed Orientation: AAOx4 Memory/Concentration: memory, attention, language, and fund of knowledge intact  Insight: fair Judgment: fair  Interventions:  Conducted a brief chart review Provided empathic reflections and validation Provided positive reinforcement Employed supportive psychotherapy interventions to facilitate reduced distress and to improve coping skills with identified stressors Psychoeducation provided regarding triggers for emotional eating behaviors  DSM-5 Diagnosis(es):  F50.89 Other Specified Feeding or Eating Disorder, Emotional Eating Behaviors  Treatment Goal & Progress: During the initial appointment with this provider, the following treatment goal was established: increase coping skills. Progress is limited, as Cassie Duncan has just begun treatment with this provider; however, she is receptive to the interaction and interventions and rapport is being established.   Plan: The next appointment is scheduled for 07/27/2022 at 2:30pm, which will be via MyChart Video Visit. The next session will focus on working towards the established treatment goal.

## 2022-07-14 ENCOUNTER — Encounter (INDEPENDENT_AMBULATORY_CARE_PROVIDER_SITE_OTHER): Payer: Self-pay

## 2022-07-14 DIAGNOSIS — Z6841 Body Mass Index (BMI) 40.0 and over, adult: Secondary | ICD-10-CM

## 2022-07-14 DIAGNOSIS — R7303 Prediabetes: Secondary | ICD-10-CM

## 2022-07-14 DIAGNOSIS — G4733 Obstructive sleep apnea (adult) (pediatric): Secondary | ICD-10-CM

## 2022-07-14 MED ORDER — TOPIRAMATE 25 MG PO TABS: 25.0000 mg | ORAL_TABLET | Freq: Every day | ORAL | 0 refills | Status: DC

## 2022-07-14 NOTE — Addendum Note (Signed)
Addended by: Glendale Chard on: 07/14/2022 10:27 AM   Modules accepted: Orders

## 2022-07-21 ENCOUNTER — Telehealth (INDEPENDENT_AMBULATORY_CARE_PROVIDER_SITE_OTHER): Payer: Self-pay | Admitting: Internal Medicine

## 2022-07-21 NOTE — Telephone Encounter (Signed)
Spoke to pt, advised can sen in Zepbound and she will need to pay out of pocket and may go to website for coupon. Please send to [place].

## 2022-07-21 NOTE — Telephone Encounter (Signed)
Pt called 07/21/22 about her prescription for Topiramate.She saw Dr Gerarda Fraction on 07/08/22 they had talked about her getting the shot not the pill.She would like to know why he did the pill not the shot? She will pay out of pocket for the shot if not covered by insurance.

## 2022-07-22 ENCOUNTER — Encounter (INDEPENDENT_AMBULATORY_CARE_PROVIDER_SITE_OTHER): Payer: Self-pay

## 2022-07-22 ENCOUNTER — Telehealth (INDEPENDENT_AMBULATORY_CARE_PROVIDER_SITE_OTHER): Payer: Self-pay | Admitting: Internal Medicine

## 2022-07-22 NOTE — Telephone Encounter (Signed)
Patient returned Dr. Michaell Cowing call. Pt stated that when he calls again, call twice as she is a work. Please call patient at number on file asap.

## 2022-07-22 NOTE — Telephone Encounter (Signed)
Spoke to pt, advised can sen in Zepbound and she will need to pay out of pocket and may go to website for coupon. Please send to [place].

## 2022-07-26 ENCOUNTER — Encounter (INDEPENDENT_AMBULATORY_CARE_PROVIDER_SITE_OTHER): Payer: Self-pay

## 2022-07-26 ENCOUNTER — Telehealth (INDEPENDENT_AMBULATORY_CARE_PROVIDER_SITE_OTHER): Payer: Self-pay | Admitting: Internal Medicine

## 2022-07-26 NOTE — Telephone Encounter (Signed)
Pt called 07/26/22  about her prescription for Zepbound.Her last visit her and the doctor talked about. It. She would like for it to be sent to [place]

## 2022-07-27 ENCOUNTER — Other Ambulatory Visit (INDEPENDENT_AMBULATORY_CARE_PROVIDER_SITE_OTHER): Payer: Self-pay | Admitting: Internal Medicine

## 2022-07-27 ENCOUNTER — Telehealth (INDEPENDENT_AMBULATORY_CARE_PROVIDER_SITE_OTHER): Payer: BC Managed Care – PPO | Admitting: Psychology

## 2022-07-27 DIAGNOSIS — F5089 Other specified eating disorder: Secondary | ICD-10-CM | POA: Diagnosis not present

## 2022-07-27 MED ORDER — ZEPBOUND 2.5 MG/0.5ML ~~LOC~~ SOAJ: 2.5000 mg | SUBCUTANEOUS | 0 refills | Status: DC

## 2022-07-27 NOTE — Progress Notes (Signed)
  Office: [phone]  /  Fax: [phone]    Date: July 27, 2022    Appointment Start Time: 2:31pm Duration: 34 minutes Provider: Glennie Isle, Psy.D. Type of Session: Individual Therapy  Location of Patient: Home (private location) Location of Provider: Provider's Home (private office) Type of Contact: Telepsychological Visit via MyChart Video Visit  Session Content: Cassie Duncan is a 37 y.o. female presenting for a follow-up appointment to address the previously established treatment goal of increasing coping skills.Today's appointment was a telepsychological visit. [name] provided verbal consent for today's telepsychological appointment and she is aware she is responsible for securing confidentiality on her end of the session. Prior to proceeding with today's appointment, [name]'s physical location at the time of this appointment was obtained as well a phone number she could be reached at in the event of technical difficulties. [name] and this provider participated in today's telepsychological service.   This provider conducted a brief check-in. [name] reported experiencing stress with work. Briefly explored and processed. She reported continued challenges with her eating habits, especially with recent stressors. Psychoeducation provided regarding self-compassion to assist with coping. [name] was engaged in a self-compassion exercise to help with eating-related challenges and other ongoing stressors. She was encouraged to regularly ask herself, "What do I need right now?" and "How can I comfort and care for myself in this moment?" Overall, [name] was receptive to today's appointment as evidenced by openness to sharing, responsiveness to feedback, and willingness to work toward increasing self-compassion.  Mental Status Examination:  Appearance: neat Behavior: appropriate to circumstances Mood: sad Affect: mood congruent; tearful at times Speech: WNL Eye Contact:  appropriate Psychomotor Activity: WNL Gait: unable to assess Thought Process: linear, logical, and goal directed and no evidence or endorsement of suicidal, homicidal, and self-harm ideation, plan and intent  Thought Content/Perception: no hallucinations, delusions, bizarre thinking or behavior endorsed or observed Orientation: AAOx4 Memory/Concentration: memory, attention, language, and fund of knowledge intact  Insight: fair Judgment: fair  Interventions:  Conducted a brief chart review Provided empathic reflections and validation Employed supportive psychotherapy interventions to facilitate reduced distress and to improve coping skills with identified stressors Psychoeducation provided regarding self-compassion Engaged pt in a self-compassion exercise  DSM-5 Diagnosis(es): F50.89 Other Specified Feeding or Eating Disorder, Emotional Eating Behaviors  Treatment Goal & Progress: During the initial appointment with this provider, the following treatment goal was established: increase coping skills. [name] has demonstrated progress in her goal as evidenced by increased awareness of hunger patterns and increased awareness of triggers for emotional eating behaviors. [name] also continues to demonstrate willingness to engage in learned skill(s).  Plan: The next appointment is scheduled for 08/09/2022 at 9:30am, which will be via MyChart Video Visit. The next session will focus on working towards the established treatment goal. [name] will continue with her primary therapist.

## 2022-07-28 ENCOUNTER — Encounter (INDEPENDENT_AMBULATORY_CARE_PROVIDER_SITE_OTHER): Payer: Self-pay

## 2022-07-29 ENCOUNTER — Ambulatory Visit (INDEPENDENT_AMBULATORY_CARE_PROVIDER_SITE_OTHER): Payer: BC Managed Care – PPO | Admitting: Internal Medicine

## 2022-07-29 ENCOUNTER — Encounter (INDEPENDENT_AMBULATORY_CARE_PROVIDER_SITE_OTHER): Payer: Self-pay | Admitting: Internal Medicine

## 2022-07-29 VITALS — BP 136/82 | HR 78 | Temp 98.6°F | Ht 64.0 in | Wt 293.0 lb

## 2022-07-29 DIAGNOSIS — Z6841 Body Mass Index (BMI) 40.0 and over, adult: Secondary | ICD-10-CM | POA: Diagnosis not present

## 2022-07-29 DIAGNOSIS — F5089 Other specified eating disorder: Secondary | ICD-10-CM | POA: Diagnosis not present

## 2022-07-29 NOTE — Progress Notes (Signed)
Office: [phone]  /  Fax: [phone]  WEIGHT SUMMARY AND BIOMETRICS  Vitals Temp: 98.6 F (37 C) BP: 136/82 Pulse Rate: 78 SpO2: 99 %   Anthropometric Measurements Height: 5\' 4"  (1.626 m) Weight: 293 lb (132.9 kg) BMI (Calculated): 50.27 Weight at Last Visit: 289 lb Weight Gained Since Last Visit: 4 lb Starting Weight: 274 lb Total Weight Loss (lbs): 0 lb (0 kg) Peak Weight: 312 lb   Body Composition  Body Fat %: 56 % Fat Mass (lbs): 164.4 lbs Muscle Mass (lbs): 122.6 lbs Visceral Fat Rating : 19    HPI  Chief Complaint: OBESITY  Cassie Duncan is here to discuss her progress with her obesity treatment plan. She is on the the Very Low Calorie Protein Sparing Modified Fast Plan and states she is following her eating plan approximately 40 % of the time. She states she is exercising 30-60 minutes 20 times per week.   Interval History:  Since last office visit she has gained 4 pounds.  She continues to gain weight.  She has been working with behavioral health specialist and has found sessions helpful. She reports has been tracking and journaling calories and acknowledges exceeding calorie target and some days not meeting protein intake.  Previous review of micronutrients showed high consumption of carbs and added sugars coming from prepackaged foods.  Because of strong hunger signals and difficulty focusing on healthy eating lean had decided to initiate incretin therapy.  She has had a difficult time obtaining medication due to supply chain problems.  She has decided to pay for medication out-of-pocket. [] Denies [x] Reports problems with appetite and hunger signals.  [] Denies [x] Reports problems with satiety and satiation.  [] Denies [x] Reports problems with eating patterns and portion control.  [] Denies [x] Reports abnormal cravings Barriers identified inability to focus on healthy eating, cost of medication, and other: Dissonance between personal goals and nutritional  activities .   Pharmacotherapy for weight loss: She is currently taking Metformin (off label use for incretin effect and / or insulin resistance and / or diabetes prevention) .   Weight promoting medications identified: Beta-blockers.  ASSESSMENT AND PLAN  TREATMENT PLAN FOR OBESITY:  Recommended Dietary Goals  Cassie Duncan is currently in the action stage of change. As such, her goal is to continue weight management plan. She has agreed to: keeping a food journal and adhering to recommended goals of 1200 calories and 90 to 100 g of protein and the Cassie Duncan.  Behavioral Intervention  We discussed the following Behavioral Modification Strategies today: increasing lean protein intake, decreasing simple carbohydrates , increasing vegetables, increasing lower glycemic fruits, increasing water intake, work on tracking and journaling calories using tracking App, reading food labels , decreasing eating out or consumption of processed foods, and making healthy choices when eating convenient foods, avoiding temptations and identifying enticing environmental cues, and planning for success. She will continue to follow-up with Dr. Mallie Mussel for cognitive behavioral therapy.  Additional resources provided today: None  Recommended Physical Activity Goals  Cassie Duncan has been advised to work up to 150 minutes of moderate intensity aerobic activity a week and strengthening exercises 2-3 times per week for cardiovascular health, weight loss maintenance and preservation of muscle mass.   She has agreed to :  []  Continue current level of physical activity  [x]  Think about ways to increase physical activity []  Start strengthening exercises with a goal of 2-3 sessions a week  []  Start aerobic activity with a goal of 150 minutes a week at  moderate intensity.  []  Increase the intensity, frequency or duration of strengthening exercises  []  Increase the intensity, frequency or duration of aerobic exercises   []   Increase physical activity in their day and reduce sedentary time (increase NEAT). []  Work on scheduling and tracking physical activity.   Pharmacotherapy We discussed various medication options to help Cassie Duncan with her weight loss efforts and we both agreed to : In addition to reduced calorie nutrition plan (RCNP), behavioral strategies and physical activity, Cassie Duncan would benefit from pharmacotherapy to assist with hunger signals, satiety and cravings. This will reduce obesity-related health risks by inducing weight loss, and help reduce food consumption and adherence to Cassie Duncan) . It may also improve QOL by improving self-confidence and reduce the  setbacks associated with metabolic adaptations.  After discussion of treatment options, mechanisms of action, benefits, side effects, contraindications and shared decision making she is agreeable to starting Zepbound 2.5 mg once a week. Patient also made aware that medication is indicated for long-term management of obesity and the risk of weight regain following discontinuation of treatment and hence the importance of adhering to medical weight loss plan.  We demonstrated use of device and patient using teach back method was able to demonstrate proper technique.   ASSOCIATED CONDITIONS ADDRESSED TODAY  Class 3 severe obesity without serious comorbidity with body mass index (BMI) of 45.0 to 49.9 in adult, unspecified obesity type (Cassie Duncan)  Other Specified Feeding or Eating Disorder, Emotional Eating Behaviors Assessment & Plan: She is currently following Dr. Mallie Mussel and is benefiting from CBT.  We will continue with CBT.  In addition she benefits from incretin therapy as she does have significant hunger signals which makes it difficult to focus on implementing nutritional changes and healthy eating.       PHYSICAL EXAM:  Blood pressure 136/82, pulse 78, temperature 98.6 F (37 C), height 5\' 4"  (1.626 m), weight 293 lb (132.9 kg), SpO2 99 %. Body mass  index is 50.29 kg/m.  General: She is overweight, cooperative, alert, well developed, and in no acute distress. PSYCH: Has normal mood, affect and thought process.   HEENT: EOMI, sclerae are anicteric. Lungs: Normal breathing effort, no conversational dyspnea. Extremities: No edema.  Neurologic: No gross sensory or motor deficits. No tremors or fasciculations noted.    DIAGNOSTIC DATA REVIEWED:  BMET    Component Value Date/Time   NA 138 03/04/2022 1056   K 4.6 03/04/2022 1056   CL 102 03/04/2022 1056   CO2 23 03/04/2022 1056   GLUCOSE 84 03/04/2022 1056   GLUCOSE 87 09/18/2021 0909   BUN 9 03/04/2022 1056   CREATININE 0.73 03/04/2022 1056   CREATININE 0.79 09/05/2015 1201   CALCIUM 9.5 03/04/2022 1056   GFRNONAA >60 08/31/2021 1821   Lab Results  Component Value Date   HGBA1C 5.4 07/08/2022   HGBA1C 5.8 (H) 02/23/2012   Lab Results  Component Value Date   INSULIN 5.3 02/04/2022   Lab Results  Component Value Date   TSH 1.44 09/02/2021   CBC    Component Value Date/Time   WBC 5.3 03/04/2022 1056   WBC 6.7 09/02/2021 1421   RBC 4.03 03/04/2022 1056   RBC 4.78 09/02/2021 1421   HGB 11.7 03/04/2022 1056   HCT 35.0 03/04/2022 1056   PLT 403.0 (H) 09/02/2021 1421   MCV 87 03/04/2022 1056   MCH 29.0 03/04/2022 1056   MCH 29.4 08/31/2021 1821   MCHC 33.4 03/04/2022 1056   MCHC 33.0 09/02/2021 1421  RDW 12.4 03/04/2022 1056   Iron Studies No results found for: "IRON", "TIBC", "FERRITIN", "IRONPCTSAT" Lipid Panel     Component Value Date/Time   CHOL 161 02/04/2022 0953   TRIG 33 02/04/2022 0953   HDL 47 02/04/2022 0953   CHOLHDL 4 09/02/2021 1421   VLDL 9.4 09/02/2021 1421   LDLCALC 106 (H) 02/04/2022 0953   Hepatic Function Panel     Component Value Date/Time   PROT 7.3 03/04/2022 1056   ALBUMIN 3.9 03/04/2022 1056   AST 16 03/04/2022 1056   ALT 10 03/04/2022 1056   ALKPHOS 65 03/04/2022 1056   BILITOT 0.3 03/04/2022 1056      Component Value  Date/Time   TSH 1.44 09/02/2021 1421   Nutritional Lab Results  Component Value Date   VD25OH 47.3 03/04/2022     Return in about 3 weeks (around 08/19/2022) for For Weight Mangement with Dr. Gerarda Fraction.Marland Kitchen She was informed of the importance of frequent follow up visits to maximize her success with intensive lifestyle modifications for her multiple health conditions.   ATTESTASTION STATEMENTS:  Reviewed by clinician on day of visit: allergies, medications, problem list, medical history, surgical history, family history, social history, and previous encounter notes.   Time spent on visit including pre-visit chart review and post-visit care and charting was 30 minutes.   I have spent 30 minutes in the care of the patient today including: preparing to see patient (e.g. review and interpretation of tests, old notes ), obtaining and/or reviewing separately obtained history, counseling and educating the patient, referring and communicating with other healthcare professionals, documenting clinical information in the electronic or other health care record, and care coordination.  We also spent time counseling on injection and demonstrating use of device.   Thomes Dinning, MD

## 2022-07-30 DIAGNOSIS — F5089 Other specified eating disorder: Secondary | ICD-10-CM | POA: Insufficient documentation

## 2022-07-30 NOTE — Assessment & Plan Note (Addendum)
She is currently following Dr. Mallie Mussel and is benefiting from CBT.  I have reviewed most recent consultation.  We will continue with CBT.  In addition she benefits from incretin therapy as she does have significant hunger signals which makes it difficult to focus on implementing nutritional changes and healthy eating.

## 2022-08-04 ENCOUNTER — Encounter (INDEPENDENT_AMBULATORY_CARE_PROVIDER_SITE_OTHER): Payer: Self-pay | Admitting: Internal Medicine

## 2022-08-09 ENCOUNTER — Telehealth (INDEPENDENT_AMBULATORY_CARE_PROVIDER_SITE_OTHER): Payer: BC Managed Care – PPO | Admitting: Psychology

## 2022-08-09 DIAGNOSIS — F5089 Other specified eating disorder: Secondary | ICD-10-CM

## 2022-08-09 NOTE — Progress Notes (Signed)
  Office: [phone]  /  Fax: [phone]    Date: August 09, 2022    Appointment Start Time: 9:38am Duration: 24 minutes Provider: Lawerance Cruel, Psy.D. Type of Session: Individual Therapy  Location of Patient: Home (private location) Location of Provider: Provider's Home (private office) Type of Contact: Telepsychological Visit via MyChart Video Visit  Session Content: This provider called [name] at 9:33am as she did not present for today's appointment. She stated she thought the appointment was tomorrow, but noted she could join. As such, today's appointment was initiated 8 minutes late. Cassie Duncan is a 37 y.o. female presenting for a follow-up appointment to address the previously established treatment goal of increasing coping skills.Today's appointment was a telepsychological visit. [name] provided verbal consent for today's telepsychological appointment and she is aware she is responsible for securing confidentiality on her end of the session. Prior to proceeding with today's appointment, [name]'s physical location at the time of this appointment was obtained as well a phone number she could be reached at in the event of technical difficulties. [name] and this provider participated in today's telepsychological service. Of note, today's appointment was switched to a regular telephone call at 9:46am with [name]'s verbal consent due to technical issues.   This provider conducted a brief check-in. [name] shared, "Things have been a little calmer." Discussed recent eating habits. She noted an improvement, especially with protein intake. During her last appointment with [name], journaling was discussed. She acknowledged "documenting" her "food intake" has been challenging. Further explored and processed. Reviewed all or nothing thinking. Psychoeducation regarding SMART goals was provided and [name] was engaged in goal setting. The following goal was established: [name] will journal all  food intake using [employer] at least 3 of 7 days a week between now and the next appointment with this provider. Overall, [name] was receptive to today's appointment as evidenced by openness to sharing, responsiveness to feedback, and willingness to work toward the established SMART goal.  Mental Status Examination:  Appearance: neat Behavior: appropriate to circumstances Mood: neutral Affect: mood congruent Speech: WNL Eye Contact: appropriate Psychomotor Activity: WNL Gait: unable to assess Thought Process: linear, logical, and goal directed and no evidence or endorsement of suicidal, homicidal, and self-harm ideation, plan and intent  Thought Content/Perception: no hallucinations, delusions, bizarre thinking or behavior endorsed or observed Orientation: AAOx4 Memory/Concentration: memory, attention, language, and fund of knowledge intact  Insight: fair Judgment: fair  Interventions:  Conducted a brief chart review Provided empathic reflections and validation Employed supportive psychotherapy interventions to facilitate reduced distress and to improve coping skills with identified stressors Engaged patient in goal setting Psychoeducation provided regarding SMART goals  DSM-5 Diagnosis(es): F50.89 Other Specified Feeding or Eating Disorder, Emotional Eating Behaviors  Treatment Goal & Progress: During the initial appointment with this provider, the following treatment goal was established: increase coping skills. [name] has demonstrated progress in her goal as evidenced by increased awareness of hunger patterns and increased awareness of triggers for emotional eating behaviors. [name] also continues to demonstrate willingness to engage in learned skill(s).  Plan: The next appointment is scheduled for 08/23/2022 at 12pm, which will be via MyChart Video Visit. The next session will focus on working towards the established treatment goal.

## 2022-08-18 ENCOUNTER — Telehealth (INDEPENDENT_AMBULATORY_CARE_PROVIDER_SITE_OTHER): Payer: Self-pay | Admitting: Internal Medicine

## 2022-08-18 ENCOUNTER — Other Ambulatory Visit (INDEPENDENT_AMBULATORY_CARE_PROVIDER_SITE_OTHER): Payer: Self-pay | Admitting: Internal Medicine

## 2022-08-18 MED ORDER — ZEPBOUND 5 MG/0.5ML ~~LOC~~ SOAJ: 5.0000 mg | SUBCUTANEOUS | 0 refills | Status: DC

## 2022-08-18 NOTE — Telephone Encounter (Signed)
Patient wants a refill of Zepbound sent to her pharmacy today. Pt states that she is out, but she needs to take the medication before her appt next Monday 08/23/2022. Please call the patient today at the number on file.

## 2022-08-19 ENCOUNTER — Telehealth (INDEPENDENT_AMBULATORY_CARE_PROVIDER_SITE_OTHER): Payer: Self-pay

## 2022-08-19 ENCOUNTER — Encounter (INDEPENDENT_AMBULATORY_CARE_PROVIDER_SITE_OTHER): Payer: Self-pay

## 2022-08-19 ENCOUNTER — Other Ambulatory Visit (INDEPENDENT_AMBULATORY_CARE_PROVIDER_SITE_OTHER): Payer: Self-pay

## 2022-08-19 MED ORDER — ZEPBOUND 5 MG/0.5ML ~~LOC~~ SOAJ: 5.0000 mg | SUBCUTANEOUS | 0 refills | Status: DC

## 2022-08-19 NOTE — Telephone Encounter (Signed)
Pt advised medication sent to [facility] [address] [place]

## 2022-08-23 ENCOUNTER — Encounter (INDEPENDENT_AMBULATORY_CARE_PROVIDER_SITE_OTHER): Payer: Self-pay | Admitting: Internal Medicine

## 2022-08-23 ENCOUNTER — Telehealth (INDEPENDENT_AMBULATORY_CARE_PROVIDER_SITE_OTHER): Payer: BC Managed Care – PPO | Admitting: Psychology

## 2022-08-23 ENCOUNTER — Ambulatory Visit (INDEPENDENT_AMBULATORY_CARE_PROVIDER_SITE_OTHER): Payer: BC Managed Care – PPO | Admitting: Internal Medicine

## 2022-08-23 VITALS — BP 134/82 | HR 84 | Temp 97.9°F | Ht 64.0 in | Wt 290.0 lb

## 2022-08-23 DIAGNOSIS — R5383 Other fatigue: Secondary | ICD-10-CM

## 2022-08-23 DIAGNOSIS — F5089 Other specified eating disorder: Secondary | ICD-10-CM

## 2022-08-23 DIAGNOSIS — Z6841 Body Mass Index (BMI) 40.0 and over, adult: Secondary | ICD-10-CM

## 2022-08-23 DIAGNOSIS — R7303 Prediabetes: Secondary | ICD-10-CM | POA: Diagnosis not present

## 2022-08-23 DIAGNOSIS — R638 Other symptoms and signs concerning food and fluid intake: Secondary | ICD-10-CM | POA: Diagnosis not present

## 2022-08-23 DIAGNOSIS — I10 Essential (primary) hypertension: Secondary | ICD-10-CM

## 2022-08-23 NOTE — Assessment & Plan Note (Signed)
Multifactorial.  She was advised to journal sleep.  I do not think she is getting adequate sleep.  She is also to increase fluid intake.  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

## 2022-08-23 NOTE — Assessment & Plan Note (Signed)
Neurohormonal and psychological.  She is currently working with Dr. Dewaine Conger for cognitive behavioral therapy.  Prescribed Zepbound 2.5 mg once a week.  She will be starting medication in the next 2 weeks.

## 2022-08-23 NOTE — Assessment & Plan Note (Addendum)
Her blood pressure slightly above target of less than 120/80.  On amlodipine and labetalol.  Continue current regimen.  Monitor for orthostasis while losing weight.  Losing 10% of body weight may improve blood pressure control.

## 2022-08-23 NOTE — Progress Notes (Signed)
  Office: [phone]  /  [phone]    Date: August 23, 2022    Appointment Start Time: 12:04pm Duration: 29 minutes Provider: Lawerance Cruel, Psy.D. Type of Session: Individual Therapy  Location of Patient: Home (private location) Location of Provider: Provider's Home (private office) Type of Contact: Telepsychological Visit via MyChart Video Visit  Session Content: This provider called Cassie Duncan at 12:03pm as she did not present for today's appointment. She stated she was joining. As such, today's appointment was initiated 4 minutes late.Cassie Duncan is a 37 y.o. female presenting for a follow-up appointment to address the previously established treatment goal of increasing coping skills.Today's appointment was a telepsychological visit. Cassie Duncan provided verbal consent for today's telepsychological appointment and she is aware she is responsible for securing confidentiality on her end of the session. Prior to proceeding with today's appointment, Cassie Duncan's physical location at the time of this appointment was obtained as well a phone number she could be reached at in the event of technical difficulties. Cassie Duncan and this provider participated in today's telepsychological service.   This provider conducted a brief check-in. Cassie Duncan reported,"It's been okay." She shared about her appointment with [name]. Cassie Duncan acknowledged "over working" resulting in missed meals. Reviewed previously established SMART goal. She acknowledged, "I forgot about that." Further explored and processed. She agreed to work toward the goal between now and the next appointment with this provider. Briefly reviewed the importance of self-care. Moreover, Cassie Duncan reported she is going to [place]. As such, psychoeducation regarding making better choices and engaging in portion control during the holidays/celebrations/vacations was provided. More specifically, this provider discussed the following strategies: coming to meals hungry,  but not starving; avoid filling up on appetizers; managing portion sizes; not completely depriving yourself; making the plate colorful (e.g., vegetables); pacing yourself (e.g., waiting 10 minutes before going back for seconds); taking advantage of the nutritious foods; practicing mindfulness; staying hydrated; and avoid bringing home leftovers. Overall, Cassie Duncan was receptive to today's appointment as evidenced by openness to sharing, responsiveness to feedback, and willingness to implement discussed strategies .  Mental Status Examination:  Appearance: neat Behavior: appropriate to circumstances Mood: neutral Affect: mood congruent Speech: WNL Eye Contact: appropriate Psychomotor Activity: WNL Gait: unable to assess Thought Process: linear, logical, and goal directed and no evidence or endorsement of suicidal, homicidal, and self-harm ideation, plan and intent  Thought Content/Perception: no hallucinations, delusions, bizarre thinking or behavior endorsed or observed Orientation: AAOx4 Memory/Concentration: memory, attention, language, and fund of knowledge intact  Insight: fair Judgment: fair  Interventions:  Conducted a brief chart review Provided empathic reflections and validation Reviewed content from the previous session Employed supportive psychotherapy interventions to facilitate reduced distress and to improve coping skills with identified stressors Psychoeducation provided regarding strategies for celebrations/holidays/vacations  DSM-5 Diagnosis(es): F50.89 Other Specified Feeding or Eating Disorder, Emotional Eating Behaviors  Treatment Goal & Progress: During the initial appointment with this provider, the following treatment goal was established: increase coping skills. Cassie Duncan has demonstrated progress in her goal as evidenced by increased awareness of hunger patterns and increased awareness of triggers for emotional eating behaviors. Cassie Duncan also continues to demonstrate  willingness to engage in learned skill(s).  Plan: The next appointment is scheduled for 09/06/2022 at 2:30pm, which will be via MyChart Video Visit. The next session will focus on working towards the established treatment goal.

## 2022-08-23 NOTE — Progress Notes (Signed)
Office: [phone]  /  Fax: [phone]  WEIGHT SUMMARY AND BIOMETRICS  Vitals Temp: 97.9 F (36.6 C) BP: 134/82 Pulse Rate: 84 SpO2: 99 %   Anthropometric Measurements Height:  (1.626 m) Weight: 290 lb (131.5 kg) BMI (Calculated): 49.75 Weight at Last Visit: 293 lb Weight Lost Since Last Visit: lb Starting Weight: 247 lb Total Weight Loss (lbs): 3 lb (1.361 kg) Peak Weight: 312 lb   Body Composition  Body Fat %: 52.5 % Fat Mass (lbs): 152.4 lbs Muscle Mass (lbs): 131 lbs Total Body Water (lbs): 99 lbs Visceral Fat Rating : 18    No data recorded Today's Visit #: 9  Starting Date: 02/04/22   HPI  Chief Complaint: OBESITY  Cassie Duncan is here to discuss her progress with her obesity treatment plan. She is on the the [facility] and states she is following her eating plan approximately 50 % of the time. She states she is exercising 30-45 minutes 3-4 times per week.  Interval History:  Since last office visit she has lost 3 lbs. She reports fair adherence to reduced calorie nutritional plan. She has been working on not skipping meals, increasing protein intake at every meal, eating more vegetables, drinking more water, avoiding and or reducing liquid calories, making healthier choices, begun to exercise, eating out less, working on meal prepping, and reducing portion sizes Reports problems with appetite and hunger signals.  Denies problems with satiety and satiation.  Denies problems with eating patterns and portion control.  Reports abnormal cravings. Denies feeling deprived or restricted.   Barriers identified:  Inadequate sleep .   Pharmacotherapy for weight loss: She is currently taking Zepbound and has not started medication yet as we are trying to avoid interruption in treatment and will be starting medication in the next 1 to 2 weeks   ASSESSMENT AND PLAN  TREATMENT PLAN FOR OBESITY:  Recommended Dietary Goals  Cassie Duncan is currently in  the action stage of change. As such, her goal is to continue weight management plan. She has agreed to: continue current plan  Behavioral Intervention  We discussed the following Behavioral Modification Strategies today: increasing lean protein intake, decreasing simple carbohydrates , increasing vegetables, increasing lower glycemic fruits, increasing water intake, avoiding temptations and identifying enticing environmental cues, continue to practice mindfulness when eating, planning for success, and celebration eating strategies.  Additional resources provided today: None  Recommended Physical Activity Goals  Cassie Duncan has been advised to work up to 150 minutes of moderate intensity aerobic activity a week and strengthening exercises 2-3 times per week for cardiovascular health, weight loss maintenance and preservation of muscle mass.   She has agreed to :  Think about ways to increase physical activity  Pharmacotherapy We discussed various medication options to help Cassie Duncan with her weight loss efforts and we both agreed to : continue current anti-obesity medication regimen.   ASSOCIATED CONDITIONS ADDRESSED TODAY  Prediabetes Assessment & Plan: Most recent A1c is  Lab Results  Component Value Date   HGBA1C 5.4 07/08/2022   HGBA1C 5.8 (H) 02/23/2012   Which has improved.  She will continue on Zepbound for pharmacoprophylaxis.  Medication will be started in the next 2 weeks.    Class 3 severe obesity without serious comorbidity with body mass index (BMI) of 45.0 to 49.9 in adult, unspecified obesity type  Hypertension, essential Assessment & Plan: Her blood pressure slightly above target of less than 120/80.  On amlodipine and labetalol.  Continue current regimen.  Monitor for  orthostasis while losing weight.  Losing 10% of body weight may improve blood pressure control.   Abnormal craving Assessment & Plan: Neurohormonal and psychological.  She is currently working with Dr.  Dewaine Conger for cognitive behavioral therapy.  Prescribed Zepbound 2.5 mg once a week.  She will be starting medication in the next 2 weeks.   Other fatigue Assessment & Plan: Multifactorial.  She was advised to journal sleep.  I do not think she is getting adequate sleep.  She is also to increase fluid intake.  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      PHYSICAL EXAM:  Blood pressure 134/82, pulse 84, temperature 97.9 F (36.6 C), height 5\' 4"  (1.626 m), weight 290 lb (131.5 kg), SpO2 99 %. Body mass index is 49.78 kg/m.  General: She is overweight, cooperative, alert, well developed, and in no acute distress. PSYCH: Has normal mood, affect and thought process.   HEENT: EOMI, sclerae are anicteric. Lungs: Normal breathing effort, no conversational dyspnea. Extremities: No edema.  Neurologic: No gross sensory or motor deficits. No tremors or fasciculations noted.    DIAGNOSTIC DATA REVIEWED:  BMET    Component Value Date/Time   NA 138 03/04/2022 1056   K 4.6 03/04/2022 1056   CL 102 03/04/2022 1056   CO2 23 03/04/2022 1056   GLUCOSE 84 03/04/2022 1056   GLUCOSE 87 09/18/2021 0909   BUN 9 03/04/2022 1056   CREATININE 0.73 03/04/2022 1056   CREATININE 0.79 09/05/2015 1201   CALCIUM 9.5 03/04/2022 1056   GFRNONAA >60 08/31/2021 1821   Lab Results  Component Value Date   HGBA1C 5.4 07/08/2022   HGBA1C 5.8 (H) 02/23/2012   Lab Results  Component Value Date   INSULIN 5.3 02/04/2022   Lab Results  Component Value Date   TSH 1.44 09/02/2021   CBC    Component Value Date/Time   WBC 5.3 03/04/2022 1056   WBC 6.7 09/02/2021 1421   RBC 4.03 03/04/2022 1056   RBC 4.78 09/02/2021 1421   HGB 11.7 03/04/2022 1056   HCT 35.0 03/04/2022 1056   PLT 403.0 (H) 09/02/2021 1421   MCV 87 03/04/2022 1056   MCH 29.0 03/04/2022 1056   MCH 29.4 08/31/2021 1821   MCHC 33.4 03/04/2022 1056   MCHC 33.0 09/02/2021 1421   RDW 12.4  03/04/2022 1056   Iron Studies No results found for: "IRON", "TIBC", "FERRITIN", "IRONPCTSAT" Lipid Panel     Component Value Date/Time   CHOL 161 02/04/2022 0953   TRIG 33 02/04/2022 0953   HDL 47 02/04/2022 0953   CHOLHDL 4 09/02/2021 1421   VLDL 9.4 09/02/2021 1421   LDLCALC 106 (H) 02/04/2022 0953   Hepatic Function Panel     Component Value Date/Time   PROT 7.3 03/04/2022 1056   ALBUMIN 3.9 03/04/2022 1056   AST 16 03/04/2022 1056   ALT 10 03/04/2022 1056   ALKPHOS 65 03/04/2022 1056   BILITOT 0.3 03/04/2022 1056      Component Value Date/Time   TSH 1.44 09/02/2021 1421   Nutritional Lab Results  Component Value Date   VD25OH 47.3 03/04/2022     Return in about 4 weeks (around 09/20/2022) for For Weight Mangement with [name].Marland Kitchen She was informed of the importance of frequent follow up visits to maximize her success with intensive lifestyle modifications for her multiple health conditions.   ATTESTASTION STATEMENTS:  Reviewed by clinician on day of visit: allergies,  medications, problem list, medical history, surgical history, family history, social history, and previous encounter notes.     Worthy Rancher, MD

## 2022-08-23 NOTE — Assessment & Plan Note (Signed)
Most recent A1c is  Lab Results  Component Value Date   HGBA1C 5.4 07/08/2022   HGBA1C 5.8 (H) 02/23/2012   Which has improved.  She will continue on Zepbound for pharmacoprophylaxis.  Medication will be started in the next 2 weeks.

## 2022-09-06 ENCOUNTER — Telehealth (INDEPENDENT_AMBULATORY_CARE_PROVIDER_SITE_OTHER): Payer: BC Managed Care – PPO | Admitting: Psychology

## 2022-09-06 DIAGNOSIS — F5089 Other specified eating disorder: Secondary | ICD-10-CM

## 2022-09-06 NOTE — Progress Notes (Signed)
  Office: [phone]  /  Fax: [phone]    Date: Sep 06, 2022    Appointment Start Time: 2:31pm Duration: 33 minutes Provider: Lawerance Cruel, Psy.D. Type of Session: Individual Therapy  Location of Patient: Home (private location) Location of Provider: Provider's Home (private office) Type of Contact: Telepsychological Visit via MyChart Video Visit  Session Content: Cassie Duncan is a 37 y.o. female presenting for a follow-up appointment to address the previously established treatment goal of increasing coping skills.Today's appointment was a telepsychological visit. [name] provided verbal consent for today's telepsychological appointment and she is aware she is responsible for securing confidentiality on her end of the session. Prior to proceeding with today's appointment, Jocee's physical location at the time of this appointment was obtained as well a phone number she could be reached at in the event of technical difficulties. Zivah and this provider participated in today's telepsychological service.   This provider conducted a brief check-in. Icel shared about her recent trip to [place]. Discussed what went well and what she would do differently in the future when traveling. Since returning, she discussed "not doing the best" with her eating habits and stated she gained weight. Remainder of today's appointment focused on building discrepancy by exploring what would happen in different areas of her life if she were to continue eating the way she is and what would happen if she were to re-commit to her lifestyle change. Overall, [name] was receptive to today's appointment as evidenced by openness to sharing, responsiveness to feedback, and willingness to implement discussed strategies .  Mental Status Examination:  Appearance: neat Behavior: appropriate to circumstances Mood: neutral Affect: mood congruent Speech: WNL Eye Contact: appropriate Psychomotor Activity: WNL Gait: unable  to assess Thought Process: linear, logical, and goal directed and no evidence or endorsement of suicidal, homicidal, and self-harm ideation, plan and intent  Thought Content/Perception: no hallucinations, delusions, bizarre thinking or behavior endorsed or observed Orientation: AAOx4 Memory/Concentration: memory, attention, language, and fund of knowledge intact  Insight: fair Judgment: fair  Interventions:  Conducted a brief chart review Provided empathic reflections and validation Provided positive reinforcement Employed supportive psychotherapy interventions to facilitate reduced distress and to improve coping skills with identified stressors Employed motivational interviewing skills to assess patient's willingness/desire to adhere to recommended medical treatments and assignments  DSM-5 Diagnosis(es): F50.89 Other Specified Feeding or Eating Disorder, Emotional Eating Behaviors  Treatment Goal & Progress: During the initial appointment with this provider, the following treatment goal was established: increase coping skills. [name] has demonstrated progress in her goal as evidenced by increased awareness of hunger patterns and increased awareness of triggers for emotional eating behaviors. [name] also continues to demonstrate willingness to engage in learned skill(s).  Plan: Based on appointment availability and [name]'s schedule, the next appointment is scheduled for 10/05/2022 at 10am, which will be via MyChart Video Visit. The next session will focus on working towards the established treatment goal. Per [name]'s request, referrals for therapeutic services will be sent via e-mail.

## 2022-09-07 DIAGNOSIS — G4733 Obstructive sleep apnea (adult) (pediatric): Secondary | ICD-10-CM | POA: Diagnosis not present

## 2022-09-08 ENCOUNTER — Other Ambulatory Visit: Payer: Self-pay

## 2022-09-08 MED ORDER — TIRZEPATIDE-WEIGHT MANAGEMENT 5 MG/0.5ML ~~LOC~~ SOAJ
5.0000 mg | SUBCUTANEOUS | 0 refills | Status: DC
  Filled 2022-09-08: qty 2, 28d supply, fill #0

## 2022-09-13 ENCOUNTER — Other Ambulatory Visit: Payer: Self-pay

## 2022-09-14 ENCOUNTER — Other Ambulatory Visit: Payer: Self-pay

## 2022-09-23 ENCOUNTER — Ambulatory Visit (INDEPENDENT_AMBULATORY_CARE_PROVIDER_SITE_OTHER): Payer: BC Managed Care – PPO | Admitting: Internal Medicine

## 2022-09-23 ENCOUNTER — Encounter (INDEPENDENT_AMBULATORY_CARE_PROVIDER_SITE_OTHER): Payer: Self-pay | Admitting: Internal Medicine

## 2022-09-23 ENCOUNTER — Other Ambulatory Visit (HOSPITAL_COMMUNITY): Payer: Self-pay

## 2022-09-23 VITALS — BP 132/84 | HR 90 | Temp 98.3°F | Ht 64.0 in | Wt 297.0 lb

## 2022-09-23 DIAGNOSIS — Z6841 Body Mass Index (BMI) 40.0 and over, adult: Secondary | ICD-10-CM

## 2022-09-23 DIAGNOSIS — F5089 Other specified eating disorder: Secondary | ICD-10-CM

## 2022-09-23 DIAGNOSIS — R7303 Prediabetes: Secondary | ICD-10-CM | POA: Diagnosis not present

## 2022-09-23 MED ORDER — ZEPBOUND 5 MG/0.5ML ~~LOC~~ SOAJ
5.0000 mg | SUBCUTANEOUS | 0 refills | Status: DC
  Filled 2022-09-23 – 2022-10-25 (×2): qty 2, 28d supply, fill #0

## 2022-09-23 NOTE — Progress Notes (Signed)
Office: [phone]  /  Fax: [phone]  WEIGHT SUMMARY AND BIOMETRICS  Vitals Temp: 98.3 F (36.8 C) BP: 132/84 Pulse Rate: 90 SpO2: 100 %   Anthropometric Measurements Height: 5\' 4"  (1.626 m) Weight: 297 lb (134.7 kg) BMI (Calculated): 50.95 Weight at Last Visit: 290 lb Weight Lost Since Last Visit: 0 Weight Gained Since Last Visit: 7 lb Starting Weight: 247 lb Total Weight Loss (lbs): 0 lb (0 kg) Peak Weight: 312 lb   Body Composition  Body Fat %: 53.6 % Fat Mass (lbs): 159.2 lbs Muscle Mass (lbs): 130.8 lbs Total Body Water (lbs): 102.6 lbs Visceral Fat Rating : 18    No data recorded Today's Visit #: 10  Starting Date: 02/04/22   HPI  Chief Complaint: OBESITY  Cassie Duncan is here to discuss her progress with her obesity treatment plan. She is on the the [facility] and states she is following her eating plan approximately 70 % of the time. She states she is exercising at the gym and walking 30-45 minutes 3-5 times per week.  Interval History:  Since last office visit she has []  lost []  maintained [x] gained weight.  She is currently working with Dr. Dewaine Conger for emotional eating behaviors.  She has not started treatment with tirzepatide yet, she is trying to avoid disruption in treatment due to supply chain matters.  She is been paying for medication out-of-pocket.  She brought in injections today to do her first shot here in the office with my guidance.  Adherence to nutrition plan :  []  Excellent []  Good [x]  Fair []  Suboptimal []  Variable []  Gradual implementation [] Has not started implementation  Nutritional: She has been working on  working with behavioral health specialist.  Orexigenic Control: [] Denies [x] Reports problems with appetite and hunger signals.  [] Denies [x] Reports problems with satiety and satiation.  [] Denies [x] Reports problems with eating patterns and portion control.  [] Denies [x] Reports strong cravings for highly palatable  foods [x] Denies [] Reports problems with feeling restricted or deprived    Barriers identified: strong hunger signals and appetite, having difficulty preparing healthy meals, having difficulty focusing on healthy eating, exposure to enticing environments and or relationships, predilection for convenience or prepackaged foods, and work schedule.   Pharmacotherapy for weight loss: She is currently taking Zepbound 2.5 mg once a week first dose administered today here in the office.   ASSESSMENT AND PLAN  TREATMENT PLAN FOR OBESITY:  Recommended Dietary Goals  Cassie Duncan is currently in the action stage of change. As such, her goal is to continue weight management plan. She has agreed to: portion control, balanced plate and making smarter food choices, such as increasing vegetables, protein intake and reducing simple carbohydrates and processed foods   Behavioral Intervention  We discussed the following Behavioral Modification Strategies today: increasing lean protein intake, decreasing simple carbohydrates , increasing vegetables, increasing lower glycemic fruits, increasing fiber rich foods, avoiding skipping meals, increasing water intake, reading food labels , keeping healthy foods at home, identifying sources and decreasing liquid calories, emotional eating strategies and understanding the difference between hunger signals and cravings, avoiding temptations and identifying enticing environmental cues, planning for success, and continue to work with behavioral health specialist. .  Additional resources provided today: None  Recommended Physical Activity Goals  Cassie Duncan has been advised to work up to 150 minutes of moderate intensity aerobic activity a week and strengthening exercises 2-3 times per week for cardiovascular health, weight loss maintenance and preservation of muscle mass.   She has agreed to :  She will work on reestablishing relationship with physical trainer to resume exercise  program.  Pharmacotherapy We have discussed various medication options to help Cassie Duncan with her weight loss efforts and we both agreed to : Continue tirzepatide 2.5 mg once a week.  This patient has significant challenges pertaining to hunger signals and impaired satiety and benefits from incretin therapy.  ASSOCIATED CONDITIONS ADDRESSED TODAY  Class 3 severe obesity without serious comorbidity with body mass index (BMI) of 45.0 to 49.9 in adult, unspecified obesity type (HCC) -     Zepbound; Inject 5 mg into the skin once a week.  Dispense: 2 mL; Refill: 0  Prediabetes Assessment & Plan: Most recent A1c is  Lab Results  Component Value Date   HGBA1C 5.4 07/08/2022   HGBA1C 5.8 (H) 02/23/2012   Which has improved.  She will continue on Zepbound for pharmacoprophylaxis.      Other Specified Feeding or Eating Disorder, Emotional Eating Behaviors Assessment & Plan: She is currently following Dr. Dewaine Conger and is benefiting from CBT.  I have reviewed most recent consultation.  We will continue with CBT.  In addition she benefits from incretin therapy as she does have significant hunger signals which makes it difficult to focus on implementing nutritional changes and healthy eating.     PHYSICAL EXAM:  Blood pressure 132/84, pulse 90, temperature 98.3 F (36.8 C), height 5\' 4"  (1.626 m), weight 297 lb (134.7 kg), SpO2 100 %. Body mass index is 50.98 kg/m.  General: She is overweight, cooperative, alert, well developed, and in no acute distress. PSYCH: Has normal mood, affect and thought process.   HEENT: EOMI, sclerae are anicteric. Lungs: Normal breathing effort, no conversational dyspnea. Extremities: No edema.  Neurologic: No gross sensory or motor deficits. No tremors or fasciculations noted.    DIAGNOSTIC DATA REVIEWED:  BMET    Component Value Date/Time   NA 138 03/04/2022 1056   K 4.6 03/04/2022 1056   CL 102 03/04/2022 1056   CO2 23 03/04/2022 1056   GLUCOSE 84  03/04/2022 1056   GLUCOSE 87 09/18/2021 0909   BUN 9 03/04/2022 1056   CREATININE 0.73 03/04/2022 1056   CREATININE 0.79 09/05/2015 1201   CALCIUM 9.5 03/04/2022 1056   GFRNONAA >60 08/31/2021 1821   EGFR 109 03/04/2022 1056   Lab Results  Component Value Date   HGBA1C 5.4 07/08/2022   HGBA1C 5.8 (H) 02/23/2012   Lab Results  Component Value Date   INSULIN 5.3 02/04/2022   Lab Results  Component Value Date   TSH 1.44 09/02/2021   CBC    Component Value Date/Time   WBC 5.3 03/04/2022 1056   WBC 6.7 09/02/2021 1421   RBC 4.03 03/04/2022 1056   RBC 4.78 09/02/2021 1421   HGB 11.7 03/04/2022 1056   HCT 35.0 03/04/2022 1056   PLT 403.0 (H) 09/02/2021 1421   MCV 87 03/04/2022 1056   MCH 29.0 03/04/2022 1056   MCH 29.4 08/31/2021 1821   MCHC 33.4 03/04/2022 1056   MCHC 33.0 09/02/2021 1421   RDW 12.4 03/04/2022 1056   Iron Studies No results found for: "IRON", "TIBC", "FERRITIN", "IRONPCTSAT" Lipid Panel     Component Value Date/Time   CHOL 161 02/04/2022 0953   TRIG 33 02/04/2022 0953   HDL 47 02/04/2022 0953   CHOLHDL 4 09/02/2021 1421   VLDL 9.4 09/02/2021 1421   LDLCALC 106 (H) 02/04/2022 0953   Hepatic Function Panel     Component Value Date/Time   PROT  7.3 03/04/2022 1056   ALBUMIN 3.9 03/04/2022 1056   AST 16 03/04/2022 1056   ALT 10 03/04/2022 1056   ALKPHOS 65 03/04/2022 1056   BILITOT 0.3 03/04/2022 1056      Component Value Date/Time   TSH 1.44 09/02/2021 1421   Nutritional Lab Results  Component Value Date   VD25OH 47.3 03/04/2022     Return in about 4 weeks (around 10/21/2022) for For Weight Mangement with [name].Marland Kitchen She was informed of the importance of frequent follow up visits to maximize her success with intensive lifestyle modifications for her multiple health conditions.   ATTESTASTION STATEMENTS:  Reviewed by clinician on day of visit: allergies, medications, problem list, medical history, surgical history, family history,  social history, and previous encounter notes.     Worthy Rancher, MD

## 2022-09-23 NOTE — Assessment & Plan Note (Signed)
Most recent A1c is  Lab Results  Component Value Date   HGBA1C 5.4 07/08/2022   HGBA1C 5.8 (H) 02/23/2012   Which has improved.  She will continue on Zepbound for pharmacoprophylaxis.

## 2022-09-23 NOTE — Progress Notes (Deleted)
Office: [phone]  /  Fax: [phone]  WEIGHT SUMMARY AND BIOMETRICS  No data recorded No data recorded No data recorded  No data recorded No data recorded No data recorded  HPI  Chief Complaint: OBESITY  Cassie Duncan is here to discuss her progress with her obesity treatment plan. She is on the keeping a food journal and adhering to recommended goals of 1000 calories and 90-100 protein daily and states she is following her eating plan approximately 75 % of the time. She states she is exercising 45 minutes 3-4 times per week.  Interval History:  Since last office visit she has []  lost []  maintained [] gained weight.  Adherence to nutrition plan :  []  Excellent []  Good []  Fair []  Suboptimal []  Variable []  Gradual implementation [] Has not started implementation  Nutritional: She has been working on [employer] Control: [] Denies [] Reports problems with appetite and hunger signals.  [] Denies [] Reports problems with satiety and satiation.  [] Denies [] Reports problems with eating patterns and portion control.  [] Denies [] Reports strong cravings for highly palatable foods [] Denies [] Reports problems with feeling restricted or deprived  Stress levels: []  Low [] Medium [] High   Barriers identified: {EMOBESITYBARRIERS:28841::"none"}.   Pharmacotherapy for weight loss: She is currently taking {EMPharmaco:28845::"no anti-obesity medication"}.    ASSESSMENT AND PLAN  TREATMENT PLAN FOR OBESITY:  Recommended Dietary Goals  Cassie Duncan is currently in the action stage of change. As such, her goal is to continue weight management plan. She has agreed to: {EMWTLOSSPLAN:29297::"continue current plan"}  Behavioral Intervention  We discussed the following Behavioral Modification Strategies today: {EMWMwtlossstrategies:28914::"increasing lean protein intake","decreasing simple carbohydrates ","increasing vegetables","increasing lower glycemic fruits","increasing  water intake","continue to practice mindfulness when eating","planning for success"}.  Additional resources provided today: {EMadditionalresources:29169::"None"}  Recommended Physical Activity Goals  Cassie Duncan has been advised to work up to 150 minutes of moderate intensity aerobic activity a week and strengthening exercises 2-3 times per week for cardiovascular health, weight loss maintenance and preservation of muscle mass.   She has agreed to :  {EMEXERCISE:28847::"Think about ways to increase daily physical activity and overcoming barriers to exercise"}  Pharmacotherapy We have discussed various medication options to help Cassie Duncan with her weight loss efforts and we both agreed to : {EMagreedrx:29170::"continue with nutritional and behavioral strategies"}  ASSOCIATED CONDITIONS ADDRESSED TODAY  Class 3 severe obesity without serious comorbidity with body mass index (BMI) of 45.0 to 49.9 in adult, unspecified obesity type (HCC)  Prediabetes  Other Specified Feeding or Eating Disorder, Emotional Eating Behaviors    PHYSICAL EXAM:  There were no vitals taken for this visit. There is no height or weight on file to calculate BMI.  General: She is overweight, cooperative, alert, well developed, and in no acute distress. PSYCH: Has normal mood, affect and thought process.   HEENT: EOMI, sclerae are anicteric. Lungs: Normal breathing effort, no conversational dyspnea. Extremities: No edema.  Neurologic: No gross sensory or motor deficits. No tremors or fasciculations noted.    DIAGNOSTIC DATA REVIEWED:  BMET    Component Value Date/Time   NA 138 03/04/2022 1056   K 4.6 03/04/2022 1056   CL 102 03/04/2022 1056   CO2 23 03/04/2022 1056   GLUCOSE 84 03/04/2022 1056   GLUCOSE 87 09/18/2021 0909   BUN 9 03/04/2022 1056   CREATININE 0.73 03/04/2022 1056   CREATININE 0.79 09/05/2015 1201   CALCIUM 9.5 03/04/2022 1056   GFRNONAA >60 08/31/2021 1821   EGFR 109 03/04/2022 1056    Lab Results  Component Value Date  HGBA1C 5.4 07/08/2022   HGBA1C 5.8 (H) 02/23/2012   Lab Results  Component Value Date   INSULIN 5.3 02/04/2022   Lab Results  Component Value Date   TSH 1.44 09/02/2021   CBC    Component Value Date/Time   WBC 5.3 03/04/2022 1056   WBC 6.7 09/02/2021 1421   RBC 4.03 03/04/2022 1056   RBC 4.78 09/02/2021 1421   HGB 11.7 03/04/2022 1056   HCT 35.0 03/04/2022 1056   PLT 403.0 (H) 09/02/2021 1421   MCV 87 03/04/2022 1056   MCH 29.0 03/04/2022 1056   MCH 29.4 08/31/2021 1821   MCHC 33.4 03/04/2022 1056   MCHC 33.0 09/02/2021 1421   RDW 12.4 03/04/2022 1056   Iron Studies No results found for: "IRON", "TIBC", "FERRITIN", "IRONPCTSAT" Lipid Panel     Component Value Date/Time   CHOL 161 02/04/2022 0953   TRIG 33 02/04/2022 0953   HDL 47 02/04/2022 0953   CHOLHDL 4 09/02/2021 1421   VLDL 9.4 09/02/2021 1421   LDLCALC 106 (H) 02/04/2022 0953   Hepatic Function Panel     Component Value Date/Time   PROT 7.3 03/04/2022 1056   ALBUMIN 3.9 03/04/2022 1056   AST 16 03/04/2022 1056   ALT 10 03/04/2022 1056   ALKPHOS 65 03/04/2022 1056   BILITOT 0.3 03/04/2022 1056      Component Value Date/Time   TSH 1.44 09/02/2021 1421   Nutritional Lab Results  Component Value Date   VD25OH 47.3 03/04/2022     No follow-ups on file.Marland Kitchen She was informed of the importance of frequent follow up visits to maximize her success with intensive lifestyle modifications for her multiple health conditions.   ATTESTASTION STATEMENTS:  Reviewed by clinician on day of visit: allergies, medications, problem list, medical history, surgical history, family history, social history, and previous encounter notes.     Worthy Rancher, MD

## 2022-09-23 NOTE — Assessment & Plan Note (Signed)
She is currently following Dr. Barker and is benefiting from CBT.  I have reviewed most recent consultation.  We will continue with CBT.  In addition she benefits from incretin therapy as she does have significant hunger signals which makes it difficult to focus on implementing nutritional changes and healthy eating. 

## 2022-10-05 ENCOUNTER — Telehealth (INDEPENDENT_AMBULATORY_CARE_PROVIDER_SITE_OTHER): Payer: BC Managed Care – PPO | Admitting: Psychology

## 2022-10-05 DIAGNOSIS — F5089 Other specified eating disorder: Secondary | ICD-10-CM | POA: Diagnosis not present

## 2022-10-05 NOTE — Progress Notes (Signed)
  Office: [phone]  /  Fax: [phone]    Date: October 05, 2022    Appointment Start Time: 10:02am Duration: 29 minutes Provider: Lawerance Cruel, Psy.D. Type of Session: Individual Therapy  Location of Patient: Home (private location) Location of Provider: Provider's Home (private office) Type of Contact: Telepsychological Visit via MyChart Video Visit  Session Content: Cassie Duncan is a 37 y.o. female presenting for a follow-up appointment to address the previously established treatment goal of increasing coping skills.Today's appointment was a telepsychological visit. Cassie Duncan provided verbal consent for today's telepsychological appointment and she is aware she is responsible for securing confidentiality on her end of the session. Prior to proceeding with today's appointment, Cassie Duncan's physical location at the time of this appointment was obtained as well a phone number she could be reached at in the event of technical difficulties. Cassie Duncan and this provider participated in today's telepsychological service. Of note, today's appointment was switched to a regular telephone call at 10:07am with Cassie Duncan verbal consent due to technical issues.   This provider conducted a brief check-in. Cassie Duncan shared, "I don't feel like Cassie Duncan was the most productive month." Further explored and processed. She noted she "recommitted" to her weight loss/lifestyle change goals since the start of this month. Burn out was discussed. Psychoeducation regarding the importance of self-care utilizing the oxygen mask metaphor was provided. Psychoeducation regarding pleasurable activities, including its impact on emotional eating and overall well-being was provided. Cassie Duncan was provided with a handout with various options of pleasurable activities, and was encouraged to engage in one activity a day and additional activities as needed when triggered to emotionally eat. Cassie Duncan agreed. Cassie Duncan provided verbal consent during today's  appointment for this provider to send a handout with pleasurable activities via e-mail. Overall, Cassie Duncan was receptive to today's appointment as evidenced by openness to sharing, responsiveness to feedback, and willingness to engage in pleasurable activities to assist with coping.  Mental Status Examination:  Appearance: neat Behavior: appropriate to circumstances Mood: sad Affect: mood congruent Speech: WNL Eye Contact: appropriate Psychomotor Activity: WNL Gait: unable to assess Thought Process: linear, logical, and goal directed and no evidence or endorsement of suicidal, homicidal, and self-harm ideation, plan and intent  Thought Content/Perception: no hallucinations, delusions, bizarre thinking or behavior endorsed or observed Orientation: AAOx4 Memory/Concentration: intact Insight: fair Judgment: fair  Interventions:  Conducted a brief chart review Provided empathic reflections and validation Employed supportive psychotherapy interventions to facilitate reduced distress and to improve coping skills with identified stressors Psychoeducation provided regarding pleasurable activities Psychoeducation provided regarding self-care  DSM-5 Diagnosis(es): F50.89 Other Specified Feeding or Eating Disorder, Emotional Eating Behaviors  Treatment Goal & Progress: During the initial appointment with this provider, the following treatment goal was established: increase coping skills. Cassie Duncan has demonstrated progress in her goal as evidenced by increased awareness of hunger patterns and increased awareness of triggers for emotional eating behaviors. Cassie Duncan also continues to demonstrate willingness to engage in learned skill(s).  Plan: Based on appointment availability and Cassie Duncan's schedule, the next appointment is scheduled for 11/02/2022 at 10am, which will be via MyChart Video Visit. The next session will focus on working towards the established treatment goal. Cassie Duncan will continue with her  primary therapist and follow-up on shared referral options.

## 2022-10-25 ENCOUNTER — Other Ambulatory Visit (HOSPITAL_COMMUNITY): Payer: Self-pay

## 2022-10-28 ENCOUNTER — Encounter (INDEPENDENT_AMBULATORY_CARE_PROVIDER_SITE_OTHER): Payer: Self-pay | Admitting: Internal Medicine

## 2022-10-28 ENCOUNTER — Ambulatory Visit (INDEPENDENT_AMBULATORY_CARE_PROVIDER_SITE_OTHER): Payer: BC Managed Care – PPO | Admitting: Internal Medicine

## 2022-10-28 ENCOUNTER — Other Ambulatory Visit: Payer: Self-pay

## 2022-10-28 ENCOUNTER — Other Ambulatory Visit (HOSPITAL_COMMUNITY): Payer: Self-pay

## 2022-10-28 VITALS — BP 115/74 | HR 87 | Temp 98.4°F | Ht 64.0 in | Wt 284.0 lb

## 2022-10-28 DIAGNOSIS — Z6841 Body Mass Index (BMI) 40.0 and over, adult: Secondary | ICD-10-CM

## 2022-10-28 DIAGNOSIS — I10 Essential (primary) hypertension: Secondary | ICD-10-CM | POA: Diagnosis not present

## 2022-10-28 DIAGNOSIS — F5089 Other specified eating disorder: Secondary | ICD-10-CM | POA: Diagnosis not present

## 2022-10-28 MED ORDER — ZEPBOUND 5 MG/0.5ML ~~LOC~~ SOAJ
5.0000 mg | SUBCUTANEOUS | 0 refills | Status: DC
  Filled 2022-10-28: qty 2, 28d supply, fill #0

## 2022-10-28 NOTE — Assessment & Plan Note (Signed)
Her blood pressure has improved.  On amlodipine and labetalol.  Continue current regimen.  Monitor for orthostasis while losing weight.  Losing 10% of body weight may improve blood pressure control.

## 2022-10-28 NOTE — Assessment & Plan Note (Signed)
She is currently following Dr. Dewaine Conger and is benefiting from CBT.  I have reviewed most recent consultation.  She will continue with CBT.  She is also doing well on incretin therapy.  She has noticed an improvement in hunger signals and highly palatable foods are less enticing.  She continues to work on increasing intake of nutritious foods and working with Chief Executive Officer.

## 2022-10-28 NOTE — Progress Notes (Signed)
Office: [phone]  /  Fax: [phone]  WEIGHT SUMMARY AND BIOMETRICS  Vitals Temp: 98.4 F (36.9 C) BP: 115/74 Pulse Rate: 87 SpO2: 100 %   Anthropometric Measurements Height: 5\' 4"  (1.626 m) Weight: 284 lb (128.8 kg) BMI (Calculated): 48.72 Weight at Last Visit: 297lb Weight Lost Since Last Visit: 13lb Weight Gained Since Last Visit: 0 Starting Weight: 247lb Total Weight Loss (lbs): 0 lb (0 kg) Peak Weight: 312lb   Body Composition  Body Fat %: 49.9 % Fat Mass (lbs): 142 lbs Muscle Mass (lbs): 135.4 lbs Total Body Water (lbs): 102 lbs Visceral Fat Rating : 16    No data recorded Today's Visit #: 11  Starting Date: 02/04/22   HPI  Chief Complaint: OBESITY  Cassie Duncan is here to discuss her progress with her obesity treatment plan. She is on the following a lower carbohydrate, vegetable and lean protein rich diet plan and states she is following her eating plan approximately 80 % of the time. She states she is exercising using elliptical, walking and using weights 30-65 minutes 5-7 times per week.  Interval History:  Since last office visit she has lost 10 lbs. she is currently on Zepbound 5 mg once a week she has done 1 out of 4 injections.  She experienced some nausea on the first day but this has resolved.  Denies problems with constipation or acid reflux.  He is working with a Building control surveyor.  Her BIA shows further reductions in body fat and an increase in muscle mass. She reports good adherence to reduced calorie nutritional plan. She has been working on not skipping meals, increasing protein intake at every meal, eating more vegetables, drinking more water, avoiding and or reducing liquid calories, making healthier choices, continues to exercise, reducing portion sizes, mindfulness around eating, and controlling orexigenic cues and stimuli  [employer] Control: Denies problems with appetite and hunger signals.  Denies problems with satiety and  satiation.  Denies problems with eating patterns and portion control.  Denies abnormal cravings. Denies feeling deprived or restricted.   Barriers identified: having difficulty with meal prep and planning, predilection for convenience or prepackaged foods, and work schedule.   Pharmacotherapy for weight loss: She is currently taking Zepbound with adequate clinical response  and without side effects..    ASSESSMENT AND PLAN  TREATMENT PLAN FOR OBESITY:  Recommended Dietary Goals  [name] is currently in the action stage of change. As such, her goal is to continue weight management plan. She has agreed to: continue current plan  Behavioral Intervention  We discussed the following Behavioral Modification Strategies today: increasing lean protein intake, decreasing simple carbohydrates , increasing vegetables, increasing lower glycemic fruits, increasing fiber rich foods, increasing water intake, work on meal planning and preparation, and planning for success.  Additional resources provided today:  Handout on prepackaged healthy foods.  Recommended Physical Activity Goals  [name] has been advised to work up to 150 minutes of moderate intensity aerobic activity a week and strengthening exercises 2-3 times per week for cardiovascular health, weight loss maintenance and preservation of muscle mass.   She has agreed to :  Continue current level of physical activity   Pharmacotherapy We discussed various medication options to help [name] with her weight loss efforts and we both agreed to : continue current anti-obesity medication regimen.  She will be maintained the lowest effective dose.  She is paying for medication out-of-pocket.  ASSOCIATED CONDITIONS ADDRESSED TODAY  Other Specified Feeding or Eating Disorder, Emotional Eating Behaviors  Assessment & Plan: She is currently following Dr. Dewaine Conger and is benefiting from CBT.  I have reviewed most recent consultation.  She will  continue with CBT.  She is also doing well on incretin therapy.  She has noticed an improvement in hunger signals and highly palatable foods are less enticing.  She continues to work on increasing intake of nutritious foods and working with Chief Executive Officer.   Class 3 severe obesity without serious comorbidity with body mass index (BMI) of 45.0 to 49.9 in adult, unspecified obesity type (HCC) -     Zepbound; Inject 5 mg into the skin once a week.  Dispense: 2 mL; Refill: 0  Hypertension, essential Assessment & Plan: Her blood pressure has improved.  On amlodipine and labetalol.  Continue current regimen.  Monitor for orthostasis while losing weight.  Losing 10% of body weight may improve blood pressure control.     PHYSICAL EXAM:  Blood pressure 115/74, pulse 87, temperature 98.4 F (36.9 C), height 5\' 4"  (1.626 m), weight 284 lb (128.8 kg), SpO2 100 %. Body mass index is 48.75 kg/m.  General: She is overweight, cooperative, alert, well developed, and in no acute distress. PSYCH: Has normal mood, affect and thought process.   HEENT: EOMI, sclerae are anicteric. Lungs: Normal breathing effort, no conversational dyspnea. Extremities: No edema.  Neurologic: No gross sensory or motor deficits. No tremors or fasciculations noted.    DIAGNOSTIC DATA REVIEWED:  BMET    Component Value Date/Time   NA 138 03/04/2022 1056   K 4.6 03/04/2022 1056   CL 102 03/04/2022 1056   CO2 23 03/04/2022 1056   GLUCOSE 84 03/04/2022 1056   GLUCOSE 87 09/18/2021 0909   BUN 9 03/04/2022 1056   CREATININE 0.73 03/04/2022 1056   CREATININE 0.79 09/05/2015 1201   CALCIUM 9.5 03/04/2022 1056   GFRNONAA >60 08/31/2021 1821   Lab Results  Component Value Date   HGBA1C 5.4 07/08/2022   HGBA1C 5.8 (H) 02/23/2012   Lab Results  Component Value Date   INSULIN 5.3 02/04/2022   Lab Results  Component Value Date   TSH 1.44 09/02/2021   CBC    Component Value Date/Time   WBC 5.3 03/04/2022 1056    WBC 6.7 09/02/2021 1421   RBC 4.03 03/04/2022 1056   RBC 4.78 09/02/2021 1421   HGB 11.7 03/04/2022 1056   HCT 35.0 03/04/2022 1056   PLT 403.0 (H) 09/02/2021 1421   MCV 87 03/04/2022 1056   MCH 29.0 03/04/2022 1056   MCH 29.4 08/31/2021 1821   MCHC 33.4 03/04/2022 1056   MCHC 33.0 09/02/2021 1421   RDW 12.4 03/04/2022 1056   Iron Studies No results found for: "IRON", "TIBC", "FERRITIN", "IRONPCTSAT" Lipid Panel     Component Value Date/Time   CHOL 161 02/04/2022 0953   TRIG 33 02/04/2022 0953   HDL 47 02/04/2022 0953   CHOLHDL 4 09/02/2021 1421   VLDL 9.4 09/02/2021 1421   LDLCALC 106 (H) 02/04/2022 0953   Hepatic Function Panel     Component Value Date/Time   PROT 7.3 03/04/2022 1056   ALBUMIN 3.9 03/04/2022 1056   AST 16 03/04/2022 1056   ALT 10 03/04/2022 1056   ALKPHOS 65 03/04/2022 1056   BILITOT 0.3 03/04/2022 1056      Component Value Date/Time   TSH 1.44 09/02/2021 1421   Nutritional Lab Results  Component Value Date   VD25OH 47.3 03/04/2022     Return in about 3 weeks (around 11/18/2022) for  For Weight Mangement with [name].Marland Kitchen She was informed of the importance of frequent follow up visits to maximize her success with intensive lifestyle modifications for her multiple health conditions.   ATTESTASTION STATEMENTS:  Reviewed by clinician on day of visit: allergies, medications, problem list, medical history, surgical history, family history, social history, and previous encounter notes.     Worthy Rancher, MD

## 2022-11-02 ENCOUNTER — Telehealth (INDEPENDENT_AMBULATORY_CARE_PROVIDER_SITE_OTHER): Payer: BC Managed Care – PPO | Admitting: Psychology

## 2022-11-02 DIAGNOSIS — F5089 Other specified eating disorder: Secondary | ICD-10-CM | POA: Diagnosis not present

## 2022-11-02 DIAGNOSIS — F4320 Adjustment disorder, unspecified: Secondary | ICD-10-CM

## 2022-11-02 NOTE — Progress Notes (Signed)
  Office: [phone]  /  Fax: [phone]    Date: November 02, 2022  Appointment Start Time: 10:03am Duration: 31 minutes Provider: Lawerance Cruel, Psy.D. Type of Session: Individual Therapy  Location of Patient: Home (private location) Location of Provider: Provider's Home (private office) Type of Contact: Telepsychological Visit via MyChart Video Visit  Session Content: Cassie Duncan is a 37 y.o. female presenting for a follow-up appointment to address the previously established treatment goal of increasing coping skills.Today's appointment was a telepsychological visit. Kedra provided verbal consent for today's telepsychological appointment and she is aware she is responsible for securing confidentiality on her end of the session. Prior to proceeding with today's appointment, Kacie's physical location at the time of this appointment was obtained as well a phone number she could be reached at in the event of technical difficulties. Neilani and this provider participated in today's telepsychological service.   This provider conducted a brief check-in. Tayleigh shared about recent events, noting she is setting effective boundaries. She reflected despite ongoing work stress, there has been a reduction in emotional eating behaviors. Notably, Dyonne reported observing a pattern in mood and motivation, noting she tends to feel better and is successful with weight loss during the summer months. Further explored and processed. Furthermore, reflected on progress to date and she was encouraged to continue engaging in self-care. Overall, [name] was receptive to today's appointment as evidenced by openness to sharing, responsiveness to feedback, and willingness to implement discussed strategies .  Mental Status Examination:  Appearance: neat Behavior: appropriate to circumstances Mood: neutral Affect: mood congruent Speech: WNL Eye Contact: appropriate Psychomotor Activity: WNL Gait: unable to  assess Thought Process: linear, logical, and goal directed and no evidence or endorsement of suicidal, homicidal, and self-harm ideation, plan and intent  Thought Content/Perception: no hallucinations, delusions, bizarre thinking or behavior endorsed or observed Orientation: AAOx4 Memory/Concentration: intact Insight: good Judgment: good  Interventions:  Conducted a brief chart review Provided empathic reflections and validation Reviewed content from the previous session Provided positive reinforcement Employed supportive psychotherapy interventions to facilitate reduced distress and to improve coping skills with identified stressors  DSM-5 Diagnosis(es): F50.89 Other Specified Feeding or Eating Disorder, Emotional Eating Behaviors and F43.20 Adjustment Disorder, Unspecified   Treatment Goal & Progress: During the initial appointment with this provider, the following treatment goal was established: increase coping skills. [name] has demonstrated progress in her goal as evidenced by increased awareness of hunger patterns, increased awareness of triggers for emotional eating behaviors, and reduction in emotional eating behaviors . [name] also continues to demonstrate willingness to engage in learned skill(s).  Plan: The next appointment is scheduled for 12/06/2022 at 11am, which will be via MyChart Video Visit. The next session will focus on working towards the established treatment goal. Additionally, she provided verbal consent for this provider to place a referral with [facility].

## 2022-11-16 ENCOUNTER — Ambulatory Visit (INDEPENDENT_AMBULATORY_CARE_PROVIDER_SITE_OTHER): Payer: BC Managed Care – PPO | Admitting: Internal Medicine

## 2022-11-16 ENCOUNTER — Other Ambulatory Visit (HOSPITAL_COMMUNITY): Payer: Self-pay

## 2022-11-16 VITALS — BP 125/82 | HR 92 | Temp 98.3°F | Ht 64.0 in | Wt 281.0 lb

## 2022-11-16 DIAGNOSIS — Z6841 Body Mass Index (BMI) 40.0 and over, adult: Secondary | ICD-10-CM

## 2022-11-16 DIAGNOSIS — F5089 Other specified eating disorder: Secondary | ICD-10-CM

## 2022-11-16 DIAGNOSIS — R7303 Prediabetes: Secondary | ICD-10-CM

## 2022-11-16 MED ORDER — ZEPBOUND 7.5 MG/0.5ML ~~LOC~~ SOAJ
7.5000 mg | SUBCUTANEOUS | 0 refills | Status: DC
  Filled 2022-11-16: qty 2, 28d supply, fill #0
  Filled 2022-12-16 – 2023-01-04 (×2): qty 2, 28d supply, fill #1

## 2022-11-16 NOTE — Progress Notes (Signed)
Office: [phone]  /  Fax: [phone]  WEIGHT SUMMARY AND BIOMETRICS  Vitals Temp: 98.3 F (36.8 C) BP: 125/82 Pulse Rate: 92 SpO2: 98 %   Anthropometric Measurements Height: 5\' 4"  (1.626 m) Weight: 281 lb (127.5 kg) BMI (Calculated): 48.21 Weight at Last Visit: 284 lb Weight Lost Since Last Visit: 3 lb Weight Gained Since Last Visit: 0 Starting Weight: 274 lb Total Weight Loss (lbs): 0 lb (0 kg) Peak Weight: 312 lb   Body Composition  Body Fat %: 50.5 % Fat Mass (lbs): 142.2 lbs Muscle Mass (lbs): 132.4 lbs Total Body Water (lbs): 103 lbs Visceral Fat Rating : 16    No data recorded Today's Visit #: 12  Starting Date: 02/04/22    HPI  Chief Complaint: OBESITY  Cassie Duncan is here to discuss her progress with her obesity treatment plan. She is on the practicing portion control and making smarter food choices, such as increasing vegetables and decreasing simple carbohydrates and states she is following her eating plan approximately 90 % of the time. She states she is exercising, treadmill and cardio 45-65 minutes 7 times per week.  Interval History:  Since last office visit she has lost 3 lbs. Tracking and journaling some but not keeping track of calories for macronutrient composition.  Upon review of her phone she is skipping lunch several days out of the week and is not getting the recommended amount of protein.  She is also consuming dried fruit and not getting enough water. She reports fair adherence to reduced calorie nutritional plan. She has been working on Teacher, adult education calories, making healthier choices, and continues to exercise  Orixegenic Control: Reports problems with appetite and hunger signals.  Reports problems with satiety and satiation.  Denies problems with eating patterns and portion control.  Reports abnormal cravings. Denies feeling deprived or restricted.   Barriers identified: strong hunger signals and appetite, having  difficulty preparing healthy meals, and work schedule.   Pharmacotherapy for weight loss: She is currently taking Zepbound with adequate clinical response  and without side effects..    ASSESSMENT AND PLAN  TREATMENT PLAN FOR OBESITY:  Recommended Dietary Goals  Shawnice is currently in the action stage of change. As such, her goal is to continue weight management plan. She has agreed to: continue current plan  Behavioral Intervention  We discussed the following Behavioral Modification Strategies today: increasing lean protein intake, decreasing simple carbohydrates , increasing vegetables, increasing lower glycemic fruits, increasing fiber rich foods, avoiding skipping meals, increasing water intake, work on meal planning and preparation, work on tracking and journaling calories using tracking application, continue to practice mindfulness when eating, and planning for success.  Additional resources provided today: Handout on how to make protein smoothies and handout on prepackaged foods  Recommended Physical Activity Goals  Hadlyn has been advised to work up to 150 minutes of moderate intensity aerobic activity a week and strengthening exercises 2-3 times per week for cardiovascular health, weight loss maintenance and preservation of muscle mass.   She has agreed to :  Continue current level of physical activity   Pharmacotherapy We discussed various medication options to help Darleene with her weight loss efforts and we both agreed to : increase Zepbound to 7.5 mg once a week.  ASSOCIATED CONDITIONS ADDRESSED TODAY  Prediabetes Assessment & Plan: Most recent A1c is  Lab Results  Component Value Date   HGBA1C 5.4 07/08/2022   HGBA1C 5.8 (H) 02/23/2012   And improved.  She will continue  on Zepbound for pharmacoprophylaxis.      Class 3 severe obesity with serious comorbidity and body mass index (BMI) of 45.0 to 49.9 in adult, unspecified obesity type [facility]) Assessment &  Plan: She has lost 16 pounds.  I would like for her to use apps for more accurate tracking and journaling.  She is skipping meals and not getting enough protein.  She is currently on Zepbound without any adverse effects.  She is noticing some tachyphylaxis.  We will increase medication to 7.5 mg once a week.  We again emphasized the importance of maintaining adequate protein intake and not skipping meals.  Orders: -     Zepbound; Inject 7.5 mg into the skin once a week.  Dispense: 6 mL; Refill: 0  Other Specified Feeding or Eating Disorder, Emotional Eating Behaviors Assessment & Plan: She is currently following Dr. Dewaine Conger and is benefiting from CBT.  I have reviewed most recent consultation.  She will continue with CBT.  She is also doing well on incretin therapy.  She has noticed an improvement in hunger signals and highly palatable foods are less enticing.  She continues to work on increasing intake of nutritious foods and working with Chief Executive Officer.     PHYSICAL EXAM:  Blood pressure 125/82, pulse 92, temperature 98.3 F (36.8 C), height 5\' 4"  (1.626 m), weight 281 lb (127.5 kg), SpO2 98%. Body mass index is 48.23 kg/m.  General: She is overweight, cooperative, alert, well developed, and in no acute distress. PSYCH: Has normal mood, affect and thought process.   HEENT: EOMI, sclerae are anicteric. Lungs: Normal breathing effort, no conversational dyspnea. Extremities: No edema.  Neurologic: No gross sensory or motor deficits. No tremors or fasciculations noted.    DIAGNOSTIC DATA REVIEWED:  BMET    Component Value Date/Time   NA 138 03/04/2022 1056   K 4.6 03/04/2022 1056   CL 102 03/04/2022 1056   CO2 23 03/04/2022 1056   GLUCOSE 84 03/04/2022 1056   GLUCOSE 87 09/18/2021 0909   BUN 9 03/04/2022 1056   CREATININE 0.73 03/04/2022 1056   CREATININE 0.79 09/05/2015 1201   CALCIUM 9.5 03/04/2022 1056   GFRNONAA >60 08/31/2021 1821   Lab Results  Component Value Date    HGBA1C 5.4 07/08/2022   HGBA1C 5.8 (H) 02/23/2012   Lab Results  Component Value Date   INSULIN 5.3 02/04/2022   Lab Results  Component Value Date   TSH 1.44 09/02/2021   CBC    Component Value Date/Time   WBC 5.3 03/04/2022 1056   WBC 6.7 09/02/2021 1421   RBC 4.03 03/04/2022 1056   RBC 4.78 09/02/2021 1421   HGB 11.7 03/04/2022 1056   HCT 35.0 03/04/2022 1056   PLT 403.0 (H) 09/02/2021 1421   MCV 87 03/04/2022 1056   MCH 29.0 03/04/2022 1056   MCH 29.4 08/31/2021 1821   MCHC 33.4 03/04/2022 1056   MCHC 33.0 09/02/2021 1421   RDW 12.4 03/04/2022 1056   Iron Studies No results found for: "IRON", "TIBC", "FERRITIN", "IRONPCTSAT" Lipid Panel     Component Value Date/Time   CHOL 161 02/04/2022 0953   TRIG 33 02/04/2022 0953   HDL 47 02/04/2022 0953   CHOLHDL 4 09/02/2021 1421   VLDL 9.4 09/02/2021 1421   LDLCALC 106 (H) 02/04/2022 0953   Hepatic Function Panel     Component Value Date/Time   PROT 7.3 03/04/2022 1056   ALBUMIN 3.9 03/04/2022 1056   AST 16 03/04/2022 1056   ALT  10 03/04/2022 1056   ALKPHOS 65 03/04/2022 1056   BILITOT 0.3 03/04/2022 1056      Component Value Date/Time   TSH 1.44 09/02/2021 1421   Nutritional Lab Results  Component Value Date   VD25OH 47.3 03/04/2022     No follow-ups on file.Marland Kitchen She was informed of the importance of frequent follow up visits to maximize her success with intensive lifestyle modifications for her multiple health conditions.   ATTESTASTION STATEMENTS:  Reviewed by clinician on day of visit: allergies, medications, problem list, medical history, surgical history, family history, social history, and previous encounter notes.     Worthy Rancher, MD

## 2022-11-16 NOTE — Assessment & Plan Note (Signed)
She has lost 16 pounds.  I would like for her to use apps for more accurate tracking and journaling.  She is skipping meals and not getting enough protein.  She is currently on Zepbound without any adverse effects.  She is noticing some tachyphylaxis.  We will increase medication to 7.5 mg once a week.  We again emphasized the importance of maintaining adequate protein intake and not skipping meals.

## 2022-11-16 NOTE — Assessment & Plan Note (Signed)
 She is currently following Dr. Dewaine Conger and is benefiting from CBT.  I have reviewed most recent consultation.  She will continue with CBT.  She is also doing well on incretin therapy.  She has noticed an improvement in hunger signals and highly palatable foods are less enticing.  She continues to work on increasing intake of nutritious foods and working with Chief Executive Officer.

## 2022-11-16 NOTE — Assessment & Plan Note (Signed)
Most recent A1c is  Lab Results  Component Value Date   HGBA1C 5.4 07/08/2022   HGBA1C 5.8 (H) 02/23/2012   And improved.  She will continue on Zepbound for pharmacoprophylaxis.

## 2022-12-06 ENCOUNTER — Telehealth (INDEPENDENT_AMBULATORY_CARE_PROVIDER_SITE_OTHER): Payer: BC Managed Care – PPO | Admitting: Psychology

## 2022-12-06 DIAGNOSIS — F5089 Other specified eating disorder: Secondary | ICD-10-CM | POA: Diagnosis not present

## 2022-12-06 DIAGNOSIS — F4320 Adjustment disorder, unspecified: Secondary | ICD-10-CM | POA: Diagnosis not present

## 2022-12-06 NOTE — Progress Notes (Signed)
  Office: [phone]  /  Fax: [phone]    Date: December 06, 2022  Appointment Start Time: 11:32am Duration: 27 minutes Provider: Lawerance Cruel, Psy.D. Type of Session: Individual Therapy  Location of Patient: Home (private location) Location of Provider: Provider's Home (private office) Type of Contact: Telepsychological Visit via MyChart Video Visit  Session Content: Cassie Duncan is a 37 y.o. female presenting for a follow-up appointment to address the previously established treatment goal of increasing coping skills.Today's appointment was a telepsychological visit. Lucylle provided verbal consent for today's telepsychological appointment and she is aware she is responsible for securing confidentiality on her end of the session. Prior to proceeding with today's appointment, Tahiri's physical location at the time of this appointment was obtained as well a phone number she could be reached at in the event of technical difficulties. Iasha and this provider participated in today's telepsychological service.   This provider conducted a brief check-in. [place]  shared, "I've been doing better." She noted she hired a Manufacturing engineer to help reduce stressors/responsibilities. Additionally, she discussed an improvement in eating habits and physical activity. Explored what has helped her make the aforementioned changes and what continues to be a barrier (e.g., fatigue, birthday month). She discussed "accountability" is very important for her which has been helpful (e.g.,hiring a Systems analyst). Associated thoughts and feelings explored and processed. Overall, Baleria was receptive to today's appointment as evidenced by openness to sharing, responsiveness to feedback, and willingness to implement discussed strategies .  Mental Status Examination:  Appearance: neat Behavior: appropriate to circumstances Mood: neutral Affect: mood congruent Speech: WNL Eye Contact: appropriate Psychomotor  Activity: WNL Gait: unable to assess Thought Process: linear, logical, and goal directed and no evidence or endorsement of suicidal, homicidal, and self-harm ideation, plan and intent  Thought Content/Perception: no hallucinations, delusions, bizarre thinking or behavior endorsed or observed Orientation: AAOx4 Memory/Concentration: intact Insight: good Judgment: good  Interventions:  Conducted a brief chart review Provided empathic reflections and validation Provided positive reinforcement Employed supportive psychotherapy interventions to facilitate reduced distress and to improve coping skills with identified stressors Engaged patient in thought challenging/cognitive restructuring   DSM-5 Diagnosis(es):  F50.89 Other Specified Feeding or Eating Disorder, Emotional Eating Behaviors and F43.20 Adjustment Disorder, Unspecified   Treatment Goal & Progress: During the initial appointment with this provider, the following treatment goal was established: increase coping skills. [name] has demonstrated progress in her goal as evidenced by increased awareness of hunger patterns, increased awareness of triggers for emotional eating behaviors, and reduction in emotional eating behaviors . [name] also continues to demonstrate willingness to engage in learned skill(s).  Plan: The next appointment is scheduled for 12/27/2022 at 2pm, which will be via MyChart Video Visit. The next session will focus on working towards the established treatment goal. [name] stated she will establish care with [facility] [place] [facility].

## 2022-12-08 DIAGNOSIS — G4733 Obstructive sleep apnea (adult) (pediatric): Secondary | ICD-10-CM | POA: Diagnosis not present

## 2022-12-16 ENCOUNTER — Ambulatory Visit (INDEPENDENT_AMBULATORY_CARE_PROVIDER_SITE_OTHER): Payer: BC Managed Care – PPO | Admitting: Internal Medicine

## 2022-12-16 ENCOUNTER — Other Ambulatory Visit (HOSPITAL_COMMUNITY): Payer: Self-pay

## 2022-12-16 ENCOUNTER — Encounter (INDEPENDENT_AMBULATORY_CARE_PROVIDER_SITE_OTHER): Payer: Self-pay | Admitting: Internal Medicine

## 2022-12-16 VITALS — BP 134/74 | HR 66 | Temp 97.9°F | Ht 64.0 in | Wt 270.0 lb

## 2022-12-16 DIAGNOSIS — R7303 Prediabetes: Secondary | ICD-10-CM | POA: Diagnosis not present

## 2022-12-16 DIAGNOSIS — Z6841 Body Mass Index (BMI) 40.0 and over, adult: Secondary | ICD-10-CM | POA: Diagnosis not present

## 2022-12-16 DIAGNOSIS — F5089 Other specified eating disorder: Secondary | ICD-10-CM

## 2022-12-16 NOTE — Assessment & Plan Note (Signed)
She is currently following Dr. Dewaine Conger and is benefiting from CBT.  I have reviewed most recent consultation.  She will continue with CBT.  She is also doing well on incretin therapy.  She has noticed an improvement in hunger signals and is consuming nutritious foods and working with fitness trainer.

## 2022-12-16 NOTE — Assessment & Plan Note (Signed)
Most recent A1c is  Lab Results  Component Value Date   HGBA1C 5.4 07/08/2022   HGBA1C 5.8 (H) 02/23/2012   And improved.  She is currently on Zepbound 5 mg once a week.  Medication will be increased to 7-1/2 mg weekly for incretin effect

## 2022-12-16 NOTE — Progress Notes (Signed)
Office: [phone]  /  Fax: [phone]  WEIGHT SUMMARY AND BIOMETRICS  Vitals Temp: 97.9 F (36.6 C) BP: 134/74 Pulse Rate: 66 SpO2: 97 %   Anthropometric Measurements Height: 5\' 4"  (1.626 m) Weight: 270 lb (122.5 kg) BMI (Calculated): 46.32 Weight at Last Visit: 281 lb Weight Lost Since Last Visit: 11 lb Weight Gained Since Last Visit: 0 lb Starting Weight: 274 lb Total Weight Loss (lbs): 4 lb (1.814 kg) Peak Weight: 312 lb   Body Composition  Body Fat %: 48.4 % Fat Mass (lbs): 131 lbs Muscle Mass (lbs): 132.6 lbs Total Body Water (lbs): 101.6 lbs Visceral Fat Rating : 15    No data recorded Today's Visit #: 13  Starting Date: 02/04/22   HPI  Chief Complaint: OBESITY  Cassie Duncan is here to discuss her progress with her obesity treatment plan. She is on the following a lower carbohydrate, vegetable and lean protein rich diet plan and states she is following her eating plan approximately 90 % of the time. She states she is exercising 75 minutes 7 times per week.  Interval History:  Since last office visit she has lost 11 lbs. She reports good adherence to reduced calorie nutritional plan. She has been working on reading food labels, not skipping meals, increasing protein intake at every meal, eating more fruits, eating more vegetables, drinking more water, journaling and tracking calories, making healthier choices, continues to exercise, mindfulness around eating, and she is working with a Building control surveyor and has also been working with Dr. Dewaine Conger.  Orixegenic Control: Denies problems with appetite and hunger signals.  Denies problems with satiety and satiation.  Denies problems with eating patterns and portion control.  Denies abnormal cravings. Denies feeling deprived or restricted.   Barriers identified: strong hunger signals and appetite.   Pharmacotherapy for weight loss: She is currently taking Zepbound with adequate clinical response  and without  side effects..    ASSESSMENT AND PLAN  TREATMENT PLAN FOR OBESITY:  Recommended Dietary Goals  [name] is currently in the action stage of change. As such, her goal is to continue weight management plan. She has agreed to: continue current plan  Behavioral Intervention  We discussed the following Behavioral Modification Strategies today: increasing lean protein intake, decreasing simple carbohydrates , increasing vegetables, increasing lower glycemic fruits, increasing fiber rich foods, increasing water intake, continue to practice mindfulness when eating, and planning for success.  Additional resources provided today: None  Recommended Physical Activity Goals  [name] has been advised to work up to 150 minutes of moderate intensity aerobic activity a week and strengthening exercises 2-3 times per week for cardiovascular health, weight loss maintenance and preservation of muscle mass.   She has agreed to :  Continue to work with Building control surveyor.  We emphasized the importance of progressive strengthening and also variation to avoid physiological adaptations.  Pharmacotherapy We discussed various medication options to help Miral with her weight loss efforts and we both agreed to : increase Zepbound to 7.5 mg once a week.  She will contact us after 2 dosages to determine if dose adjustment is required.  Right now she is tolerating 5 mg dose without any adverse effects and adequate clinical response.  ASSOCIATED CONDITIONS ADDRESSED TODAY  Prediabetes Assessment & Plan: Most recent A1c is  Lab Results  Component Value Date   HGBA1C 5.4 07/08/2022   HGBA1C 5.8 (H) 02/23/2012   And improved.  She is currently on Zepbound 5 mg once a week.  Medication will  be increased to 7-1/2 mg weekly for incretin effect    Class 3 severe obesity with serious comorbidity and body mass index (BMI) of 45.0 to 49.9 in adult, unspecified obesity type [facility]) Assessment & Plan: [name] has lost  27 pounds.  Her BIA shows a reduction in body fat percentage improvement in visceral fat rating and preservation of muscle mass.  She will continue medically supervised weight management plan.   Other Specified Feeding or Eating Disorder, Emotional Eating Behaviors Assessment & Plan: She is currently following Dr. Dewaine Conger and is benefiting from CBT.  I have reviewed most recent consultation.  She will continue with CBT.  She is also doing well on incretin therapy.  She has noticed an improvement in hunger signals and is consuming nutritious foods and working with fitness trainer.     PHYSICAL EXAM:  Blood pressure 134/74, pulse 66, temperature 97.9 F (36.6 C), height 5\' 4"  (1.626 m), weight 270 lb (122.5 kg), last menstrual period 12/14/2022, SpO2 97%. Body mass index is 46.35 kg/m.  General: She is overweight, cooperative, alert, well developed, and in no acute distress. PSYCH: Has normal mood, affect and thought process.   HEENT: EOMI, sclerae are anicteric. Lungs: Normal breathing effort, no conversational dyspnea. Extremities: No edema.  Neurologic: No gross sensory or motor deficits. No tremors or fasciculations noted.    DIAGNOSTIC DATA REVIEWED:  BMET    Component Value Date/Time   NA 138 03/04/2022 1056   K 4.6 03/04/2022 1056   CL 102 03/04/2022 1056   CO2 23 03/04/2022 1056   GLUCOSE 84 03/04/2022 1056   GLUCOSE 87 09/18/2021 0909   BUN 9 03/04/2022 1056   CREATININE 0.73 03/04/2022 1056   CREATININE 0.79 09/05/2015 1201   CALCIUM 9.5 03/04/2022 1056   GFRNONAA >60 08/31/2021 1821   Lab Results  Component Value Date   HGBA1C 5.4 07/08/2022   HGBA1C 5.8 (H) 02/23/2012   Lab Results  Component Value Date   INSULIN 5.3 02/04/2022   Lab Results  Component Value Date   TSH 1.44 09/02/2021   CBC    Component Value Date/Time   WBC 5.3 03/04/2022 1056   WBC 6.7 09/02/2021 1421   RBC 4.03 03/04/2022 1056   RBC 4.78 09/02/2021 1421   HGB 11.7 03/04/2022  1056   HCT 35.0 03/04/2022 1056   PLT 403.0 (H) 09/02/2021 1421   MCV 87 03/04/2022 1056   MCH 29.0 03/04/2022 1056   MCH 29.4 08/31/2021 1821   MCHC 33.4 03/04/2022 1056   MCHC 33.0 09/02/2021 1421   RDW 12.4 03/04/2022 1056   Iron Studies No results found for: "IRON", "TIBC", "FERRITIN", "IRONPCTSAT" Lipid Panel     Component Value Date/Time   CHOL 161 02/04/2022 0953   TRIG 33 02/04/2022 0953   HDL 47 02/04/2022 0953   CHOLHDL 4 09/02/2021 1421   VLDL 9.4 09/02/2021 1421   LDLCALC 106 (H) 02/04/2022 0953   Hepatic Function Panel     Component Value Date/Time   PROT 7.3 03/04/2022 1056   ALBUMIN 3.9 03/04/2022 1056   AST 16 03/04/2022 1056   ALT 10 03/04/2022 1056   ALKPHOS 65 03/04/2022 1056   BILITOT 0.3 03/04/2022 1056      Component Value Date/Time   TSH 1.44 09/02/2021 1421   Nutritional Lab Results  Component Value Date   VD25OH 47.3 03/04/2022     Return in about 4 weeks (around 01/13/2023) for For Weight Mangement with [name].Marland Kitchen She was informed of the  importance of frequent follow up visits to maximize her success with intensive lifestyle modifications for her multiple health conditions.   ATTESTASTION STATEMENTS:  Reviewed by clinician on day of visit: allergies, medications, problem list, medical history, surgical history, family history, social history, and previous encounter notes.     Worthy Rancher, MD

## 2022-12-16 NOTE — Assessment & Plan Note (Signed)
Cassie Duncan has lost 27 pounds.  Her BIA shows a reduction in body fat percentage improvement in visceral fat rating and preservation of muscle mass.  She will continue medically supervised weight management plan.

## 2022-12-17 ENCOUNTER — Other Ambulatory Visit: Payer: Self-pay

## 2022-12-22 ENCOUNTER — Other Ambulatory Visit: Payer: Self-pay

## 2022-12-27 ENCOUNTER — Telehealth (INDEPENDENT_AMBULATORY_CARE_PROVIDER_SITE_OTHER): Payer: BC Managed Care – PPO | Admitting: Psychology

## 2022-12-27 DIAGNOSIS — F4320 Adjustment disorder, unspecified: Secondary | ICD-10-CM

## 2022-12-27 DIAGNOSIS — F5089 Other specified eating disorder: Secondary | ICD-10-CM | POA: Diagnosis not present

## 2022-12-27 NOTE — Progress Notes (Signed)
  Office: [phone]  /  Fax: [phone]    Date: December 27, 2022  Appointment Start Time: 2:04pm Duration: 31 minutes Provider: Lawerance Cruel, Psy.D. Type of Session: Individual Therapy  Location of Patient: Home (private location) Location of Provider: Provider's Home (private office) Type of Contact: Telepsychological Visit via MyChart Video Visit  Session Content: This provider called Cassie Duncan at 2:02pm as she did not present for today's appointment. She stated she was "about to hop on." As such, today's appointment was initiated 4 minutes late.[name] is a 37 y.o. female presenting for a follow-up appointment to address the previously established treatment goal of increasing coping skills.Today's appointment was a telepsychological visit. [name] provided verbal consent for today's telepsychological appointment and she is aware she is responsible for securing confidentiality on her end of the session. Prior to proceeding with today's appointment, [name]'s physical location at the time of this appointment was obtained as well a phone number she could be reached at in the event of technical difficulties. [name] and this provider participated in today's telepsychological service.   This provider conducted a brief check-in. [name] shared she is "doing well," especially since having a Manufacturing engineer; however, she expressed concern regarding her ability to continue when she lets her assistant go. Further explored and processed. She noted a plan to utilize her Mondays to continue to engage in meal prep. To further assist with coping with unhelpful thinking patterns, [name] was engaged in thought challenging and reframing. Furthermore, termination planning was discussed. [name] was receptive to a follow-up appointment in 3-4 weeks and an additional follow-up/termination appointment in 3-4 weeks after that. Overall, [name] was receptive to today's appointment as evidenced by openness to  sharing, responsiveness to feedback, and willingness to continue engaging in learned skills.  Mental Status Examination:  Appearance: neat Behavior: appropriate to circumstances Mood: neutral Affect: mood congruent Speech: WNL Eye Contact: appropriate Psychomotor Activity: WNL Gait: unable to assess Thought Process: linear, logical, and goal directed and no evidence or endorsement of suicidal, homicidal, and self-harm ideation, plan and intent  Thought Content/Perception: no hallucinations, delusions, bizarre thinking or behavior endorsed or observed Orientation: AAOx4 Memory/Concentration: intact Insight: good Judgment: good  Interventions:  Conducted a brief chart review Provided empathic reflections and validation Provided positive reinforcement Employed supportive psychotherapy interventions to facilitate reduced distress and to improve coping skills with identified stressors Engaged patient in thought challenging/cognitive restructuring  Discussed termination planning   DSM-5 Diagnosis(es):  F50.89 Other Specified Feeding or Eating Disorder, Emotional Eating Behaviors and F43.20 Adjustment Disorder, Unspecified   Treatment Goal & Progress: During the initial appointment with this provider, the following treatment goal was established: increase coping skills. [name] has demonstrated progress in her goal as evidenced by increased awareness of hunger patterns, increased awareness of triggers for emotional eating behaviors, and reduction in emotional eating behaviors . [name] also continues to demonstrate willingness to engage in learned skill(s).  Plan: The next appointment is scheduled for 01/24/2023 at 10am, which will be via MyChart Video Visit. The next session will focus on working towards the established treatment goal. [name] stated she will establish care with [facility].

## 2022-12-28 ENCOUNTER — Other Ambulatory Visit (HOSPITAL_COMMUNITY): Payer: Self-pay

## 2023-01-04 ENCOUNTER — Encounter (INDEPENDENT_AMBULATORY_CARE_PROVIDER_SITE_OTHER): Payer: Self-pay

## 2023-01-04 ENCOUNTER — Other Ambulatory Visit (INDEPENDENT_AMBULATORY_CARE_PROVIDER_SITE_OTHER): Payer: Self-pay | Admitting: Internal Medicine

## 2023-01-04 ENCOUNTER — Other Ambulatory Visit (HOSPITAL_COMMUNITY): Payer: Self-pay

## 2023-01-04 ENCOUNTER — Encounter (INDEPENDENT_AMBULATORY_CARE_PROVIDER_SITE_OTHER): Payer: Self-pay | Admitting: Internal Medicine

## 2023-01-04 MED ORDER — ZEPBOUND 7.5 MG/0.5ML ~~LOC~~ SOAJ
7.5000 mg | SUBCUTANEOUS | 0 refills | Status: DC
Start: 2023-01-04 — End: 2023-01-10

## 2023-01-10 ENCOUNTER — Other Ambulatory Visit (HOSPITAL_COMMUNITY): Payer: Self-pay

## 2023-01-10 ENCOUNTER — Other Ambulatory Visit (INDEPENDENT_AMBULATORY_CARE_PROVIDER_SITE_OTHER): Payer: Self-pay

## 2023-01-10 MED ORDER — ZEPBOUND 7.5 MG/0.5ML ~~LOC~~ SOAJ
7.5000 mg | SUBCUTANEOUS | 0 refills | Status: DC
Start: 2023-01-10 — End: 2023-02-17
  Filled 2023-01-10 – 2023-01-28 (×2): qty 2, 28d supply, fill #0

## 2023-01-10 NOTE — Telephone Encounter (Signed)
Please advise due to patient calling this morning. Thanks!

## 2023-01-13 ENCOUNTER — Ambulatory Visit (INDEPENDENT_AMBULATORY_CARE_PROVIDER_SITE_OTHER): Payer: BC Managed Care – PPO | Admitting: Internal Medicine

## 2023-01-13 ENCOUNTER — Encounter (INDEPENDENT_AMBULATORY_CARE_PROVIDER_SITE_OTHER): Payer: Self-pay | Admitting: Internal Medicine

## 2023-01-13 VITALS — BP 130/83 | HR 79 | Temp 97.3°F | Ht 64.0 in | Wt 271.0 lb

## 2023-01-13 DIAGNOSIS — R638 Other symptoms and signs concerning food and fluid intake: Secondary | ICD-10-CM | POA: Diagnosis not present

## 2023-01-13 DIAGNOSIS — Z6841 Body Mass Index (BMI) 40.0 and over, adult: Secondary | ICD-10-CM | POA: Diagnosis not present

## 2023-01-13 DIAGNOSIS — R7303 Prediabetes: Secondary | ICD-10-CM | POA: Diagnosis not present

## 2023-01-13 NOTE — Progress Notes (Signed)
Office: [phone]  /  Fax: [phone]  WEIGHT SUMMARY AND BIOMETRICS  Vitals Temp: (!) 97.3 F (36.3 C) BP: 130/83 Pulse Rate: 79 SpO2: 99 %   Anthropometric Measurements Height: 5\' 4"  (1.626 m) Weight: 271 lb (122.9 kg) BMI (Calculated): 46.49 Weight at Last Visit: 270 lb Weight Lost Since Last Visit: 0 lb Weight Gained Since Last Visit: 1 lb Starting Weight: 274 lb Total Weight Loss (lbs): 3 lb (1.361 kg) Peak Weight: 312 lb   Body Composition  Body Fat %: 50.1 % Fat Mass (lbs): 136.2 lbs Muscle Mass (lbs): 128.8 lbs Total Body Water (lbs): 107.6 lbs Visceral Fat Rating : 16    No data recorded Today's Visit #: 14  Starting Date: 02/05/23   HPI  Chief Complaint: OBESITY  Cassie Duncan is here to discuss her progress with her obesity treatment plan. She is on the low carb and states she is following her eating plan approximately 60-90 % of the time. She states she is exercising 90 minutes 7 times per week.  Interval History:  Since last office visit she has gained 1 pounds. She reports fair adherence to reduced calorie nutritional plan. She has been working on not skipping meals, increasing protein intake at every meal, drinking more water, avoiding and / or reducing liquid calories, journaling and tracking calories, making healthier choices, continues to exercise, mindfulness around eating, and controlling orexigenic cues and stimuli  Orexigenic Control: Reports problems with appetite and hunger signals.  Denies problems with satiety and satiation.  Denies problems with eating patterns and portion control.  Denies abnormal cravings. Denies feeling deprived or restricted.   Barriers identified: strong hunger signals and appetite, cost of medication, work schedule, and difficulty maintaining a reduced calorie state.   Pharmacotherapy for weight loss: She is currently taking Zepbound with adequate clinical response  and without side effects..     ASSESSMENT AND PLAN  TREATMENT PLAN FOR OBESITY:  Recommended Dietary Goals  Cassie Duncan is currently in the action stage of change. As such, her goal is to continue weight management plan. She has agreed to: continue current plan  Behavioral Intervention  We discussed the following Behavioral Modification Strategies today: increasing lean protein intake, decreasing simple carbohydrates , increasing vegetables, increasing lower glycemic fruits, increasing fiber rich foods, increasing water intake, continue to practice mindfulness when eating, planning for success, and staying on track while traveling and vacationing.  Additional resources provided today: None  Recommended Physical Activity Goals  Antonella has been advised to work up to 150 minutes of moderate intensity aerobic activity a week and strengthening exercises 2-3 times per week for cardiovascular health, weight loss maintenance and preservation of muscle mass.   She has agreed to :  Increase volume of physical activity with emphasis on strengthening.  She is currently working with a Marketing executive.  Pharmacotherapy We discussed various medication options to help [name] with her weight loss efforts and we both agreed to : continue current anti-obesity medication regimen  ASSOCIATED CONDITIONS ADDRESSED TODAY  Prediabetes Assessment & Plan: Most recent A1c is  Lab Results  Component Value Date   HGBA1C 5.4 07/08/2022   HGBA1C 5.8 (H) 02/23/2012   And improved.  She is currently on Zepbound 7.5 mg once a week.  She will continue medication    Class 3 severe obesity with serious comorbidity and body mass index (BMI) of 45.0 to 49.9 in adult, unspecified obesity type [facility]) Assessment & Plan: Cassie Duncan has lost 27 pounds.  Her BIA shows  a reduction in body fat percentage improvement in visceral fat rating and preservation of muscle mass.  She is currently on Zepbound 7.5 mg once a week with adequate orexigenic  control.  She does have some fluctuations in satiety which I suspect is related to shifts in macros.  We discussed the role of water, fibrous foods as well as protein not only to increase his satiety but also suppression of ghrelin.  I will like for her to focus on these 3 goals over the next few weeks.  She will continue on incretin therapy at current dose.  Her goal is not to have to rely on medications but she has benefited tremendously due to amplified orixegenic signaling.  She also shares she is frustrated about the rate of weight loss.  We again discussed managing expectations and realizing that gradual and sustainable weight loss is more beneficial than rapid weight loss from very low calorie diets.  I did give her the option to further reduce her calories to 1200 if desired which will be hard for her.   Abnormal food appetite Assessment & Plan: Improving She had increased orexigenic signaling, impaired satiety and inhibitory control. 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  In addition to nutritional and behavioral strategies she benefits from ongoing pharmacotherapy.  She will continue Zepbound 7.5 mg once a week      PHYSICAL EXAM:  Blood pressure 130/83, pulse 79, temperature (!) 97.3 F (36.3 C), height 5\' 4"  (1.626 m), weight 271 lb (122.9 kg), last menstrual period 01/06/2023, SpO2 99%. Body mass index is 46.52 kg/m.  General: She is overweight, cooperative, alert, well developed, and in no acute distress. PSYCH: Has normal mood, affect and thought process.   HEENT: EOMI, sclerae are anicteric. Lungs: Normal breathing effort, no conversational dyspnea. Extremities: No edema.  Neurologic: No gross sensory or motor deficits. No tremors or fasciculations noted.    DIAGNOSTIC DATA REVIEWED:  BMET    Component Value Date/Time   NA 138 03/04/2022 1056   K 4.6 03/04/2022 1056   CL 102  03/04/2022 1056   CO2 23 03/04/2022 1056   GLUCOSE 84 03/04/2022 1056   GLUCOSE 87 09/18/2021 0909   BUN 9 03/04/2022 1056   CREATININE 0.73 03/04/2022 1056   CREATININE 0.79 09/05/2015 1201   CALCIUM 9.5 03/04/2022 1056   GFRNONAA >60 08/31/2021 1821   Lab Results  Component Value Date   HGBA1C 5.4 07/08/2022   HGBA1C 5.8 (H) 02/23/2012   Lab Results  Component Value Date   INSULIN 5.3 02/04/2022   Lab Results  Component Value Date   TSH 1.44 09/02/2021   CBC    Component Value Date/Time   WBC 5.3 03/04/2022 1056   WBC 6.7 09/02/2021 1421   RBC 4.03 03/04/2022 1056   RBC 4.78 09/02/2021 1421   HGB 11.7 03/04/2022 1056   HCT 35.0 03/04/2022 1056   PLT 403.0 (H) 09/02/2021 1421   MCV 87 03/04/2022 1056   MCH 29.0 03/04/2022 1056   MCH 29.4 08/31/2021 1821   MCHC 33.4 03/04/2022 1056   MCHC 33.0 09/02/2021 1421   RDW 12.4 03/04/2022 1056   Iron Studies No results found for: "IRON", "TIBC", "FERRITIN", "IRONPCTSAT" Lipid Panel     Component Value Date/Time   CHOL 161 02/04/2022 0953   TRIG 33 02/04/2022 0953   HDL 47 02/04/2022 0953   CHOLHDL 4 09/02/2021 1421   VLDL 9.4 09/02/2021 1421  LDLCALC 106 (H) 02/04/2022 0953   Hepatic Function Panel     Component Value Date/Time   PROT 7.3 03/04/2022 1056   ALBUMIN 3.9 03/04/2022 1056   AST 16 03/04/2022 1056   ALT 10 03/04/2022 1056   ALKPHOS 65 03/04/2022 1056   BILITOT 0.3 03/04/2022 1056      Component Value Date/Time   TSH 1.44 09/02/2021 1421   Nutritional Lab Results  Component Value Date   VD25OH 47.3 03/04/2022     No follow-ups on file.Marland Kitchen She was informed of the importance of frequent follow up visits to maximize her success with intensive lifestyle modifications for her multiple health conditions.   ATTESTASTION STATEMENTS:  Reviewed by clinician on day of visit: allergies, medications, problem list, medical history, surgical history, family history, social history, and previous  encounter notes.     Worthy Rancher, MD

## 2023-01-13 NOTE — Assessment & Plan Note (Signed)
Most recent A1c is  Lab Results  Component Value Date   HGBA1C 5.4 07/08/2022   HGBA1C 5.8 (H) 02/23/2012   And improved.  She is currently on Zepbound 7.5 mg once a week.  She will continue medication

## 2023-01-13 NOTE — Assessment & Plan Note (Signed)
Improving She had increased orexigenic signaling, impaired satiety and inhibitory control. 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  In addition to nutritional and behavioral strategies she benefits from ongoing pharmacotherapy.  She will continue Zepbound 7.5 mg once a week

## 2023-01-13 NOTE — Assessment & Plan Note (Signed)
Cassie Duncan has lost 27 pounds.  Her BIA shows a reduction in body fat percentage improvement in visceral fat rating and preservation of muscle mass.  She is currently on Zepbound 7.5 mg once a week with adequate orexigenic control.  She does have some fluctuations in satiety which I suspect is related to shifts in macros.  We discussed the role of water, fibrous foods as well as protein not only to increase his satiety but also suppression of ghrelin.  I will like for her to focus on these 3 goals over the next few weeks.  She will continue on incretin therapy at current dose.  Her goal is not to have to rely on medications but she has benefited tremendously due to amplified orixegenic signaling.  She also shares she is frustrated about the rate of weight loss.  We again discussed managing expectations and realizing that gradual and sustainable weight loss is more beneficial than rapid weight loss from very low calorie diets.  I did give her the option to further reduce her calories to 1200 if desired which will be hard for her.

## 2023-01-20 ENCOUNTER — Other Ambulatory Visit (HOSPITAL_COMMUNITY): Payer: Self-pay

## 2023-01-24 ENCOUNTER — Telehealth (INDEPENDENT_AMBULATORY_CARE_PROVIDER_SITE_OTHER): Payer: BC Managed Care – PPO | Admitting: Psychology

## 2023-01-24 DIAGNOSIS — F5089 Other specified eating disorder: Secondary | ICD-10-CM

## 2023-01-24 DIAGNOSIS — F4320 Adjustment disorder, unspecified: Secondary | ICD-10-CM

## 2023-01-24 NOTE — Progress Notes (Signed)
  Office: [phone]  /  Fax: [phone]    Date: January 24, 2023  Duncan Start Time: 10:02am Duration: 40 minutes Provider: Lawerance Cruel, Psy.D. Type of Session: Individual Therapy  Location of Patient:  Hotel in [place], [place]  (address obtained; safe/private location) Location of Provider: Provider's Home (private office) Type of Contact: Telepsychological Visit via MyChart Video Visit  Session Content: Cassie Duncan is a 37 y.o. female presenting for a follow-up Duncan to address the previously established treatment goal of increasing coping skills.Today's Duncan was a telepsychological visit. Cassie Duncan provided verbal consent for today's telepsychological Duncan and she is aware she is responsible for securing confidentiality on her end of the session. Prior to proceeding with today's Duncan, Cassie Duncan's physical location at the time of this Duncan was obtained as well a phone number she could be reached at in the event of technical difficulties. Cassie Duncan and this provider participated in today's telepsychological service.   Of note, today's Duncan was switched to a regular telephone call at 10:36am with Cassie Duncan's verbal consent due to technical issues.   This provider conducted a brief check-in. Cassie Duncan shared about her recent birthday celebrations. She discussed making better choices and engaging in portion control. Positive reinforcement was provided. Notably, Cassie Duncan shared about recent stressors, noting it has not impacted her eating. Cassie Duncan focused on processing the stressors, as she acknowledged stressors in the past have impacted eating habits and overall well-being. Overall, Cassie Duncan was receptive to today's Duncan as evidenced by openness to sharing, responsiveness to feedback, and willingness to continue engaging in learned skills.  Mental Status Examination:  Appearance: neat Behavior: appropriate to  circumstances Mood: neutral Affect: mood congruent Speech: WNL Eye Contact: appropriate Psychomotor Activity: WNL Gait: unable to assess Thought Process: linear, logical, and goal directed and no evidence or endorsement of suicidal, homicidal, and self-harm ideation, plan and intent  Thought Content/Perception: no hallucinations, delusions, bizarre thinking or behavior endorsed or observed Orientation: AAOx4 Memory/Concentration: intact Insight: good Judgment: good  Interventions:  Conducted a brief chart review Provided empathic reflections and validation Provided positive reinforcement Employed supportive psychotherapy interventions to facilitate reduced distress and to improve coping skills with identified stressors Engaged patient in thought challenging/cognitive restructuring   DSM-5 Diagnosis(es):  F50.89 Other Specified Feeding or Eating Disorder, Emotional Eating Behaviors and F43.20 Adjustment Disorder, Unspecified   Treatment Goal & Progress: During the initial Duncan with this provider, the following treatment goal was established: increase coping skills. Cassie Duncan has demonstrated progress in her goal as evidenced by increased awareness of hunger patterns, increased awareness of triggers for emotional eating behaviors, and reduction in emotional eating behaviors . Cassie Duncan also continues to demonstrate willingness to engage in learned skill(s).  Plan: The next Duncan is scheduled for 02/21/2023 at 11:30am, which will be via MyChart Video Visit. The next session will focus on working towards the established treatment goal and termination. Cassie Duncan will continue with her primary therapist.

## 2023-01-28 ENCOUNTER — Other Ambulatory Visit (HOSPITAL_COMMUNITY): Payer: Self-pay

## 2023-02-17 ENCOUNTER — Encounter (INDEPENDENT_AMBULATORY_CARE_PROVIDER_SITE_OTHER): Payer: Self-pay | Admitting: Internal Medicine

## 2023-02-17 ENCOUNTER — Ambulatory Visit (INDEPENDENT_AMBULATORY_CARE_PROVIDER_SITE_OTHER): Payer: BC Managed Care – PPO | Admitting: Internal Medicine

## 2023-02-17 ENCOUNTER — Other Ambulatory Visit (HOSPITAL_COMMUNITY): Payer: Self-pay

## 2023-02-17 VITALS — BP 104/67 | HR 83 | Temp 98.8°F | Ht 65.0 in | Wt 259.0 lb

## 2023-02-17 DIAGNOSIS — E669 Obesity, unspecified: Secondary | ICD-10-CM | POA: Diagnosis not present

## 2023-02-17 DIAGNOSIS — R638 Other symptoms and signs concerning food and fluid intake: Secondary | ICD-10-CM | POA: Diagnosis not present

## 2023-02-17 DIAGNOSIS — R7303 Prediabetes: Secondary | ICD-10-CM

## 2023-02-17 DIAGNOSIS — Z6841 Body Mass Index (BMI) 40.0 and over, adult: Secondary | ICD-10-CM | POA: Diagnosis not present

## 2023-02-17 MED ORDER — ZEPBOUND 7.5 MG/0.5ML ~~LOC~~ SOAJ
7.5000 mg | SUBCUTANEOUS | 0 refills | Status: DC
Start: 2023-02-17 — End: 2023-03-17
  Filled 2023-02-17 – 2023-03-07 (×3): qty 2, 28d supply, fill #0

## 2023-02-17 NOTE — Progress Notes (Signed)
Office: [phone]  /  Fax: [phone]  WEIGHT SUMMARY AND BIOMETRICS  Vitals Temp: 98.8 F (37.1 C) BP: 104/67 Pulse Rate: 83 SpO2: 97 %   Anthropometric Measurements Height: 5\' 5"  (1.651 m) Weight: 259 lb (117.5 kg) BMI (Calculated): 43.1 Weight at Last Visit: 271 lb Weight Lost Since Last Visit: 12 lb Weight Gained Since Last Visit: 0 Starting Weight: 274 lb Total Weight Loss (lbs): 15 lb (6.804 kg) Peak Weight: 312 lb   Body Composition  Body Fat %: 45.7 % Fat Mass (lbs): 118.4 lbs Muscle Mass (lbs): 133.8 lbs Total Body Water (lbs): 96.2 lbs Visceral Fat Rating : 13    No data recorded Today's Visit #: 15  Starting Date: 02/05/23   HPI  Chief Complaint: OBESITY  Cassie Duncan is here to discuss her progress with her obesity treatment plan. She is on the following a lower carbohydrate, vegetable and lean protein rich diet plan and states she is following her eating plan approximately 90 % of the time. She states she is exercising 60 minutes 7 times per week.  Interval History:  Discussed the use of AI scribe software for clinical note transcription with the patient, who gave verbal consent to proceed.  History of Present Illness    The patient presents for follow-up on obesity.  Currently on a weight loss regimen with Zepbound 7.5mg  once a week, presents for a follow-up visit. She reports a weight loss of twelve pounds since the last visit. The patient expresses occasional doubts about the effectiveness of the medication due to persistent hunger cravings. However, she acknowledges that these cravings are part of the weight loss journey and has been managing them better. The patient has considered increasing the medication dosage but has decided to continue with the current dosage for now.  The patient has been engaging in daily physical activity and has noticed an acceleration in her weight loss.  The patient's peak weight was recorded at 297 pounds.  Currently, she weighs 259 pounds, marking a significant weight loss of 38 pounds, which is 13% of her body weight. The patient's body fat percentage has decreased to 45%, and muscle mass has increased, indicating a favorable shift in body composition. The patient acknowledges the positive impact of physical activity on her weight loss journey and is committed to making it a part of her lifestyle.  The patient has been mindful of her eating habits, particularly during celebratory occasions. She expresses a desire to maintain her current momentum and is cautious about potential setbacks during the upcoming holiday season. The patient plans to continue with the current medication dosage and will consider an increase if necessary after the holiday season.        Barriers identified: none.   Pharmacotherapy for weight loss: She is currently taking Zepbound with adequate clinical response  and without side effects..    ASSESSMENT AND PLAN  TREATMENT PLAN FOR OBESITY:  Recommended Dietary Goals  Cassie Duncan is currently in the action stage of change. As such, her goal is to continue weight management plan. She has agreed to: continue current plan  Behavioral Intervention  We discussed the following Behavioral Modification Strategies today: continue to work on maintaining a reduced calorie state, getting the recommended amount of protein, incorporating whole foods, making healthy choices, staying well hydrated and practicing mindfulness when eating..  Additional resources provided today: None  Recommended Physical Activity Goals  Cassie Duncan has been advised to work up to 150 minutes of moderate intensity aerobic activity a  week and strengthening exercises 2-3 times per week for cardiovascular health, weight loss maintenance and preservation of muscle mass.   She has agreed to :  Continue current level of physical activity   Pharmacotherapy We discussed various medication options to help Cassie Duncan  with her weight loss efforts and we both agreed to : continue current anti-obesity medication regimen  ASSOCIATED CONDITIONS ADDRESSED TODAY  Prediabetes Assessment & Plan: Most recent A1c is  Lab Results  Component Value Date   HGBA1C 5.4 07/08/2022   HGBA1C 5.8 (H) 02/23/2012   And improved.  She is currently on Zepbound 7.5 mg once a week without side effects she will continue medication for pharmacoprophylaxis and weight management    Generalized obesity with starting BMI of 52 Assessment & Plan: See obesity treatment plan  Orders: -     Zepbound; Inject 7.5 mg into the skin once a week.  Dispense: 6 mL; Refill: 0  Abnormal food appetite Assessment & Plan: Improved.  She had increased orexigenic signaling, impaired satiety and inhibitory control. 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  In addition to nutritional and behavioral strategies she benefits from ongoing pharmacotherapy.  She will continue Zepbound 7.5 mg once a week as she is having an adequate clinical response    BMI 40.0-44.9, adult (HCC)    PHYSICAL EXAM:  Blood pressure 104/67, pulse 83, temperature 98.8 F (37.1 C), height 5\' 5"  (1.651 m), weight 259 lb (117.5 kg), last menstrual period 02/02/2023, SpO2 97%. Body mass index is 43.1 kg/m.  General: She is overweight, cooperative, alert, well developed, and in no acute distress. PSYCH: Has normal mood, affect and thought process.   HEENT: EOMI, sclerae are anicteric. Lungs: Normal breathing effort, no conversational dyspnea. Extremities: No edema.  Neurologic: No gross sensory or motor deficits. No tremors or fasciculations noted.    DIAGNOSTIC DATA REVIEWED:  BMET    Component Value Date/Time   NA 138 03/04/2022 1056   K 4.6 03/04/2022 1056   CL 102 03/04/2022 1056   CO2 23 03/04/2022 1056   GLUCOSE 84 03/04/2022 1056   GLUCOSE 87 09/18/2021 0909    BUN 9 03/04/2022 1056   CREATININE 0.73 03/04/2022 1056   CREATININE 0.79 09/05/2015 1201   CALCIUM 9.5 03/04/2022 1056   GFRNONAA >60 08/31/2021 1821   Lab Results  Component Value Date   HGBA1C 5.4 07/08/2022   HGBA1C 5.8 (H) 02/23/2012   Lab Results  Component Value Date   INSULIN 5.3 02/04/2022   Lab Results  Component Value Date   TSH 1.44 09/02/2021   CBC    Component Value Date/Time   WBC 5.3 03/04/2022 1056   WBC 6.7 09/02/2021 1421   RBC 4.03 03/04/2022 1056   RBC 4.78 09/02/2021 1421   HGB 11.7 03/04/2022 1056   HCT 35.0 03/04/2022 1056   PLT 403.0 (H) 09/02/2021 1421   MCV 87 03/04/2022 1056   MCH 29.0 03/04/2022 1056   MCH 29.4 08/31/2021 1821   MCHC 33.4 03/04/2022 1056   MCHC 33.0 09/02/2021 1421   RDW 12.4 03/04/2022 1056   Iron Studies No results found for: "IRON", "TIBC", "FERRITIN", "IRONPCTSAT" Lipid Panel     Component Value Date/Time   CHOL 161 02/04/2022 0953   TRIG 33 02/04/2022 0953   HDL 47 02/04/2022 0953   CHOLHDL 4 09/02/2021 1421   VLDL 9.4 09/02/2021 1421   LDLCALC 106 (H) 02/04/2022 0953   Hepatic Function  Panel     Component Value Date/Time   PROT 7.3 03/04/2022 1056   ALBUMIN 3.9 03/04/2022 1056   AST 16 03/04/2022 1056   ALT 10 03/04/2022 1056   ALKPHOS 65 03/04/2022 1056   BILITOT 0.3 03/04/2022 1056      Component Value Date/Time   TSH 1.44 09/02/2021 1421   Nutritional Lab Results  Component Value Date   VD25OH 47.3 03/04/2022     Return in about 4 weeks (around 03/17/2023).Marland Kitchen She was informed of the importance of frequent follow up visits to maximize her success with intensive lifestyle modifications for her multiple health conditions.   ATTESTASTION STATEMENTS:  Reviewed by clinician on day of visit: allergies, medications, problem list, medical history, surgical history, family history, social history, and previous encounter notes.     Worthy Rancher, MD

## 2023-02-17 NOTE — Assessment & Plan Note (Addendum)
Most recent A1c is  Lab Results  Component Value Date   HGBA1C 5.4 07/08/2022   HGBA1C 5.8 (H) 02/23/2012   And improved.  She is currently on Zepbound 7.5 mg once a week without side effects she will continue medication for pharmacoprophylaxis and weight management

## 2023-02-17 NOTE — Assessment & Plan Note (Signed)
 See obesity treatment plan

## 2023-02-17 NOTE — Assessment & Plan Note (Signed)
Improved.  She had increased orexigenic signaling, impaired satiety and inhibitory control. 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  In addition to nutritional and behavioral strategies she benefits from ongoing pharmacotherapy.  She will continue Zepbound 7.5 mg once a week as she is having an adequate clinical response

## 2023-02-21 ENCOUNTER — Telehealth (INDEPENDENT_AMBULATORY_CARE_PROVIDER_SITE_OTHER): Payer: BC Managed Care – PPO | Admitting: Psychology

## 2023-02-21 DIAGNOSIS — F4320 Adjustment disorder, unspecified: Secondary | ICD-10-CM

## 2023-02-21 DIAGNOSIS — F5089 Other specified eating disorder: Secondary | ICD-10-CM

## 2023-02-21 NOTE — Progress Notes (Signed)
  Office: [phone]  /  Fax: [phone]    Date: February 21, 2023  Appointment Start Time: 11:31am Duration: 26 minutes Provider: Lawerance Cruel, Psy.D. Type of Session: Individual Therapy  Location of Patient: Home (private location) Location of Provider: Provider's Home (private office) Type of Contact: Telepsychological Visit via MyChart Video Visit  Session Content: Verdean is a 36 y.o. female presenting for a follow-up appointment to address the previously established treatment goal of increasing coping skills.Today's appointment was a telepsychological visit. Rosalynn provided verbal consent for today's telepsychological appointment and she is aware she is responsible for securing confidentiality on her end of the session. Prior to proceeding with today's appointment, Anny's physical location at the time of this appointment was obtained as well a phone number she could be reached at in the event of technical difficulties. Yi and this provider participated in today's telepsychological service.   This provider conducted a brief check-in. Jarielys shared about weight loss to date and described feeling more in "control." Reflected on progress to date and reviewed learned skills. Overall, Teghan was receptive to today's appointment as evidenced by openness to sharing, responsiveness to feedback, and willingness to continue engaging in learned skills.  Mental Status Examination:  Appearance: neat Behavior: appropriate to circumstances Mood: neutral Affect: mood congruent Speech: WNL Eye Contact: appropriate Psychomotor Activity: WNL Gait: unable to assess Thought Process: linear, logical, and goal directed and no evidence or endorsement of suicidal, homicidal, and self-harm ideation, plan and intent  Thought Content/Perception: no hallucinations, delusions, bizarre thinking or behavior endorsed or observed Orientation: AAOx4 Memory/Concentration: intact Insight:  good Judgment: good  Interventions:  Conducted a brief chart review Provided empathic reflections and validation Provided positive reinforcement Employed supportive psychotherapy interventions to facilitate reduced distress and to improve coping skills with identified stressors Reviewed learned skills  DSM-5 Diagnosis(es):  F50.89 Other Specified Feeding or Eating Disorder, Emotional Eating Behaviors and F43.20 Adjustment Disorder, Unspecified   Treatment Goal & Progress: During the initial appointment with this provider, the following treatment goal was established: increase coping skills. Myja demonstrated progress in her goal as evidenced by increased awareness of hunger patterns, increased awareness of triggers for emotional eating behaviors, and reduction in emotional eating behaviors . Pattyann also continues to demonstrate willingness to engage in learned skill(s).  Plan: As previously planned, today was Cameran's last appointment with this provider. Two additional referral options were provided. She acknowledged understanding that she may request a follow-up appointment with this provider in the future as long as she is still established with the clinic. No further follow-up planned by this provider.

## 2023-03-01 ENCOUNTER — Telehealth (INDEPENDENT_AMBULATORY_CARE_PROVIDER_SITE_OTHER): Payer: Self-pay

## 2023-03-01 ENCOUNTER — Telehealth (INDEPENDENT_AMBULATORY_CARE_PROVIDER_SITE_OTHER): Payer: Self-pay | Admitting: Internal Medicine

## 2023-03-01 ENCOUNTER — Other Ambulatory Visit (HOSPITAL_COMMUNITY): Payer: Self-pay

## 2023-03-01 ENCOUNTER — Encounter (INDEPENDENT_AMBULATORY_CARE_PROVIDER_SITE_OTHER): Payer: Self-pay

## 2023-03-01 NOTE — Telephone Encounter (Signed)
PA for Zebound 7.5 mg started

## 2023-03-01 NOTE — Telephone Encounter (Signed)
Pt called stating that she did not receive her refill of Zepbound. Pt would like the refill sent to her pharmacy today. Please call pt at the number on file.

## 2023-03-02 NOTE — Telephone Encounter (Signed)
Questions for Zepbound sent to insurance via cover my meds.  Awaiting response.

## 2023-03-03 DIAGNOSIS — E7849 Other hyperlipidemia: Secondary | ICD-10-CM | POA: Diagnosis not present

## 2023-03-03 DIAGNOSIS — R002 Palpitations: Secondary | ICD-10-CM | POA: Diagnosis not present

## 2023-03-03 DIAGNOSIS — I10 Essential (primary) hypertension: Secondary | ICD-10-CM | POA: Diagnosis not present

## 2023-03-03 NOTE — Telephone Encounter (Signed)
Your request has been denied Denied. This health benefit plan does not cover the following services, supplies, drugs or charges: Any treatment or regimen, medical or surgical, for the purpose of reducing or controlling the weight of the member, or for the treatment of obesity, except for surgical treatment of morbid obesity, or as specifically covered by this health benefit plan.  Patient has been notified.

## 2023-03-07 ENCOUNTER — Other Ambulatory Visit (HOSPITAL_COMMUNITY): Payer: Self-pay

## 2023-03-17 ENCOUNTER — Encounter (INDEPENDENT_AMBULATORY_CARE_PROVIDER_SITE_OTHER): Payer: Self-pay | Admitting: Internal Medicine

## 2023-03-17 ENCOUNTER — Ambulatory Visit (INDEPENDENT_AMBULATORY_CARE_PROVIDER_SITE_OTHER): Payer: BC Managed Care – PPO | Admitting: Internal Medicine

## 2023-03-17 ENCOUNTER — Other Ambulatory Visit (HOSPITAL_COMMUNITY): Payer: Self-pay

## 2023-03-17 VITALS — BP 111/75 | HR 76 | Temp 98.3°F | Ht 65.0 in | Wt 256.0 lb

## 2023-03-17 DIAGNOSIS — G4733 Obstructive sleep apnea (adult) (pediatric): Secondary | ICD-10-CM | POA: Diagnosis not present

## 2023-03-17 DIAGNOSIS — I10 Essential (primary) hypertension: Secondary | ICD-10-CM

## 2023-03-17 DIAGNOSIS — E669 Obesity, unspecified: Secondary | ICD-10-CM | POA: Diagnosis not present

## 2023-03-17 DIAGNOSIS — K5900 Constipation, unspecified: Secondary | ICD-10-CM

## 2023-03-17 DIAGNOSIS — K5904 Chronic idiopathic constipation: Secondary | ICD-10-CM | POA: Insufficient documentation

## 2023-03-17 DIAGNOSIS — Z6841 Body Mass Index (BMI) 40.0 and over, adult: Secondary | ICD-10-CM

## 2023-03-17 DIAGNOSIS — E66813 Obesity, class 3: Secondary | ICD-10-CM

## 2023-03-17 MED ORDER — ZEPBOUND 10 MG/0.5ML ~~LOC~~ SOAJ
10.0000 mg | SUBCUTANEOUS | 0 refills | Status: DC
Start: 2023-03-17 — End: 2023-04-14
  Filled 2023-03-17 – 2023-04-12 (×2): qty 2, 28d supply, fill #0

## 2023-03-17 NOTE — Progress Notes (Signed)
Office: [phone]  /  Fax: [phone]  Weight Summary And Biometrics  Vitals Temp: 98.3 F (36.8 C) BP: 111/75 Pulse Rate: 76 SpO2: 100 %   Anthropometric Measurements Height: 5\' 5"  (1.651 m) Weight: 256 lb (116.1 kg) BMI (Calculated): 42.6 Weight at Last Visit: 259 lb Weight Lost Since Last Visit: 3 lb Weight Gained Since Last Visit: 0 lb Starting Weight: 274 lb Total Weight Loss (lbs): 18 lb (8.165 kg) Peak Weight: 312 lb   Body Composition  Body Fat %: 48.3 % Fat Mass (lbs): 123.6 lbs Muscle Mass (lbs): 125.8 lbs Total Body Water (lbs): 95 lbs Visceral Fat Rating : 14    No data recorded Today's Visit #: 16  Starting Date: 02/05/23   Subjective   Chief Complaint: Obesity  Shaney is here to discuss her progress with her obesity treatment plan. She is on the following a lower carbohydrate, vegetable and lean protein rich diet plan and states she is following her eating plan approximately 90 % of the time. She states she is exercising 50-75 minutes 7 times per week.  Interval History:   Discussed the use of AI scribe software for clinical note transcription with the patient, who gave verbal consent to proceed.  Discussed the use of AI scribe software for clinical note transcription with the patient, who gave verbal consent to proceed.  History of Present Illness   The patient, affected by obesity, high blood pressure, and obstructive sleep apnea, presents for a weight management consultation. She has a history of prediabetes and abnormal food appetite, which have shown improvement. Since the last visit, the patient has lost three pounds and is currently on Zepbound.  The patient reports an increase in hunger signals, particularly on days of strenuous workouts, suggesting a possible correlation with increased caloric burn. She also notes an increase in hunger towards the end of the week, prior to her next dose of medication, and during the week leading  up to her menstrual cycle.  The patient has a long-standing issue with constipation, which she manages with smooth tea and occasional use of Dulcolax and Miralax. She reports that constipation has been a problem since childhood and has been on Miralax since high school.   The patient is also managing obstructive sleep apnea (OSA) with a CPAP machine and reports experiencing heart flutters, which her cardiologist attributes to the OSA. She has lost 26 pounds since her last cardiology appointment, and her EKG results were normal.   She has been successful in managing her hunger and maintaining her weight loss, but is considering increasing her dose of Subbound to manage increased hunger signals. She is also considering increasing her fiber intake to manage constipation.        Orexigenic Control:  Reports problems with appetite and hunger signals.  Reports problems with satiety and satiation.  Denies problems with eating patterns and portion control.  Denies abnormal cravings. Denies feeling deprived or restricted.   Barriers identified: strong hunger signals and impaired satiety / inhibitory control.   Pharmacotherapy for weight loss: She is currently taking Zepbound with adequate clinical response  and without side effects..   Assessment and Plan   Treatment Plan For Obesity:  Recommended Dietary Goals  Cassie Duncan is currently in the action stage of change. As such, her goal is to continue weight management plan. She has agreed to: continue current plan  Behavioral Intervention  We discussed the following Behavioral Modification Strategies today: continue to work on maintaining a reduced calorie  state, getting the recommended amount of protein, incorporating whole foods, making healthy choices, staying well hydrated and practicing mindfulness when eating..  Additional resources provided today: Handout on traveling and holiday eating strategies  Recommended Physical Activity  Goals  Wesleigh has been advised to work up to 150 minutes of moderate intensity aerobic activity a week and strengthening exercises 2-3 times per week for cardiovascular health, weight loss maintenance and preservation of muscle mass.   She has agreed to :  Continue current level of physical activity  and continue to gradually increase the amount and intensity of exercise   Pharmacotherapy  We discussed various medication options to help Meya with her weight loss efforts and we both agreed to : increase Zepbound to 10 mg once weekly.   Associated Conditions Addressed Today  Assessment and Plan     Obesity   She presents for weight management, having lost three pounds since the last visit. She reports increased hunger signals, especially on days with strenuous workouts and towards the end of the week before the next dose of Subbound. After discussing the physiological nature of hunger due to increased physical activity and the potential waning effect of medication, informed consent was provided to increase the Zepbound dose to 10 mg. This increase aims to improve hunger control but may raise the risk of constipation. We will provide a handout on holiday eating tips, advise increasing daily caloric intake by 100-200 calories on days of increased physical activity, ensure a protein intake of 30-40 grams per meal, and schedule a follow-up in four weeks.  She will also work on increasing fibrous foods.  Constipation   She reports chronic constipation, managed with smooth tea and occasional use of Dulcolax and Miralax since high school. We discussed the potential for colonic dysenergy due to long-term laxative use and recommended increasing dietary fiber and hydration. Informed consent was provided regarding the risks of long-term laxative use and the benefits of natural fiber intake. We will increase dietary fiber intake with kiwis, Benefiber, and bran cereal, ensure at least 90 ounces of water  daily, consider using Metamucil or Benefiber, avoid daily use of laxatives, and discuss potential IBS-C with a primary care provider or gastroenterologist.  Obstructive Sleep Apnea (OSA)   She reports using a CPAP machine and experiencing heart flutters, which the cardiologist attributes to sleep apnea. A recent EKG was normal.  Continue PAP therapy.  Losing 15% of body weight may reduce AHI  Hypertension   Her blood pressure and heart rate are well-controlled, with a recent cardiology visit showing a 26-pound weight loss and a normal EKG. We will monitor blood pressure regularly and continue the current management.  General Health Maintenance   She is due for labs in November and needs to schedule a physical with a new primary care provider. We will order labs during the next visit (come in fasting) and assist in finding a new primary care provider.  Follow-up   We will schedule a follow-up appointment in four weeks.          Objective   Physical Exam:  Blood pressure 111/75, pulse 76, temperature 98.3 F (36.8 C), height 5\' 5"  (1.651 m), weight 256 lb (116.1 kg), last menstrual period 02/24/2023, SpO2 100%. Body mass index is 42.6 kg/m.  General: She is overweight, cooperative, alert, well developed, and in no acute distress. PSYCH: Has normal mood, affect and thought process.   HEENT: EOMI, sclerae are anicteric. Lungs: Normal breathing effort, no conversational dyspnea. Extremities:  No edema.  Neurologic: No gross sensory or motor deficits. No tremors or fasciculations noted.    Diagnostic Data Reviewed:  BMET    Component Value Date/Time   NA 138 03/04/2022 1056   K 4.6 03/04/2022 1056   CL 102 03/04/2022 1056   CO2 23 03/04/2022 1056   GLUCOSE 84 03/04/2022 1056   GLUCOSE 87 09/18/2021 0909   BUN 9 03/04/2022 1056   CREATININE 0.73 03/04/2022 1056   CREATININE 0.79 09/05/2015 1201   CALCIUM 9.5 03/04/2022 1056   GFRNONAA >60 08/31/2021 1821   Lab Results   Component Value Date   HGBA1C 5.4 07/08/2022   HGBA1C 5.8 (H) 02/23/2012   Lab Results  Component Value Date   INSULIN 5.3 02/04/2022   Lab Results  Component Value Date   TSH 1.44 09/02/2021   CBC    Component Value Date/Time   WBC 5.3 03/04/2022 1056   WBC 6.7 09/02/2021 1421   RBC 4.03 03/04/2022 1056   RBC 4.78 09/02/2021 1421   HGB 11.7 03/04/2022 1056   HCT 35.0 03/04/2022 1056   PLT 403.0 (H) 09/02/2021 1421   MCV 87 03/04/2022 1056   MCH 29.0 03/04/2022 1056   MCH 29.4 08/31/2021 1821   MCHC 33.4 03/04/2022 1056   MCHC 33.0 09/02/2021 1421   RDW 12.4 03/04/2022 1056   Iron Studies No results found for: "IRON", "TIBC", "FERRITIN", "IRONPCTSAT" Lipid Panel     Component Value Date/Time   CHOL 161 02/04/2022 0953   TRIG 33 02/04/2022 0953   HDL 47 02/04/2022 0953   CHOLHDL 4 09/02/2021 1421   VLDL 9.4 09/02/2021 1421   LDLCALC 106 (H) 02/04/2022 0953   Hepatic Function Panel     Component Value Date/Time   PROT 7.3 03/04/2022 1056   ALBUMIN 3.9 03/04/2022 1056   AST 16 03/04/2022 1056   ALT 10 03/04/2022 1056   ALKPHOS 65 03/04/2022 1056   BILITOT 0.3 03/04/2022 1056      Component Value Date/Time   TSH 1.44 09/02/2021 1421   Nutritional Lab Results  Component Value Date   VD25OH 47.3 03/04/2022    Follow-Up   Return in about 3 weeks (around 04/07/2023) for For Weight Mangement with [name].Marland Kitchen She was informed of the importance of frequent follow up visits to maximize her success with intensive lifestyle modifications for her multiple health conditions.  Attestation Statement   Reviewed by clinician on day of visit: allergies, medications, problem list, medical history, surgical history, family history, social history, and previous encounter notes.     Worthy Rancher, MD

## 2023-03-29 ENCOUNTER — Other Ambulatory Visit (HOSPITAL_COMMUNITY): Payer: Self-pay

## 2023-04-12 ENCOUNTER — Other Ambulatory Visit (HOSPITAL_COMMUNITY): Payer: Self-pay

## 2023-04-14 ENCOUNTER — Encounter (INDEPENDENT_AMBULATORY_CARE_PROVIDER_SITE_OTHER): Payer: Self-pay | Admitting: Internal Medicine

## 2023-04-14 ENCOUNTER — Ambulatory Visit (INDEPENDENT_AMBULATORY_CARE_PROVIDER_SITE_OTHER): Payer: BC Managed Care – PPO | Admitting: Internal Medicine

## 2023-04-14 ENCOUNTER — Other Ambulatory Visit (HOSPITAL_COMMUNITY): Payer: Self-pay

## 2023-04-14 ENCOUNTER — Ambulatory Visit: Payer: BC Managed Care – PPO | Admitting: Primary Care

## 2023-04-14 ENCOUNTER — Encounter: Payer: Self-pay | Admitting: Primary Care

## 2023-04-14 VITALS — BP 124/72 | HR 86 | Temp 97.8°F | Ht 64.0 in | Wt 259.4 lb

## 2023-04-14 DIAGNOSIS — I10 Essential (primary) hypertension: Secondary | ICD-10-CM

## 2023-04-14 DIAGNOSIS — G4733 Obstructive sleep apnea (adult) (pediatric): Secondary | ICD-10-CM

## 2023-04-14 DIAGNOSIS — E669 Obesity, unspecified: Secondary | ICD-10-CM

## 2023-04-14 DIAGNOSIS — G4730 Sleep apnea, unspecified: Secondary | ICD-10-CM | POA: Diagnosis not present

## 2023-04-14 DIAGNOSIS — R7303 Prediabetes: Secondary | ICD-10-CM

## 2023-04-14 DIAGNOSIS — E66813 Obesity, class 3: Secondary | ICD-10-CM

## 2023-04-14 DIAGNOSIS — J4520 Mild intermittent asthma, uncomplicated: Secondary | ICD-10-CM | POA: Diagnosis not present

## 2023-04-14 DIAGNOSIS — Z6841 Body Mass Index (BMI) 40.0 and over, adult: Secondary | ICD-10-CM

## 2023-04-14 MED ORDER — ZEPBOUND 10 MG/0.5ML ~~LOC~~ SOAJ
10.0000 mg | SUBCUTANEOUS | 1 refills | Status: DC
Start: 2023-04-14 — End: 2023-06-23
  Filled 2023-04-14 – 2023-05-16 (×2): qty 2, 28d supply, fill #0

## 2023-04-14 MED ORDER — AIRSUPRA 90-80 MCG/ACT IN AERO
2.0000 | INHALATION_SPRAY | RESPIRATORY_TRACT | 1 refills | Status: AC | PRN
Start: 2023-04-14 — End: ?
  Filled 2023-04-14 – 2023-05-16 (×2): qty 10.7, 30d supply, fill #0

## 2023-04-14 NOTE — Patient Instructions (Addendum)
-MILD INTERMITTENT ASTHMA: Asthma is a condition where your airways narrow and swell, making it difficult to breathe and can cause chest tightnesss. We will start you on AirSupra (budesonide and albuterol) to use as needed, especially before exercise. Please remember to rinse your mouth after using the inhaler to prevent any fungal infections.  -SLEEP APNEA: Sleep apnea is a condition where your breathing repeatedly stops and starts during sleep. Your sleep apnea is well controlled on currently settings. We recommend continuing with your current CPAP settings.  -POSSIBLE GASTROESOPHAGEAL REFLUX DISEASE (GERD): GERD is a condition where stomach acid frequently flows back into the tube connecting your mouth and stomach, causing discomfort. Your chest heaviness might be related to reflux, so consider following a reflux diet to help manage your symptoms.  -STRESS: High levels of stress can affect your overall health and may contribute to your chest discomfort. We recommend exploring stress management techniques to help reduce your stress levels.  -GENERAL HEALTH MAINTENANCE: For your overall health, consider getting a flu vaccination and continue your weight loss efforts, which have been excellent so far.  Follow-up 6-12 months with Beth NP or sooner   Please continue monitoring your symptoms and how you respond to the new asthma medication. If your symptoms persist or worsen, consider further evaluation. Follow up with [place] if you have any concerns or need adjustments to your treatment plan.   Asthma, Adult  Asthma is a condition that causes swelling and narrowing of the airways. These are the passages that lead from the nose and mouth down into the lungs. When asthma symptoms get worse it is called an asthma attack or flare. This can make it hard to breathe. Asthma flares can range from minor to life-threatening. There is no cure for asthma, but medicines and lifestyle changes can help to control  it. What are the causes? It is not known exactly what causes asthma, but certain things can cause asthma symptoms to get worse (triggers). What can trigger an asthma attack? Cigarette smoke. Mold. Dust. Your pet's skin flakes (dander). Cockroaches. Pollen. Air pollution (like household cleaners, wood smoke, smog, or Therapist, occupational). What are the signs or symptoms? Trouble breathing (shortness of breath). Coughing. Making high-pitched whistling sounds when you breathe, most often when you breathe out (wheezing). Chest tightness. Tiredness with little activity. Poor exercise tolerance. How is this treated? Controller medicines that help prevent asthma symptoms. Fast-acting reliever or rescue medicines. These give short-term relief of asthma symptoms. Allergy medicines if your attacks are brought on by allergens. Medicines to help control the body's defense (immune) system. Staying away from the things that cause asthma attacks. Follow these instructions at home: Avoiding triggers in your home Do not allow anyone to smoke in your home. Limit use of fireplaces and wood stoves. Get rid of pests (such as roaches and mice) and their droppings. Keep your home clean. Clean your floors. Dust regularly. Use cleaning products that do not smell. Wash bed sheets and blankets every week in hot water. Dry them in a dryer. Have someone vacuum when you are not home. Change your heating and air conditioning filters often. Use blankets that are made of polyester or cotton. General instructions Take over-the-counter and prescription medicines only as told by your doctor. Do not smoke or use any products that contain nicotine or tobacco. If you need help quitting, ask your doctor. Stay away from secondhand smoke. Avoid doing things outdoors when allergen counts are high and when air quality is low. Warm  up before you exercise. Take time to cool down after exercise. Use a peak flow meter as told by  your doctor. A peak flow meter is a tool that measures how well your lungs are working. Keep track of the peak flow meter's readings. Write them down. Follow your asthma action plan. This is a written plan for taking care of your asthma and treating your attacks. Make sure you get all the shots (vaccines) that your doctor recommends. Ask your doctor about a flu shot and a pneumonia shot. Keep all follow-up visits. Contact a doctor if: You have wheezing, shortness of breath, or a cough even while taking medicine to prevent attacks. The mucus you cough up (sputum) is thicker than usual. The mucus you cough up changes from clear or white to yellow, green, gray, or is bloody. You have problems from the medicine you are taking, such as: A rash. Itching. Swelling. Trouble breathing. You need reliever medicines more than 2-3 times a week. Your peak flow reading is still at 50-79% of your personal best after following the action plan for 1 hour. You have a fever. Get help right away if: You seem to be worse and are not responding to medicine during an asthma attack. You are short of breath even at rest. You get short of breath when doing very little activity. You have trouble eating, drinking, or talking. You have chest pain or tightness. You have a fast heartbeat. Your lips or fingernails start to turn blue. You are light-headed or dizzy, or you faint. Your peak flow is less than 50% of your personal best. You feel too tired to breathe normally. These symptoms may be an emergency. Get help right away. Call 911. Do not wait to see if the symptoms will go away. Do not drive yourself to the hospital. Summary Asthma is a long-term (chronic) condition in which the airways get tight and narrow. An asthma attack can make it hard to breathe. Asthma cannot be cured, but medicines and lifestyle changes can help control it. Make sure you understand how to avoid triggers and how and when to use your  medicines. Avoid things that can cause allergy symptoms (allergens). These include animal skin flakes (dander) and pollen from trees or grass. Avoid things that pollute the air. These may include household cleaners, wood smoke, smog, or chemical odors. This information is not intended to replace advice given to you by your health care provider. Make sure you discuss any questions you have with your health care provider. Document Revised: 01/26/2021 Document Reviewed: 01/26/2021 Elsevier Patient Education  2024 [employer]   Food Choices for Gastroesophageal Reflux Disease, Adult When you have gastroesophageal reflux disease (GERD), the foods you eat and your eating habits are very important. Choosing the right foods can help ease your discomfort. Think about working with a food expert (dietitian) to help you make good choices. What are tips for following this plan? Reading food labels Look for foods that are low in saturated fat. Foods that may help with your symptoms include: Foods that have less than 5% of daily value (DV) of fat. Foods that have 0 grams of trans fat. Cooking Do not fry your food. Cook your food by baking, steaming, grilling, or broiling. These are all methods that do not need a lot of fat for cooking. To add flavor, try to use herbs that are low in spice and acidity. Meal planning  Choose healthy foods that are low in fat, such as: Fruits  and vegetables. Whole grains. Low-fat dairy products. Lean meats, fish, and poultry. Eat small meals often instead of eating 3 large meals each day. Eat your meals slowly in a place where you are relaxed. Avoid bending over or lying down until 2-3 hours after eating. Limit high-fat foods such as fatty meats or fried foods. Limit your intake of fatty foods, such as oils, butter, and shortening. Avoid the following as told by your doctor: Foods that cause symptoms. These may be different for different people. Keep a food diary to  keep track of foods that cause symptoms. Alcohol. Drinking a lot of liquid with meals. Eating meals during the 2-3 hours before bed. Lifestyle Stay at a healthy weight. Ask your doctor what weight is healthy for you. If you need to lose weight, work with your doctor to do so safely. Exercise for at least 30 minutes on 5 or more days each week, or as told by your doctor. Wear loose-fitting clothes. Do not smoke or use any products that contain nicotine or tobacco. If you need help quitting, ask your doctor. Sleep with the head of your bed higher than your feet. Use a wedge under the mattress or blocks under the bed frame to raise the head of the bed. Chew sugar-free gum after meals. What foods should eat?  Eat a healthy, well-balanced diet of fruits, vegetables, whole grains, low-fat dairy products, lean meats, fish, and poultry. Each person is different. Foods that may cause symptoms in one person may not cause any symptoms in another person. Work with your doctor to find foods that are safe for you. The items listed above may not be a complete list of what you can eat and drink. Contact a food expert for more options. What foods should I avoid? Limiting some of these foods may help in managing the symptoms of GERD. Everyone is different. Talk with a food expert or your doctor to help you find the exact foods to avoid, if any. Fruits Any fruits prepared with added fat. Any fruits that cause symptoms. For some people, this may include citrus fruits, such as oranges, grapefruit, pineapple, and lemons. Vegetables Deep-fried vegetables. [place] fries. Any vegetables prepared with added fat. Any vegetables that cause symptoms. For some people, this may include tomatoes and tomato products, chili peppers, onions and garlic, and horseradish. Grains Pastries or quick breads with added fat. Meats and other proteins High-fat meats, such as fatty beef or pork, hot dogs, ribs, ham, sausage, salami, and  bacon. Fried meat or protein, including fried fish and fried chicken. Nuts and nut butters, in large amounts. Dairy Whole milk and chocolate milk. Sour cream. Cream. Ice cream. Cream cheese. Milkshakes. Fats and oils Butter. Margarine. Shortening. Ghee. Beverages Coffee and tea, with or without caffeine. Carbonated beverages. Sodas. Energy drinks. Fruit juice made with acidic fruits, such as orange or grapefruit. Tomato juice. Alcoholic drinks. Sweets and desserts Chocolate and cocoa. Donuts. Seasonings and condiments Pepper. Peppermint and spearmint. Added salt. Any condiments, herbs, or seasonings that cause symptoms. For some people, this may include curry, hot sauce, or vinegar-based salad dressings. The items listed above may not be a complete list of what you should not eat and drink. Contact a food expert for more options. Questions to ask your doctor Diet and lifestyle changes are often the first steps that are taken to manage symptoms of GERD. If diet and lifestyle changes do not help, talk with your doctor about taking medicines. Where to find more information  [employer]: aboutgerd.org Summary When you have GERD, food and lifestyle choices are very important in easing your symptoms. Eat small meals often instead of 3 large meals a day. Eat your meals slowly and in a place where you are relaxed. Avoid bending over or lying down until 2-3 hours after eating. Limit high-fat foods such as fatty meats or fried foods. This information is not intended to replace advice given to you by your health care provider. Make sure you discuss any questions you have with your health care provider. Document Revised: 10/29/2019 Document Reviewed: 10/29/2019 Elsevier Patient Education  2024 [employer]  Influenza Vaccine Injection What is this medication? INFLUENZA VACCINE (in floo EN zuh vak SEEN) reduces the risk of the influenza (flu). It does not  treat influenza. It is still possible to get influenza after receiving this vaccine, but the symptoms may be less severe or not last as long. It works by helping your immune system learn how to fight off a future infection. This medicine may be used for other purposes; ask your health care provider or pharmacist if you have questions. COMMON BRAND NAME(S): Afluria Quadrivalent, FLUAD Quadrivalent, Fluarix Quadrivalent, Flublok Quadrivalent, FLUCELVAX Quadrivalent, Flulaval Quadrivalent, Fluzone Quadrivalent What should I tell my care team before I take this medication? They need to know if you have any of these conditions: Bleeding disorder like hemophilia Fever or infection Guillain-Barre syndrome or other neurological problems Immune system problems Infection with the human immunodeficiency virus (HIV) or AIDS Low blood platelet counts Multiple sclerosis An unusual or allergic reaction to influenza virus vaccine, latex, other medications, foods, dyes, or preservatives. Different brands of vaccines contain different allergens. Some may contain latex or eggs. Talk to your care team about your allergies to make sure that you get the right vaccine. Pregnant or trying to get pregnant Breastfeeding How should I use this medication? This vaccine is injected into a muscle or under the skin. It is given by your care team. A copy of Vaccine Information Statements will be given before each vaccination. Be sure to read this sheet carefully each time. This sheet may change often. Talk to your care team to see which vaccines are right for you. Some vaccines should not be used in all age groups. Overdosage: If you think you have taken too much of this medicine contact a poison control center or emergency room at once. NOTE: This medicine is only for you. Do not share this medicine with others. What if I miss a dose? This does not apply. What may interact with this medication? Certain medications that  lower your immune system, such as etanercept, anakinra, infliximab, adalimumab Certain medications that prevent or treat blood clots, such as warfarin Chemotherapy or radiation therapy Phenytoin Steroid medications, such as prednisone or cortisone Theophylline Vaccines This list may not describe all possible interactions. Give your health care provider a list of all the medicines, herbs, non-prescription drugs, or dietary supplements you use. Also tell them if you smoke, drink alcohol, or use illegal drugs. Some items may interact with your medicine. What should I watch for while using this medication? Report any side effects that do not go away with your care team. Call your care team if any unusual symptoms occur within 6 weeks of receiving this vaccine. You may still catch the flu, but the illness is not usually as bad. You cannot get the flu from the vaccine. The vaccine will not protect against colds or other illnesses that may  cause fever. The vaccine is needed every year. What side effects may I notice from receiving this medication? Side effects that you should report to your care team as soon as possible: Allergic reactions--skin rash, itching, hives, swelling of the face, lips, tongue, or throat Side effects that usually do not require medical attention (report these to your care team if they continue or are bothersome): Chills Fatigue Headache Joint pain Loss of appetite Muscle pain Nausea Pain, redness, or irritation at injection site This list may not describe all possible side effects. Call your doctor for medical advice about side effects. You may report side effects to FDA at 1-800-FDA-1088. Where should I keep my medication? The vaccine is only given by your care team. It will not be stored at home. NOTE: This sheet is a summary. It may not cover all possible information. If you have questions about this medicine, talk to your doctor, pharmacist, or health care provider.   2024 Elsevier/Gold Standard (2021-09-29 00:00:00)

## 2023-04-14 NOTE — Progress Notes (Signed)
Office: [phone]  /  Fax: [phone]  Weight Summary And Biometrics  Vitals Temp: 97.8 F (36.6 C) BP: 124/77 Pulse Rate: 79 SpO2: 100 %   Anthropometric Measurements Height: 5\' 5"  (1.651 m) Weight: 254 lb (115.2 kg) BMI (Calculated): 42.27 Weight at Last Visit: 256 lb Weight Lost Since Last Visit: 2 lb Weight Gained Since Last Visit: 0 lb Starting Weight: 274 lb Total Weight Loss (lbs): 20 lb (9.072 kg) Peak Weight: 312 lb   Body Composition  Body Fat %: 50.1 % Fat Mass (lbs): 127.6 lbs Muscle Mass (lbs): 120.6 lbs Total Body Water (lbs): 97 lbs Visceral Fat Rating : 14    No data recorded Today's Visit #: 17  Starting Date: 02/05/23   Subjective   Chief Complaint: Obesity  Cassie Duncan is here to discuss her progress with her obesity treatment plan. She is on the low carb and states she is following her eating plan approximately 90 % of the time. She states she is exercising 60 minutes 7 times per week.  Interval History:   Discussed the use of AI scribe software for clinical note transcription with the patient, who gave verbal consent to proceed.  History of Present Illness   The patient, affected by obesity, presents for medical weight management. She reports a recent increase in weight after indulging in carbohydrates during the Thanksgiving holiday, noting a significant sensitivity to carbohydrates that leads to rapid weight gain. Despite this, she has been successful in losing weight overall, attributing this success to her medication regimen and regular exercise.  The patient has recently started a 10mg  dose of Zepbound, with no adverse effects reported so far. She expresses confidence in her ability to tolerate this medication .  The patient has been diagnosed with fibroids and experiences significant water retention and cravings for sweets and salty foods during her menstrual cycle. She reports that her weight can fluctuate by up to 10 pounds due  to water retention during this time.  Despite these health concerns, the patient maintains an active lifestyle, attending the gym daily. She also reports using a CPAP machine for moderate sleep apnea, with recent data suggesting good control of her sleep apnea.       Orexigenic Control:  Reports problems with appetite and hunger signals.  Denies problems with satiety and satiation.  Denies problems with eating patterns and portion control.  Reports abnormal cravings. Denies feeling deprived or restricted.   Barriers identified: multiple competing priorities, strong hunger signals and impaired satiety / inhibitory control, and moderate to high levels of stress.   Pharmacotherapy for weight loss: She is currently taking Zepbound with adequate clinical response  and without side effects..   Assessment and Plan   Treatment Plan For Obesity:  Recommended Dietary Goals  Cassie Duncan is currently in the action stage of change. As such, her goal is to continue weight management plan. She has agreed to: continue current plan  Behavioral Intervention  We discussed the following Behavioral Modification Strategies today: continue to work on maintaining a reduced calorie state, getting the recommended amount of protein, incorporating whole foods, making healthy choices, staying well hydrated and practicing mindfulness when eating..  Additional resources provided today: None  Recommended Physical Activity Goals  Cassie Duncan has been advised to work up to 150 minutes of moderate intensity aerobic activity a week and strengthening exercises 2-3 times per week for cardiovascular health, weight loss maintenance and preservation of muscle mass.   She has agreed to :  continue to  gradually increase the amount and intensity of exercise routine  Pharmacotherapy  We discussed various medication options to help Cassie Duncan with her weight loss efforts and we both agreed to : continue current anti-obesity  medication regimen  Associated Conditions Addressed Today  Assessment and Plan    Obesity   She presents for medical weight management, currently on a 10 mg of Zepbound started yesterday, compliant with daily exercise, and carb-sensitive with significant weight fluctuations. We discussed a balanced diet, protein intake, and managing carbohydrate consumption, informed her about a potential medication dosage increase after four weeks, and emphasized the benefits of weight loss on overall health and avoiding high-calorie, carb-dense foods to prevent cravings. We will continue the weight loss medication at 10 mg, send a refill for 10 mg to [facility], monitor weight and dietary intake, and schedule a follow-up appointment in January.  Prediabetes Currently on Zepbound for pharmacoprophylaxis.  She is due for disease monitoring labs and these will be completed fasting in January.  Hypertension Blood pressure is well-controlled.  On labetalol which may cause weight gain.  Menstrual-related symptoms   She reports significant water retention and cravings for sweets and salty foods during her menstrual cycle, with fibroids possibly exacerbating symptoms. We discussed potential medication to manage water retention and cravings, will consider medication for these issues, and monitor symptoms and adjust treatment as necessary.  Consider use of spironolactone as needed   Sleep Apnea   Moderate sleep apnea is managed with CPAP, with recent evaluation indicating control. She reports 6-7 hours of sleep most nights. We discussed monitoring sleep quality and duration, will continue using the CPAP machine, and monitor sleep quality and duration.   General Health Maintenance   We discussed a balanced diet, regular exercise, and weight monitoring, emphasized avoiding high-calorie, carb-dense foods to prevent cravings, will maintain a balanced diet with a focus on protein and whole grains, continue a regular  exercise regimen, and avoid high-calorie, carb-dense foods at home.  Follow-up   We will schedule a follow-up appointment in January and return for fasting blood work at a later date.          Objective   Physical Exam:  Blood pressure 124/77, pulse 79, temperature 97.8 F (36.6 C), height 5\' 5"  (1.651 m), weight 254 lb (115.2 kg), last menstrual period 04/14/2023, SpO2 100%. Body mass index is 42.27 kg/m.  General: She is overweight, cooperative, alert, well developed, and in no acute distress. PSYCH: Has normal mood, affect and thought process.   HEENT: EOMI, sclerae are anicteric. Lungs: Normal breathing effort, no conversational dyspnea. Extremities: No edema.  Neurologic: No gross sensory or motor deficits. No tremors or fasciculations noted.    Diagnostic Data Reviewed:  BMET    Component Value Date/Time   NA 138 03/04/2022 1056   K 4.6 03/04/2022 1056   CL 102 03/04/2022 1056   CO2 23 03/04/2022 1056   GLUCOSE 84 03/04/2022 1056   GLUCOSE 87 09/18/2021 0909   BUN 9 03/04/2022 1056   CREATININE 0.73 03/04/2022 1056   CREATININE 0.79 09/05/2015 1201   CALCIUM 9.5 03/04/2022 1056   GFRNONAA >60 08/31/2021 1821   Lab Results  Component Value Date   HGBA1C 5.4 07/08/2022   HGBA1C 5.8 (H) 02/23/2012   Lab Results  Component Value Date   INSULIN 5.3 02/04/2022   Lab Results  Component Value Date   TSH 1.44 09/02/2021   CBC    Component Value Date/Time   WBC 5.3 03/04/2022 1056  WBC 6.7 09/02/2021 1421   RBC 4.03 03/04/2022 1056   RBC 4.78 09/02/2021 1421   HGB 11.7 03/04/2022 1056   HCT 35.0 03/04/2022 1056   PLT 403.0 (H) 09/02/2021 1421   MCV 87 03/04/2022 1056   MCH 29.0 03/04/2022 1056   MCH 29.4 08/31/2021 1821   MCHC 33.4 03/04/2022 1056   MCHC 33.0 09/02/2021 1421   RDW 12.4 03/04/2022 1056   Iron Studies No results found for: "IRON", "TIBC", "FERRITIN", "IRONPCTSAT" Lipid Panel     Component Value Date/Time   CHOL 161 02/04/2022  0953   TRIG 33 02/04/2022 0953   HDL 47 02/04/2022 0953   CHOLHDL 4 09/02/2021 1421   VLDL 9.4 09/02/2021 1421   LDLCALC 106 (H) 02/04/2022 0953   Hepatic Function Panel     Component Value Date/Time   PROT 7.3 03/04/2022 1056   ALBUMIN 3.9 03/04/2022 1056   AST 16 03/04/2022 1056   ALT 10 03/04/2022 1056   ALKPHOS 65 03/04/2022 1056   BILITOT 0.3 03/04/2022 1056      Component Value Date/Time   TSH 1.44 09/02/2021 1421   Nutritional Lab Results  Component Value Date   VD25OH 47.3 03/04/2022    Follow-Up   No follow-ups on file.Marland Kitchen She was informed of the importance of frequent follow up visits to maximize her success with intensive lifestyle modifications for her multiple health conditions.  Attestation Statement   Reviewed by clinician on day of visit: allergies, medications, problem list, medical history, surgical history, family history, social history, and previous encounter notes.     Worthy Rancher, MD

## 2023-04-14 NOTE — Progress Notes (Signed)
@Patient  ID: Cassie Duncan, female    DOB: 30-Jun-1985, 37 y.o.   MRN: [id number]  Chief Complaint  Patient presents with   Follow-up    Wearing CPAP, discuss  changes, occass. Pr. In upper chest, denies sob    Referring provider: Dulce Sellar, NP  HPI: 37 year old female, never smoked.  Past medical history significant for hypertension, OSA, morbid obesity, palpitations.  09/25/2021 Patient presents today for sleep consult.  She has symptoms of loud snoring. Dx with hypertension in April 2023. Following with cardiology for tachycardia. She has hx sleep apnea.  She was being followed by [facility] for OSA, last seen in January 2023. She had sleep study on 06/18/20 that showed moderate OSA, AHI 21.4/hr. with SpO2 low 87%. Started on CPAP in January 2023. She is working on weight loss.   She is not feeling like her self. Reports morning chest tightness and shallow breathing/dyspnea on exertion. She is working on weight loss. Takes daily walks. Tired all the time. Cardiology started her on labetalol 50mg  twice daily and Amlodipine 2.5mg  daily. Denies nocturnal cough.    Airview download 06/07/21 - 09/24/2021 43/90 days (40%); 24 days (27%) greater than 4 hours Average usage days used 4 hours 43 minutes Pressure 5 to 15 cm H2O (8.1 cm H2O-95%) Air leaks 28.2 L/min (95%) AHI 0.6  Sleep questionnaire Symptoms- Hx sleep apnea, morning chest tightness, tension headache, fatigue Prior sleep study- Feb 2022 Bedtime- 10pm-12am Time to fall asleep- 30-59mins  Nocturnal awakenings- 1 time Out of bed/start of day- 6-7am Weight changes- +30 lbs/-30 lbs Do you operate heavy machinery- No Do you currently wear CPAP- Yes Do you current wear oxygen- No [facility]- 9   10/24/22- Dr.Sood  Seen recently by [facility] and previously seen at [facility].  Home sleep study from February 2022 showed moderate sleep apnea.  She was started on CPAP in January 2023.  Her PFT showed bronchodilator  response and mild restriction/diffusion defect.  CT chest from May 2023 was normal.  She has changed her diet and exercising regularly.  She is hoping to get down below 225 lbs.  She has been experiencing more fatigue since being started on blood pressure medication.  Has occasional sinus congestion.  Gets winded at times when walking.  Not having much cough or wheeze.    CPAP working well.  No issues with pressure or mask fit.   04/14/2023- Interim hx  Patient presents today for CPAP compliance. Home sleep study from February 2022 showed moderate sleep apnea.  She was started on CPAP in January 2023.   Discussed the use of AI scribe software for clinical note transcription with the patient, who gave verbal consent to proceed.  History of Present Illness   The patient, with a history of sleep apnea, presents with concerns about her CPAP machine and intermittent chest discomfort. She describes the CPAP machine as occasionally suffocating and choking her, suggesting a possible need for pressure adjustment. She expresses dissatisfaction with her current CPAP vendor due to billing issues and wishes to switch to a different provider.  The patient also reports occasional chest discomfort, described as a brief squeezing sensation. She denies any correlation with exercise, stating that it occurs randomly, often during periods of high stress and while showering. She reports a decrease in stamina, particularly when using the elliptical machine at the gym. She also mentions feeling a heaviness on her chest, which led her to spend most of the day in bed on one occasion. She  has consulted a cardiologist, who performed an EKG and found no abnormalities, suggesting the discomfort might be related to her sleep apnea. However, the patient's sleep apnea is reportedly well-controlled with the CPAP machine.  The patient has a history of asthma, as suggested by previous breathing tests. She reports that drinking hot  beverages, particularly a tea called Breathe Deep with honey, seems to alleviate her symptoms. She denies any significant consumption of fried foods, citrusy foods, acidic foods, chocolate, caffeine, or alcohol, which are known to exacerbate acid reflux, another potential cause of her symptoms.  Patient is on medication called Zepbound, which she finds life-changing. She has lost nearly 50 lbs. She expresses concern about the potential cardiac implications of this medication, but she has not experienced any adverse effects to date. She also expresses concern about the potential long-term implications of her mild intermittent asthma.      Airview download 03/14/2023 - 04/12/2023 Usage days 27 days (90%) greater than 4 hours Average usage 6 hours 27 minutes Pressure 5 to 15 cm H2O (12.2 cm H2O-95%) air leaks 9.2 L/min (95%) AHI 0.6   No Known Allergies  Immunization History  Administered Date(s) Administered   PFIZER(Purple Top)SARS-COV-2 Vaccination 09/02/2020, 10/07/2020   PPD Test 01/28/2016    Past Medical History:  Diagnosis Date   COVID-19    Elevated BP without diagnosis of hypertension 02/22/2020   Hypertension    Obesity (BMI 30-39.9) 03/19/2011   OSA (obstructive sleep apnea)     Tobacco History: Social History   Tobacco Use  Smoking Status Never  Smokeless Tobacco Never   Counseling given: Not Answered   Outpatient Medications Prior to Visit  Medication Sig Dispense Refill   ascorbic acid (VITAMIN C) 250 MG tablet Take 1 tablet by mouth daily.     Cholecalciferol 10 MCG (400 UNIT) CAPS Take by mouth.     ELDERBERRY PO Take by mouth.     labetalol (NORMODYNE) 100 MG tablet 100 mg 2 (two) times daily.     Multiple Vitamin (MULTIVITAMIN) capsule Take 1 capsule by mouth daily.     Omega 3-6-9 Fatty Acids (ADULT OMEGA PLUS DHA PO) Take by mouth.     OVER THE COUNTER MEDICATION CTS 360 DIETARY SUPPLEMENT .Marland Kitchen429 MG.. TAKES TWICE A DAY. TONALIN - DIETARY SUPPLEMENT 2000  MG TWICE ADAY     tirzepatide (ZEPBOUND) 10 MG/0.5ML Pen Inject 10 mg into the skin once a week. 2 mL 0   albuterol (VENTOLIN HFA) 108 (90 Base) MCG/ACT inhaler Inhale 2 puffs into the lungs every 6 (six) hours as needed for wheezing or shortness of breath. (Patient not taking: Reported on 04/14/2023) 8 g 6   No facility-administered medications prior to visit.      Review of Systems  Review of Systems  Constitutional: Negative.   HENT: Negative.    Respiratory:  Positive for chest tightness and shortness of breath.   Cardiovascular: Negative.      Physical Exam  BP 124/72 (BP Location: Left Arm, Cuff Size: Large)   Pulse 86   Temp 97.8 F (36.6 C) (Temporal)   Ht 5\' 4"  (1.626 m)   Wt 259 lb 6.4 oz (117.7 kg)   LMP 02/24/2023   SpO2 100%   BMI 44.53 kg/m  Physical Exam Constitutional:      General: She is not in acute distress.    Appearance: Normal appearance. She is not ill-appearing.  Cardiovascular:     Rate and Rhythm: Normal rate and regular rhythm.  Pulmonary:     Effort: Pulmonary effort is normal.     Breath sounds: Normal breath sounds. No wheezing or rales.  Skin:    General: Skin is warm and dry.  Neurological:     General: No focal deficit present.     Mental Status: She is alert and oriented to person, place, and time. Mental status is at baseline.  Psychiatric:        Mood and Affect: Mood normal.        Behavior: Behavior normal.        Thought Content: Thought content normal.        Judgment: Judgment normal.      Lab Results:  CBC    Component Value Date/Time   WBC 5.3 03/04/2022 1056   WBC 6.7 09/02/2021 1421   RBC 4.03 03/04/2022 1056   RBC 4.78 09/02/2021 1421   HGB 11.7 03/04/2022 1056   HCT 35.0 03/04/2022 1056   PLT 403.0 (H) 09/02/2021 1421   MCV 87 03/04/2022 1056   MCH 29.0 03/04/2022 1056   MCH 29.4 08/31/2021 1821   MCHC 33.4 03/04/2022 1056   MCHC 33.0 09/02/2021 1421   RDW 12.4 03/04/2022 1056   LYMPHSABS 2.2  03/04/2022 1056   MONOABS 0.5 09/02/2021 1421   EOSABS 0.0 03/04/2022 1056   BASOSABS 0.0 03/04/2022 1056    BMET    Component Value Date/Time   NA 138 03/04/2022 1056   K 4.6 03/04/2022 1056   CL 102 03/04/2022 1056   CO2 23 03/04/2022 1056   GLUCOSE 84 03/04/2022 1056   GLUCOSE 87 09/18/2021 0909   BUN 9 03/04/2022 1056   CREATININE 0.73 03/04/2022 1056   CREATININE 0.79 09/05/2015 1201   CALCIUM 9.5 03/04/2022 1056   GFRNONAA >60 08/31/2021 1821    BNP No results found for: "BNP"  ProBNP No results found for: "PROBNP"  Imaging: No results found.   Assessment & Plan:   1. OSA (obstructive sleep apnea) (Primary) - Ambulatory Referral for DME  2. Mild intermittent asthma without complication    Mild Intermittent Asthma Chest tightness and decreased exercise tolerance. Breathing tests suggestive of asthma. No significant allergies identified. Start AirSupra 90-34mcg (budesonide and albuterol) two puffs every 4-6 hours as needed, especially before exercise. Patient was given coupon card. Advised she rinse mouth after use to prevent fungal infection. Encourage she receive annual influenza and covid vaccines.   Sleep Apnea Patient reports discomfort with CPAP machine but acknowledges its benefits. Sleep apnea well controlled with current CPAP settings 5-15cm h20. No residual apneas on [employer]. No changes recommended. Continue current CPAP settings.  Possible Gastroesophageal Reflux Disease (GERD) Patient reports chest heaviness, which could be related to reflux. Advised patient follow a reflux diet.  Stress Patient reports high levels of stress due to work. She is not interested in medication. Consider stress management techniques.  General Health Maintenance Continue weight loss efforts for overall health improvement.  Follow-up -Continue monitoring symptoms and response to new asthma medication. -Consider further evaluation if symptoms persist or  worsen.      Glenford Bayley, NP 04/14/2023

## 2023-04-26 ENCOUNTER — Other Ambulatory Visit (HOSPITAL_COMMUNITY): Payer: Self-pay

## 2023-05-16 ENCOUNTER — Other Ambulatory Visit (HOSPITAL_COMMUNITY): Payer: Self-pay

## 2023-05-26 ENCOUNTER — Ambulatory Visit (INDEPENDENT_AMBULATORY_CARE_PROVIDER_SITE_OTHER): Payer: BC Managed Care – PPO | Admitting: Internal Medicine

## 2023-06-09 DIAGNOSIS — Z113 Encounter for screening for infections with a predominantly sexual mode of transmission: Secondary | ICD-10-CM | POA: Diagnosis not present

## 2023-06-09 DIAGNOSIS — Z01419 Encounter for gynecological examination (general) (routine) without abnormal findings: Secondary | ICD-10-CM | POA: Diagnosis not present

## 2023-06-16 ENCOUNTER — Ambulatory Visit (INDEPENDENT_AMBULATORY_CARE_PROVIDER_SITE_OTHER): Payer: BC Managed Care – PPO | Admitting: Internal Medicine

## 2023-06-23 ENCOUNTER — Ambulatory Visit (INDEPENDENT_AMBULATORY_CARE_PROVIDER_SITE_OTHER): Payer: BC Managed Care – PPO | Admitting: Internal Medicine

## 2023-06-23 ENCOUNTER — Encounter (INDEPENDENT_AMBULATORY_CARE_PROVIDER_SITE_OTHER): Payer: Self-pay | Admitting: Internal Medicine

## 2023-06-23 ENCOUNTER — Other Ambulatory Visit (HOSPITAL_COMMUNITY): Payer: Self-pay

## 2023-06-23 DIAGNOSIS — E66813 Obesity, class 3: Secondary | ICD-10-CM | POA: Diagnosis not present

## 2023-06-23 DIAGNOSIS — Z6841 Body Mass Index (BMI) 40.0 and over, adult: Secondary | ICD-10-CM

## 2023-06-23 DIAGNOSIS — G4733 Obstructive sleep apnea (adult) (pediatric): Secondary | ICD-10-CM

## 2023-06-23 DIAGNOSIS — R638 Other symptoms and signs concerning food and fluid intake: Secondary | ICD-10-CM | POA: Diagnosis not present

## 2023-06-23 DIAGNOSIS — I10 Essential (primary) hypertension: Secondary | ICD-10-CM | POA: Diagnosis not present

## 2023-06-23 MED ORDER — ZEPBOUND 10 MG/0.5ML ~~LOC~~ SOAJ
10.0000 mg | SUBCUTANEOUS | 1 refills | Status: DC
Start: 2023-06-23 — End: 2023-07-26
  Filled 2023-06-23 – 2023-07-08 (×2): qty 2, 28d supply, fill #0

## 2023-06-23 NOTE — Progress Notes (Signed)
Office: [phone]  /  Fax: [phone]  Weight Summary And Biometrics  Vitals Temp: 98.3 F (36.8 C) BP: 127/84 Pulse Rate: 86 SpO2: 100 %   Anthropometric Measurements Height: 5\' 4"  (1.626 m) Weight: 246 lb (111.6 kg) BMI (Calculated): 42.21 Weight at Last Visit: 254 lb Weight Lost Since Last Visit: 8 lb Weight Gained Since Last Visit: 0 Starting Weight: 274 lb Total Weight Loss (lbs): 28 lb (12.7 kg)   Body Composition  Body Fat %: 49.4 % Fat Mass (lbs): 122 lbs Muscle Mass (lbs): 118.4 lbs Total Body Water (lbs): 93.2 lbs Visceral Fat Rating : 14    No data recorded Today's Visit #: 17  Starting Date: 02/04/22   Subjective   Chief Complaint: Obesity  Discussed the use of AI scribe software for clinical note transcription with the patient, who gave verbal consent to proceed.  History of Present Illness   Cassie Duncan is a 38 year old female who presents for medical weight management.  She is managing obesity, hypertension, obstructive sleep apnea, and prediabetes through medical weight management. Since her last visit, she has lost eight pounds and adheres to a low carb, high protein diet plan 90-100% of the time. She tracks her calories, consumes more whole foods, and drinks water, although she occasionally skips meals. She has lost 51 pounds since starting her weight management journey, which is 18% of her total body weight.  She exercises seven days a week for 65 minutes, including strengthening and cardio exercises. On weekends, she extends her workouts to 90 minutes, incorporating the elliptical, treadmill, and stair stepper. She maintains consistency in her workouts, even on holidays, and feels that her mood has improved with regular exercise.  She is currently taking subcutaneous Zepbound 10 mg once a week since March 2024. She notes changes in her appetite and taste, possibly due to the medication. She is also aware of the impact of her diet on  her mood and anxiety levels.  She mentions gaining 11 pounds after Thanksgiving due to overeating but has since resumed her weight loss efforts. She is aware of the impact of her menstrual cycle on her weight fluctuations and is currently on her cycle.  She reports getting about six to seven hours of sleep per night and is not experiencing high levels of stress. She is working with a Chief Executive Officer who is helping her stay motivated and consistent with her exercise routine.         Assessment and Plan   Treatment Plan For Obesity:  Recommended Dietary Goals  Cassie Duncan is currently in the action stage of change. As such, her goal is to continue weight management plan. She has agreed to: continue current plan  Behavioral Health and Counseling  We discussed the following behavioral modification strategies today: continue to work on maintaining a reduced calorie state, getting the recommended amount of protein, incorporating whole foods, making healthy choices, staying well hydrated and practicing mindfulness when eating..  Additional education and resources provided today: None  Recommended Physical Activity Goals  Cassie Duncan has been advised to work up to 150 minutes of moderate intensity aerobic activity a week and strengthening exercises 2-3 times per week for cardiovascular health, weight loss maintenance and preservation of muscle mass.   She has agreed to :  Think about enjoyable ways to increase daily physical activity and overcoming barriers to exercise and Increase physical activity in their day and reduce sedentary time (increase NEAT).  Pharmacotherapy  We discussed various medication  options to help Cassie Duncan with her weight loss efforts and we both agreed to : adequate clinical response to current dose, continue current regimen and do not recommend further increases in GLP-1 due to loss of muscle  Associated Conditions Impacted by Obesity Treatment  Assessment & Plan OSA  (obstructive sleep apnea)  Class 3 severe obesity with serious comorbidity and body mass index (BMI) of 45.0 to 49.9 in adult, unspecified obesity type [facility])  Essential hypertension     Assessment and Plan    Obesity Presents for medical weight management. Since March 2024, lost 51 pounds (18% of total body weight) on semaglutide 10 mg weekly. Follows a low-carb, high-protein diet, exercises daily, and tracks calories. Reports occasional overeating and skipping meals. Discussed maintaining a balanced diet, adequate protein intake, and hydration to support muscle mass and overall health. Emphasized the role of gut microbiome in food cravings and mood. Explained that consistent weight loss and maintenance for 12 months can reset body fat mass set point. Risks of rapid weight loss include muscle loss and nutritional deficiencies. Benefits include improved metabolic rate and overall health. Alternatives include adjusting diet and exercise without medication. - Continue Zepbound 10 mg weekly.  Do not recommend increasing due to loss of muscle mass - Increase protein intake to 120 grams per day - Track protein intake using an app -Muscle loss currently at 30% and above acceptable range -Work on increasing calories and protein and possibly slow down weight loss rate - Ensure daily hydration with at least 90 ounces of water - Follow up in 4 weeks to monitor progress  Abnormal Food Appetite Reports occasional overeating and skipping meals. Discussed the impact of gut microbiome on food cravings and mood. Emphasized the importance of consistent eating habits and mindful eating to manage appetite and prevent overeating. - Maintain consistent eating habits -Continue Zepbound at current dose - Focus on mindful eating - Avoid skipping meals  Hypertension Well-managed as part of her weight management plan. - Continue current management plan  Obstructive Sleep Apnea On CPAP. Well-managed through  weight loss. - Continue current management plan  Prediabetes Well-managed through weight loss. - Continue current management plan  General Health Maintenance Discussed the importance of maintaining a balanced diet, adequate protein intake, and hydration. Emphasized the role of exercise and sleep in overall health. - Continue daily exercise routine - Ensure adequate sleep (6-7 hours per night) - Maintain hydration with at least 90 ounces of water daily  Follow-up - Follow up in 4 weeks - Order labs for blood work before the next appointment.          Objective   Physical Exam:  Blood pressure 127/84, pulse 86, temperature 98.3 F (36.8 C), height 5\' 4"  (1.626 m), weight 246 lb (111.6 kg), SpO2 100%. Body mass index is 42.23 kg/m.  General: She is overweight, cooperative, alert, well developed, and in no acute distress. PSYCH: Has normal mood, affect and thought process.   HEENT: EOMI, sclerae are anicteric. Lungs: Normal breathing effort, no conversational dyspnea. Extremities: No edema.  Neurologic: No gross sensory or motor deficits. No tremors or fasciculations noted.    Diagnostic Data Reviewed:  BMET    Component Value Date/Time   NA 138 03/04/2022 1056   K 4.6 03/04/2022 1056   CL 102 03/04/2022 1056   CO2 23 03/04/2022 1056   GLUCOSE 84 03/04/2022 1056   GLUCOSE 87 09/18/2021 0909   BUN 9 03/04/2022 1056   CREATININE 0.73 03/04/2022 1056  CREATININE 0.79 09/05/2015 1201   CALCIUM 9.5 03/04/2022 1056   GFRNONAA >60 08/31/2021 1821   Lab Results  Component Value Date   HGBA1C 5.4 07/08/2022   HGBA1C 5.8 (H) 02/23/2012   Lab Results  Component Value Date   INSULIN 5.3 02/04/2022   Lab Results  Component Value Date   TSH 1.44 09/02/2021   CBC    Component Value Date/Time   WBC 5.3 03/04/2022 1056   WBC 6.7 09/02/2021 1421   RBC 4.03 03/04/2022 1056   RBC 4.78 09/02/2021 1421   HGB 11.7 03/04/2022 1056   HCT 35.0 03/04/2022 1056   PLT  403.0 (H) 09/02/2021 1421   MCV 87 03/04/2022 1056   MCH 29.0 03/04/2022 1056   MCH 29.4 08/31/2021 1821   MCHC 33.4 03/04/2022 1056   MCHC 33.0 09/02/2021 1421   RDW 12.4 03/04/2022 1056   Iron Studies No results found for: "IRON", "TIBC", "FERRITIN", "IRONPCTSAT" Lipid Panel     Component Value Date/Time   CHOL 161 02/04/2022 0953   TRIG 33 02/04/2022 0953   HDL 47 02/04/2022 0953   CHOLHDL 4 09/02/2021 1421   VLDL 9.4 09/02/2021 1421   LDLCALC 106 (H) 02/04/2022 0953   Hepatic Function Panel     Component Value Date/Time   PROT 7.3 03/04/2022 1056   ALBUMIN 3.9 03/04/2022 1056   AST 16 03/04/2022 1056   ALT 10 03/04/2022 1056   ALKPHOS 65 03/04/2022 1056   BILITOT 0.3 03/04/2022 1056      Component Value Date/Time   TSH 1.44 09/02/2021 1421   Nutritional Lab Results  Component Value Date   VD25OH 47.3 03/04/2022    Follow-Up   Return in about 4 weeks (around 07/21/2023) for For Weight Mangement with [name].Marland Kitchen She was informed of the importance of frequent follow up visits to maximize her success with intensive lifestyle modifications for her multiple health conditions.  Attestation Statement   Reviewed by clinician on day of visit: allergies, medications, problem list, medical history, surgical history, family history, social history, and previous encounter notes.     Worthy Rancher, MD

## 2023-06-23 NOTE — Progress Notes (Deleted)
Office: [phone]  /  Fax: [phone]  Weight Summary And Biometrics  Vitals Temp: 98.3 F (36.8 C) BP: 127/84 Pulse Rate: 86 SpO2: 100 %   Anthropometric Measurements Height: 5\' 4"  (1.626 m) Weight: 246 lb (111.6 kg) BMI (Calculated): 42.21 Weight at Last Visit: 254 lb Weight Lost Since Last Visit: 8 lb Weight Gained Since Last Visit: 0 Starting Weight: 274 lb Total Weight Loss (lbs): 28 lb (12.7 kg)   Body Composition  Body Fat %: 49.4 % Fat Mass (lbs): 122 lbs Muscle Mass (lbs): 118.4 lbs Total Body Water (lbs): 93.2 lbs Visceral Fat Rating : 14    No data recorded Today's Visit #: 17  Starting Date: 02/04/22   Subjective   Chief Complaint: Obesity  Discussed the use of AI scribe software for clinical note transcription with the patient, who gave verbal consent to proceed.  History of Present Illness             Assessment and Plan   Treatment Plan For Obesity:  Recommended Dietary Goals  Cassie Duncan is currently in the action stage of change. As such, her goal is to continue weight management plan. She has agreed to: {EMWTLOSSPLAN:29297::"continue current plan"}  Behavioral Health and Counseling  We discussed the following behavioral modification strategies today: {EMWMwtlossstrategies:28914::"continue to work on maintaining a reduced calorie state, getting the recommended amount of protein, incorporating whole foods, making healthy choices, staying well hydrated and practicing mindfulness when eating."}.  Additional education and resources provided today: {EMadditionalresources:29169::"None"}  Recommended Physical Activity Goals  Cassie Duncan has been advised to work up to 150 minutes of moderate intensity aerobic activity a week and strengthening exercises 2-3 times per week for cardiovascular health, weight loss maintenance and preservation of muscle mass.   She has agreed to :  {EMEXERCISE:28847::"Think about enjoyable ways to increase  daily physical activity and overcoming barriers to exercise","Increase physical activity in their day and reduce sedentary time (increase NEAT)."}  Pharmacotherapy  We discussed various medication options to help Cassie Duncan with her weight loss efforts and we both agreed to : {EMagreedrx:29170}  Associated Conditions Impacted by Obesity Treatment  Assessment & Plan OSA (obstructive sleep apnea)  Class 3 severe obesity with serious comorbidity and body mass index (BMI) of 45.0 to 49.9 in adult, unspecified obesity type [facility])  Essential hypertension     Assessment and Plan               Objective   Physical Exam:  Blood pressure 127/84, pulse 86, temperature 98.3 F (36.8 C), height 5\' 4"  (1.626 m), weight 246 lb (111.6 kg), SpO2 100%. Body mass index is 42.23 kg/m.  General: She is overweight, cooperative, alert, well developed, and in no acute distress. PSYCH: Has normal mood, affect and thought process.   HEENT: EOMI, sclerae are anicteric. Lungs: Normal breathing effort, no conversational dyspnea. Extremities: No edema.  Neurologic: No gross sensory or motor deficits. No tremors or fasciculations noted.    Diagnostic Data Reviewed:  BMET    Component Value Date/Time   NA 138 03/04/2022 1056   K 4.6 03/04/2022 1056   CL 102 03/04/2022 1056   CO2 23 03/04/2022 1056   GLUCOSE 84 03/04/2022 1056   GLUCOSE 87 09/18/2021 0909   BUN 9 03/04/2022 1056   CREATININE 0.73 03/04/2022 1056   CREATININE 0.79 09/05/2015 1201   CALCIUM 9.5 03/04/2022 1056   GFRNONAA >60 08/31/2021 1821   Lab Results  Component Value Date   HGBA1C 5.4 07/08/2022   HGBA1C  5.8 (H) 02/23/2012   Lab Results  Component Value Date   INSULIN 5.3 02/04/2022   Lab Results  Component Value Date   TSH 1.44 09/02/2021   CBC    Component Value Date/Time   WBC 5.3 03/04/2022 1056   WBC 6.7 09/02/2021 1421   RBC 4.03 03/04/2022 1056   RBC 4.78 09/02/2021 1421   HGB 11.7 03/04/2022 1056    HCT 35.0 03/04/2022 1056   PLT 403.0 (H) 09/02/2021 1421   MCV 87 03/04/2022 1056   MCH 29.0 03/04/2022 1056   MCH 29.4 08/31/2021 1821   MCHC 33.4 03/04/2022 1056   MCHC 33.0 09/02/2021 1421   RDW 12.4 03/04/2022 1056   Iron Studies No results found for: "IRON", "TIBC", "FERRITIN", "IRONPCTSAT" Lipid Panel     Component Value Date/Time   CHOL 161 02/04/2022 0953   TRIG 33 02/04/2022 0953   HDL 47 02/04/2022 0953   CHOLHDL 4 09/02/2021 1421   VLDL 9.4 09/02/2021 1421   LDLCALC 106 (H) 02/04/2022 0953   Hepatic Function Panel     Component Value Date/Time   PROT 7.3 03/04/2022 1056   ALBUMIN 3.9 03/04/2022 1056   AST 16 03/04/2022 1056   ALT 10 03/04/2022 1056   ALKPHOS 65 03/04/2022 1056   BILITOT 0.3 03/04/2022 1056      Component Value Date/Time   TSH 1.44 09/02/2021 1421   Nutritional Lab Results  Component Value Date   VD25OH 47.3 03/04/2022    Follow-Up   No follow-ups on file.Marland Kitchen She was informed of the importance of frequent follow up visits to maximize her success with intensive lifestyle modifications for her multiple health conditions.  Attestation Statement   Reviewed by clinician on day of visit: allergies, medications, problem list, medical history, surgical history, family history, social history, and previous encounter notes.     Worthy Rancher, MD

## 2023-06-28 ENCOUNTER — Other Ambulatory Visit (HOSPITAL_COMMUNITY): Payer: Self-pay

## 2023-07-05 ENCOUNTER — Other Ambulatory Visit (HOSPITAL_COMMUNITY): Payer: Self-pay

## 2023-07-08 ENCOUNTER — Telehealth: Payer: Self-pay | Admitting: Primary Care

## 2023-07-08 ENCOUNTER — Other Ambulatory Visit (HOSPITAL_COMMUNITY): Payer: Self-pay

## 2023-07-08 ENCOUNTER — Telehealth: Payer: Self-pay | Admitting: *Deleted

## 2023-07-08 DIAGNOSIS — G4733 Obstructive sleep apnea (adult) (pediatric): Secondary | ICD-10-CM

## 2023-07-08 NOTE — Telephone Encounter (Signed)
 I spoke with patient, a new order has been placed and a message has been sent to management to have them to call patient to discuss issues.

## 2023-07-08 NOTE — Telephone Encounter (Signed)
 Patient came into the office to speak with a manager in regards to getting her CPAP, states she was told it was done but it wasn't. I contacted [facility] and they do not have an order. Please advise. Patient needs this done ASAP. Routing to triage to place order and PCC's to followup after

## 2023-07-08 NOTE — Telephone Encounter (Signed)
 I spoke to the patient who is very unhappy with how things have been handled. She states that she has been trying to get her supplies and feels like our office has been negligent in our duties. I explained to the patient that an order had been sent in December and [facility] noted they received it in December. I also explained that one of our nurses reached out to her in February via mychart response and it did not look like she had read it. I called [facility] and spoke with [facility]. She stated that she did not have an order for anything. I told her I could see on the order back in December where she did in fact make note that the order was printed. She is unsure what happened to order but said she would get it started on again today. I asked her to reach out to patient to let her know and I will do the same. A new order was placed since the old order was "too old" and would need to be sent back to provider.

## 2023-07-08 NOTE — Telephone Encounter (Signed)
 Sent a stat message to [facility] we did receive confirmation back that they got the order bck in 04/2023 waiting on a response

## 2023-07-08 NOTE — Telephone Encounter (Signed)
 Called patient as requested and she was about to enter a meeting. She asked for my contact information which I gave as well as my email address. She advised she will contact me Monday 07/11/23 between 830-10 am.

## 2023-07-08 NOTE — Telephone Encounter (Signed)
 Messaged [facility] for status. Patient made aware.

## 2023-07-08 NOTE — Telephone Encounter (Signed)
 Please reach out to patient as she has concerns about the office.

## 2023-07-19 ENCOUNTER — Encounter: Payer: Self-pay | Admitting: Primary Care

## 2023-07-22 DIAGNOSIS — G4733 Obstructive sleep apnea (adult) (pediatric): Secondary | ICD-10-CM | POA: Diagnosis not present

## 2023-07-26 ENCOUNTER — Other Ambulatory Visit (HOSPITAL_COMMUNITY): Payer: Self-pay

## 2023-07-26 ENCOUNTER — Encounter (INDEPENDENT_AMBULATORY_CARE_PROVIDER_SITE_OTHER): Payer: Self-pay | Admitting: Internal Medicine

## 2023-07-26 ENCOUNTER — Ambulatory Visit (INDEPENDENT_AMBULATORY_CARE_PROVIDER_SITE_OTHER): Payer: BC Managed Care – PPO | Admitting: Internal Medicine

## 2023-07-26 VITALS — BP 102/69 | HR 72 | Temp 98.7°F | Ht 64.0 in | Wt 242.0 lb

## 2023-07-26 DIAGNOSIS — E66813 Obesity, class 3: Secondary | ICD-10-CM

## 2023-07-26 DIAGNOSIS — I10 Essential (primary) hypertension: Secondary | ICD-10-CM | POA: Diagnosis not present

## 2023-07-26 DIAGNOSIS — Z6841 Body Mass Index (BMI) 40.0 and over, adult: Secondary | ICD-10-CM

## 2023-07-26 DIAGNOSIS — R5383 Other fatigue: Secondary | ICD-10-CM

## 2023-07-26 DIAGNOSIS — G4733 Obstructive sleep apnea (adult) (pediatric): Secondary | ICD-10-CM | POA: Diagnosis not present

## 2023-07-26 MED ORDER — ZEPBOUND 10 MG/0.5ML ~~LOC~~ SOAJ
10.0000 mg | SUBCUTANEOUS | 1 refills | Status: DC
  Filled 2023-07-26: qty 2, 28d supply, fill #0

## 2023-07-26 NOTE — Progress Notes (Unsigned)
 Office: [phone]  /  Fax: [phone]  Weight Summary And Biometrics  Vitals Temp: 98.7 F (37.1 C) BP: 102/69 Pulse Rate: 72 SpO2: 98 %   Anthropometric Measurements Height: 5\' 4"  (1.626 m) Weight: 242 lb (109.8 kg) BMI (Calculated): 41.52 Weight at Last Visit: 246 lb Weight Lost Since Last Visit: 4 lb Weight Gained Since Last Visit: 0 Starting Weight: 274 lb Total Weight Loss (lbs): 32 lb (14.5 kg) Peak Weight: 312 lb   Body Composition  Body Fat %: 48.5 % Fat Mass (lbs): 117.4 lbs Muscle Mass (lbs): 118.4 lbs Total Body Water (lbs): 93 lbs Visceral Fat Rating : 13    No data recorded Today's Visit #: 18  Starting Date: 02/04/22   Subjective   Chief Complaint: Obesity  Interval History Discussed the use of AI scribe software for clinical note transcription with the patient, who gave verbal consent to proceed.  History of Present Illness Cassie Duncan is a 38 year old female who presents for medical weight management.  She has lost four pounds since her last visit and is actively engaged in a medical weight management program. She follows a low-carb pescetarian diet approximately 70% of the time, focusing on whole foods and tracking her calories and macros. Despite her efforts, she struggles to consume the right amount of protein. She is not skipping meals and maintains a diet log with her trainer.  She exercises seven days a week for 60 to 90 minutes, incorporating elliptical workouts and weight lifting. Despite her rigorous exercise routine, she feels very tired, attributing this to lifestyle factors and business stress. She averages six hours of sleep per night and has resolved previous issues with her CPAP machine. She wants to lose weight to discontinue her blood pressure medication and CPAP use.  She is currently taking Labetalol for hypertension, initially prescribed due to her being in childbearing years and her desire to be a parent. Her blood  pressure is well-controlled, and she wants to reduce or discontinue this medication as part of her weight loss goals.  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 She has had to let several employees go and is currently in the process of hiring new staff. She describes changes in her personal relationships and is focusing more on herself, which has been both challenging and rewarding.  She reports fatigue and a more frequent menstrual cycle. No skipping meals and is not using an app for diet tracking. She is not getting enough sleep, averaging six hours per night, and has resolved issues with her CPAP machine.    Challenges affecting patient progress: work schedule, inadequate sleep, moderate to high levels of stress, and symptoms of fatigue .    Pharmacotherapy for weight management: She is currently taking Zepbound with adequate clinical response  and without side effects..   Assessment and Plan   Treatment Plan For Obesity:  Recommended Dietary Goals  Cassie Duncan is currently in the action stage of change. As such, her goal is to continue weight management plan. She has agreed to: keep a food journal with a target of  1400 calories per day and 90-120 grams of protein per day or 30-40 grams per meal.  Behavioral Health and Counseling  We discussed the following behavioral modification strategies today: increasing lean protein intake to established goals, avoiding skipping meals, and increasing water intake .  Additional education and resources provided today: None  Recommended Physical Activity Goals  Cassie Duncan has been  advised to work up to 150 minutes of moderate intensity aerobic activity a week and strengthening exercises 2-3 times per week for cardiovascular health, weight loss maintenance and preservation of muscle mass.   She has agreed to :  Continue current level of physical activity   Pharmacotherapy  We discussed  various medication options to help Islam with her weight loss efforts and we both agreed to : adequate clinical response to current dose, continue current regimen  Associated Conditions Impacted by Obesity Treatment  Other fatigue  OSA (obstructive sleep apnea) -     Zepbound; Inject 10 mg into the skin once a week.  Dispense: 2 mL; Refill: 1  Class 3 severe obesity with serious comorbidity and body mass index (BMI) of 45.0 to 49.9 in adult, unspecified obesity type (HCC) -     Zepbound; Inject 10 mg into the skin once a week.  Dispense: 2 mL; Refill: 1  Essential hypertension -     Zepbound; Inject 10 mg into the skin once a week.  Dispense: 2 mL; Refill: 1    Assessment and Plan Assessment & Plan Obesity She is actively engaged in a medical weight management program, having lost four pounds since the last visit. She follows a low-carb pescetarian diet about 70% of the time, tracks calories and macros, and exercises seven days a week for 60 to 90 minutes, including elliptical and weight lifting. Her body fat percentage has decreased, and muscle mass has been maintained. Aiming for a weight loss of one pound per week is considered optimal to avoid muscle loss. Increasing medication dosage is not recommended as it may worsen protein and calorie intake issues. Her total body weight loss is 12% from her starting weight and 19% from her peak weight, indicating significant progress. - Continue current diet and exercise regimen. - Increase protein intake. - Consider using MyNet Diary app for meal tracking. - Maintain a calorie intake of approximately 1400 calories per day. - Ensure adequate hydration and multivitamin intake.  OSA She is now back on CPAP she was having some problems with device.  Continue with medical weight loss inclusive of GLP-1 therapy  Hypertension Currently on Labetalol for hypertension, with a blood pressure reading of 102/69. Labetalol may be contributing to her  fatigue. She desires to discontinue the medication, and her blood pressure is well-controlled, allowing for a potential tapering off. Reducing the dose may help alleviate fatigue and other side effects associated with beta blockers. - Reduce Labetalol to half the current dose twice a day. - Monitor blood pressure at home. - If blood pressure remains stable, discontinue Labetalol after one week.  Fatigue Reports significant fatigue, potentially related to insufficient sleep, work stress, and Labetalol side effects. Fatigue may be due to sleep debt or burnout. Blood work is recommended to rule out other causes. Adjusting work schedule to allow for more sleep is advised. - Obtain blood work to assess for underlying causes of fatigue. - Ensure adequate rest and consider modifying work schedule to allow for more sleep. - Monitor fatigue levels after adjusting Labetalol dosage.      Objective   Physical Exam:  Blood pressure 102/69, pulse 72, temperature 98.7 F (37.1 C), height 5\' 4"  (1.626 m), weight 242 lb (109.8 kg), SpO2 98%. Body mass index is 41.54 kg/m.  General: She is overweight, cooperative, alert, well developed, and in no acute distress. PSYCH: Has normal mood, affect and thought process.   HEENT: EOMI, sclerae are anicteric. Lungs: Normal breathing  effort, no conversational dyspnea. Extremities: No edema.  Neurologic: No gross sensory or motor deficits. No tremors or fasciculations noted.    Diagnostic Data Reviewed:  BMET    Component Value Date/Time   NA 138 03/04/2022 1056   K 4.6 03/04/2022 1056   CL 102 03/04/2022 1056   CO2 23 03/04/2022 1056   GLUCOSE 84 03/04/2022 1056   GLUCOSE 87 09/18/2021 0909   BUN 9 03/04/2022 1056   CREATININE 0.73 03/04/2022 1056   CREATININE 0.79 09/05/2015 1201   CALCIUM 9.5 03/04/2022 1056   GFRNONAA >60 08/31/2021 1821   Lab Results  Component Value Date   HGBA1C 5.4 07/08/2022   HGBA1C 5.8 (H) 02/23/2012   Lab Results   Component Value Date   INSULIN 5.3 02/04/2022   Lab Results  Component Value Date   TSH 1.44 09/02/2021   CBC    Component Value Date/Time   WBC 5.3 03/04/2022 1056   WBC 6.7 09/02/2021 1421   RBC 4.03 03/04/2022 1056   RBC 4.78 09/02/2021 1421   HGB 11.7 03/04/2022 1056   HCT 35.0 03/04/2022 1056   PLT 403.0 (H) 09/02/2021 1421   MCV 87 03/04/2022 1056   MCH 29.0 03/04/2022 1056   MCH 29.4 08/31/2021 1821   MCHC 33.4 03/04/2022 1056   MCHC 33.0 09/02/2021 1421   RDW 12.4 03/04/2022 1056   Iron Studies No results found for: "IRON", "TIBC", "FERRITIN", "IRONPCTSAT" Lipid Panel     Component Value Date/Time   CHOL 161 02/04/2022 0953   TRIG 33 02/04/2022 0953   HDL 47 02/04/2022 0953   CHOLHDL 4 09/02/2021 1421   VLDL 9.4 09/02/2021 1421   LDLCALC 106 (H) 02/04/2022 0953   Hepatic Function Panel     Component Value Date/Time   PROT 7.3 03/04/2022 1056   ALBUMIN 3.9 03/04/2022 1056   AST 16 03/04/2022 1056   ALT 10 03/04/2022 1056   ALKPHOS 65 03/04/2022 1056   BILITOT 0.3 03/04/2022 1056      Component Value Date/Time   TSH 1.44 09/02/2021 1421   Nutritional Lab Results  Component Value Date   VD25OH 47.3 03/04/2022    Medications: Outpatient Encounter Medications as of 07/26/2023  Medication Sig   albuterol (VENTOLIN HFA) 108 (90 Base) MCG/ACT inhaler Inhale 2 puffs into the lungs every 6 (six) hours as needed for wheezing or shortness of breath.   Albuterol-Budesonide (AIRSUPRA) 90-80 MCG/ACT AERO Inhale 2 puffs into the lungs every 4 (four) hours as needed (Shortness of breath or chest tightness).   ascorbic acid (VITAMIN C) 250 MG tablet Take 1 tablet by mouth daily.   Cholecalciferol 10 MCG (400 UNIT) CAPS Take by mouth.   ELDERBERRY PO Take by mouth.   labetalol (NORMODYNE) 100 MG tablet 100 mg 2 (two) times daily.   Multiple Vitamin (MULTIVITAMIN) capsule Take 1 capsule by mouth daily.   Omega 3-6-9 Fatty Acids (ADULT OMEGA PLUS DHA PO) Take by  mouth.   OVER THE COUNTER MEDICATION CTS 360 DIETARY SUPPLEMENT .Marland Kitchen429 MG.. TAKES TWICE A DAY. TONALIN - DIETARY SUPPLEMENT 2000 MG TWICE ADAY   [DISCONTINUED] tirzepatide (ZEPBOUND) 10 MG/0.5ML Pen Inject 10 mg into the skin once a week.   tirzepatide (ZEPBOUND) 10 MG/0.5ML Pen Inject 10 mg into the skin once a week.   No facility-administered encounter medications on file as of 07/26/2023.     Follow-Up   Return in about 4 weeks (around 08/23/2023) for For Weight Mangement with [name].Marland Kitchen She was informed of  the importance of frequent follow up visits to maximize her success with intensive lifestyle modifications for her multiple health conditions.  Attestation Statement   Reviewed by clinician on day of visit: allergies, medications, problem list, medical history, surgical history, family history, social history, and previous encounter notes.     Worthy Rancher, MD

## 2023-08-03 ENCOUNTER — Telehealth: Payer: Self-pay | Admitting: Primary Care

## 2023-08-03 NOTE — Telephone Encounter (Signed)
 Rc'd fax from [facility] for CPAP supplies. Will put in Ms. Walsh's box for signature.

## 2023-08-04 ENCOUNTER — Encounter: Admitting: Family

## 2023-08-05 NOTE — Telephone Encounter (Signed)
 Fax'd to [facility], rec'd confirmation. Will put in scan.

## 2023-08-16 ENCOUNTER — Ambulatory Visit (INDEPENDENT_AMBULATORY_CARE_PROVIDER_SITE_OTHER): Admitting: Family

## 2023-08-16 ENCOUNTER — Encounter: Payer: Self-pay | Admitting: Family

## 2023-08-16 VITALS — BP 125/82 | HR 76 | Temp 97.5°F | Ht 64.0 in | Wt 240.4 lb

## 2023-08-16 DIAGNOSIS — I10 Essential (primary) hypertension: Secondary | ICD-10-CM | POA: Diagnosis not present

## 2023-08-16 DIAGNOSIS — Z0000 Encounter for general adult medical examination without abnormal findings: Secondary | ICD-10-CM

## 2023-08-16 DIAGNOSIS — E7849 Other hyperlipidemia: Secondary | ICD-10-CM

## 2023-08-16 DIAGNOSIS — E66813 Obesity, class 3: Secondary | ICD-10-CM | POA: Diagnosis not present

## 2023-08-16 DIAGNOSIS — Z6841 Body Mass Index (BMI) 40.0 and over, adult: Secondary | ICD-10-CM

## 2023-08-16 LAB — COMPREHENSIVE METABOLIC PANEL WITH GFR
ALT: 9 U/L (ref 0–35)
AST: 14 U/L (ref 0–37)
Albumin: 4 g/dL (ref 3.5–5.2)
Alkaline Phosphatase: 42 U/L (ref 39–117)
BUN: 16 mg/dL (ref 6–23)
CO2: 24 meq/L (ref 19–32)
Calcium: 9.4 mg/dL (ref 8.4–10.5)
Chloride: 105 meq/L (ref 96–112)
Creatinine, Ser: 0.62 mg/dL (ref 0.40–1.20)
GFR: 112.86 mL/min (ref >60.00)
Glucose, Bld: 73 mg/dL (ref 70–99)
Potassium: 4 meq/L (ref 3.5–5.1)
Sodium: 135 meq/L (ref 135–145)
Total Bilirubin: 0.4 mg/dL (ref 0.2–1.2)
Total Protein: 7.4 g/dL (ref 6.0–8.3)

## 2023-08-16 LAB — CBC WITH DIFFERENTIAL/PLATELET
Basophils Absolute: 0 10*3/uL (ref 0.0–0.1)
Basophils Relative: 0.4 % (ref 0.0–3.0)
Eosinophils Absolute: 0 10*3/uL (ref 0.0–0.7)
Eosinophils Relative: 0.6 % (ref 0.0–5.0)
HCT: 39.2 % (ref 36.0–46.0)
Hemoglobin: 12.8 g/dL (ref 12.0–15.0)
Lymphocytes Relative: 46.4 % — ABNORMAL HIGH (ref 12.0–46.0)
Lymphs Abs: 2.3 10*3/uL (ref 0.7–4.0)
MCHC: 32.6 g/dL (ref 30.0–36.0)
MCV: 89.5 fl (ref 78.0–100.0)
Monocytes Absolute: 0.3 10*3/uL (ref 0.1–1.0)
Monocytes Relative: 5.6 % (ref 3.0–12.0)
Neutro Abs: 2.3 10*3/uL (ref 1.4–7.7)
Neutrophils Relative %: 47 % (ref 43.0–77.0)
Platelets: 308 10*3/uL (ref 150.0–400.0)
RBC: 4.37 Mil/uL (ref 3.87–5.11)
RDW: 13.3 % (ref 11.5–15.5)
WBC: 4.9 10*3/uL (ref 4.0–10.5)

## 2023-08-16 LAB — LIPID PANEL
Cholesterol: 144 mg/dL (ref 0–200)
HDL: 40.2 mg/dL (ref >39.00)
LDL Cholesterol: 96 mg/dL (ref 0–99)
NonHDL: 103.71
Total CHOL/HDL Ratio: 4
Triglycerides: 39 mg/dL (ref 0.0–149.0)
VLDL: 7.8 mg/dL (ref 0.0–40.0)

## 2023-08-16 LAB — TSH: TSH: 1.47 u[IU]/mL (ref 0.35–5.50)

## 2023-08-16 NOTE — Progress Notes (Signed)
 Phone [phone]  Subjective:   Patient is a 38 y.o. female presenting for annual physical.    Chief Complaint  Patient presents with   Annual Exam    Fasting w/ labs    History of Present Illness The patient, with a history of hypertension and obesity, presents for a routine physical. She has been participating in a weight loss program, [facility],' and has lost 36 pounds since June 2023. She reports feeling well and has no new complaints. The patient's hypertension was previously difficult to control, with several medication trials causing intolerable side effects. She is currently managed on labetalol via Cardiology, which she tolerates well. She has been experimenting with reducing her dose and has been off the medication for the past two weeks, monitoring her blood pressure at home. The patient also has a history of fibroids, which have been stable and small in size. She reports a recent change in her menstrual cycle, with periods becoming shorter, now lasting about five days. She denies heavy bleeding or significant cramping. The patient is also on Zepbound for obesity, which she tolerates well. She reports using inhalers as needed in the past, primarily when she was heavier, but she does not currently need them. She takes vitamins and minerals, including omega-3, and occasionally takes over-the-counter vitamin D. The patient exercises seven days a week, including cardio and weight training. She denies smoking or vaping and reports no alcohol consumption. She has no concerns about sexually transmitted infections and is not on hormonal birth control. She had a Pap smear in February of the current year, which was normal.   See problem oriented charting- ROS- full  review of systems was completed and negative except for what is noted in HPI above.  The following were reviewed and entered/updated in epic: Past Medical History:  Diagnosis Date   Class 3 severe  obesity in adult (HCC) 02/22/2020   COVID-19    Elevated BP without diagnosis of hypertension 02/22/2020   Hypertension    Hypertension, essential 02/04/2022   Obesity (BMI 30-39.9) 03/19/2011   Obesity, morbid, BMI 50 or higher (HCC) 03/19/2011   OSA (obstructive sleep apnea)    Therapeutic drug monitoring 02/04/2022   Weight gain 07/26/2019   Patient Active Problem List   Diagnosis Date Noted   Chronic idiopathic constipation 03/17/2023   BMI 40.0-44.9, adult (HCC) 02/17/2023   Other Specified Feeding or Eating Disorder, Emotional Eating Behaviors 07/30/2022   Prediabetes 03/04/2022   Abnormal food appetite 03/04/2022   Other hyperlipidemia 02/04/2022   Class 3 severe obesity due to excess calories without serious comorbidity with body mass index (BMI) of 40.0 to 44.9 in adult (HCC) 02/04/2022   Tachycardia with hypertension 09/03/2021   Essential hypertension 08/27/2021   OSA (obstructive sleep apnea) 05/15/2021   Fatigue 05/26/2020   Palpitations 02/22/2020   Snoring 02/22/2020   Intramural leiomyoma of uterus 11/19/2015   DUB (dysfunctional uterine bleeding) 09/05/2015   Past Surgical History:  Procedure Laterality Date   WISDOM TOOTH EXTRACTION  2005    Family History  Problem Relation Age of Onset   Lupus Mother    Obesity Mother    Hypertension Father    Obesity Father    Hypertension Maternal Uncle    Diabetes Maternal Uncle    Diabetes Maternal Grandmother    Hypertension Maternal Grandmother    COPD Maternal Grandmother    Obesity Maternal Grandmother    Stroke Maternal Grandmother    Sleep apnea Maternal Grandmother  Medications- reviewed and updated Current Outpatient Medications  Medication Sig Dispense Refill   albuterol (VENTOLIN HFA) 108 (90 Base) MCG/ACT inhaler Inhale 2 puffs into the lungs every 6 (six) hours as needed for wheezing or shortness of breath. 8 g 6   Albuterol-Budesonide (AIRSUPRA) 90-80 MCG/ACT AERO Inhale 2 puffs into the  lungs every 4 (four) hours as needed (Shortness of breath or chest tightness). 10.7 g 1   ascorbic acid (VITAMIN C) 250 MG tablet Take 1 tablet by mouth daily.     Cholecalciferol 10 MCG (400 UNIT) CAPS Take by mouth.     ELDERBERRY PO Take by mouth.     labetalol (NORMODYNE) 100 MG tablet 100 mg 2 (two) times daily.     Multiple Vitamin (MULTIVITAMIN) capsule Take 1 capsule by mouth daily.     Omega 3-6-9 Fatty Acids (ADULT OMEGA PLUS DHA PO) Take by mouth.     OVER THE COUNTER MEDICATION CTS 360 DIETARY SUPPLEMENT .Aaron Aas429 MG.. TAKES TWICE A DAY. TONALIN - DIETARY SUPPLEMENT 2000 MG TWICE ADAY     tirzepatide (ZEPBOUND) 10 MG/0.5ML Pen Inject 10 mg into the skin once a week. 2 mL 1   No current facility-administered medications for this visit.   Allergies-reviewed and updated No Known Allergies  Social History   Social History Narrative   Not on file    Objective:  BP 125/82 (BP Location: Left Arm, Patient Position: Sitting, Cuff Size: Large)   Pulse 76   Temp (!) 97.5 F (36.4 C) (Temporal)   Ht 5\' 4"  (1.626 m)   Wt 240 lb 6 oz (109 kg)   LMP 08/09/2023 (Exact Date)   SpO2 100%   BMI 41.26 kg/m  Physical Exam Vitals and nursing note reviewed.  Constitutional:      Appearance: Normal appearance.  HENT:     Head: Normocephalic.     Right Ear: Tympanic membrane normal.     Left Ear: Tympanic membrane normal.     Nose: Nose normal.     Mouth/Throat:     Mouth: Mucous membranes are moist.  Eyes:     Pupils: Pupils are equal, round, and reactive to light.  Cardiovascular:     Rate and Rhythm: Normal rate and regular rhythm.  Pulmonary:     Effort: Pulmonary effort is normal.     Breath sounds: Normal breath sounds.  Musculoskeletal:        General: Normal range of motion.     Cervical back: Normal range of motion.  Lymphadenopathy:     Cervical: No cervical adenopathy.  Skin:    General: Skin is warm and dry.  Neurological:     Mental Status: She is alert.   Psychiatric:        Mood and Affect: Mood normal.        Behavior: Behavior normal.     Assessment and Plan   Health Maintenance counseling: 1. Anticipatory guidance: Patient counseled regarding regular dental exams q6 months, eye exams,  avoiding smoking and second hand smoke, limiting alcohol to 1 beverage per day, no illicit drugs.   2. Risk factor reduction:  Advised patient of need for regular exercise and diet rich with fruits and vegetables to reduce risk of heart attack and stroke. Reports doing days per week. Wt Readings from Last 3 Encounters:  08/16/23 240 lb 6 oz (109 kg)  07/26/23 242 lb (109.8 kg)  06/23/23 246 lb (111.6 kg)   3. Immunizations/screenings/ancillary studies Immunization History  Administered Date(s) Administered  HPV 9-valent 08/10/2019, 10/15/2019   PFIZER(Purple Top)SARS-COV-2 Vaccination 09/02/2020, 10/07/2020   PPD Test 01/28/2016   There are no preventive care reminders to display for this patient.  4. Cervical cancer screening: 06/2023 5. Skin cancer screening- advised regular sunscreen use. Denies worrisome, changing, or new skin lesions.  6. Birth control/STD check: none/N/A 7. Smoking associated screening: never- smoker 8. Alcohol screening:  none Assessment & Plan Hypertension Hypertension managed with labetalol, but causing lethargy. Pt stopped labetalol recently, BP at home and today are normal. Cardiologist appointment in June for medication reassessment. - Continue to monitor blood pressure at home. - Discuss medication plan with cardiologist in June. - Encourage low sodium diet, continued exercise and weight loss.  Obesity Weight reduced from 276 lbs to 240 lbs since June 2023. On Zepbound for weight loss through [facility], tolerating well. Regular exercise expected to improve health, including cholesterol and blood pressure. - Continue Zepbound for weight management. - Encourage continued exercise regimen. -  Monitor weight and adjust plan as needed.  Fibroids Small fibroids with slow growth, no intervention planned due to desire for future fertility.  - Monitor menstrual cycle changes. - Discuss any significant changes with gynecologist.  General Health Maintenance Maintains healthy lifestyle with regular exercise, no smoking or alcohol use. Normal Pap smear in February 2025. Elevated LDL cholesterol noted in 2023, weight loss may have improved levels. - Order full metabolic panel including metabolic panel, blood count, cholesterol panel, and thyroid function. - Encourage low saturated fat diet, continue daily exercise. - Advise on allergy management with over-the-counter options like generic Nasacort nasal spray or Claritin if symptoms arise.   Recommended follow up:  Return for any future concerns, Complete physical w/fasting labs. Future Appointments  Date Time Provider Department Center  08/30/2023  9:20 AM Ladd Picker, MD [facility] None  09/29/2023  9:20 AM Ladd Picker, MD [facility] None  11/11/2023 10:30 AM Antonio Baumgarten, NP [facility] None    Lab/Order associations: fasting    Versa Gore, NP

## 2023-08-16 NOTE — Assessment & Plan Note (Signed)
 Hypertension managed with labetalol, but causing lethargy. Pt stopped labetalol recently, BP at home and today are normal. Cardiologist appointment in June for medication reassessment. - Continue to monitor blood pressure at home. - Discuss medication plan with cardiologist in June. - Encourage low sodium diet, continued exercise and weight loss.

## 2023-08-16 NOTE — Patient Instructions (Addendum)
 Welcome to [facility], It was a pleasure meeting you today!   I will review your lab results via MyChart in a few days.  You look great! Keep up good work in your weight loss journey! Stay well and enjoy the Spring Season.     PLEASE NOTE: If you had any LAB tests please let us  know if you have not heard back within a few days. You may see your results on MyChart before we have a chance to review them but we will give you a call once they are reviewed by us . If we ordered any REFERRALS today, please let us  know if you have not heard from their office within the next week.  Let us  know through MyChart if you are needing REFILLS, or have your pharmacy send us  the request. You can also use MyChart to communicate with me or any office staff.  Please try these tips to maintain a healthy lifestyle: It is important that you exercise regularly at least 30 minutes 5 times a week. Think about what you will eat, plan ahead. Choose whole foods, & think  "clean, green, fresh or frozen" over canned, processed or packaged foods which are more sugary, salty, and fatty. 70 to 75% of food eaten should be fresh vegetables and protein. 2-3  meals daily with healthy snacks between meals, but must be whole fruit, protein or vegetables. Aim to eat over a 10 hour period when you are active, for example, 7am to 5pm, and then STOP after your last meal of the day, drinking only water.  Shorter eating windows, 6-8 hours, are showing benefits in heart disease and blood sugar regulation. Drink water every day! Shoot for 64 ounces daily = 8 cups, no other drink is as healthy! Fruit juice is best enjoyed in a healthy way, by EATING the fruit.

## 2023-08-16 NOTE — Assessment & Plan Note (Signed)
 Weight reduced from 276 lbs to 240 lbs since June 2023. On Zepbound for weight loss through [facility], tolerating well. Regular exercise expected to improve health, including cholesterol and blood pressure. - Continue Zepbound for weight management. - Encourage continued exercise regimen. - Monitor weight and adjust plan as needed.

## 2023-08-18 NOTE — Telephone Encounter (Signed)
 [facility]

## 2023-08-19 ENCOUNTER — Encounter: Payer: Self-pay | Admitting: Family

## 2023-08-30 ENCOUNTER — Other Ambulatory Visit (HOSPITAL_COMMUNITY): Payer: Self-pay

## 2023-08-30 ENCOUNTER — Encounter (INDEPENDENT_AMBULATORY_CARE_PROVIDER_SITE_OTHER): Payer: Self-pay | Admitting: Internal Medicine

## 2023-08-30 ENCOUNTER — Ambulatory Visit (INDEPENDENT_AMBULATORY_CARE_PROVIDER_SITE_OTHER): Admitting: Internal Medicine

## 2023-08-30 VITALS — BP 124/73 | HR 93 | Temp 98.7°F | Ht 64.0 in | Wt 235.0 lb

## 2023-08-30 DIAGNOSIS — E7849 Other hyperlipidemia: Secondary | ICD-10-CM | POA: Diagnosis not present

## 2023-08-30 DIAGNOSIS — E66813 Obesity, class 3: Secondary | ICD-10-CM

## 2023-08-30 DIAGNOSIS — I10 Essential (primary) hypertension: Secondary | ICD-10-CM | POA: Diagnosis not present

## 2023-08-30 DIAGNOSIS — G4733 Obstructive sleep apnea (adult) (pediatric): Secondary | ICD-10-CM

## 2023-08-30 DIAGNOSIS — R7303 Prediabetes: Secondary | ICD-10-CM | POA: Diagnosis not present

## 2023-08-30 DIAGNOSIS — Z6841 Body Mass Index (BMI) 40.0 and over, adult: Secondary | ICD-10-CM

## 2023-08-30 MED ORDER — ZEPBOUND 10 MG/0.5ML ~~LOC~~ SOAJ
10.0000 mg | SUBCUTANEOUS | 1 refills | Status: DC
  Filled 2023-08-30: qty 2, 28d supply, fill #0

## 2023-08-30 NOTE — Progress Notes (Signed)
 Office: [phone]  /  Fax: [phone]  Weight Summary And Biometrics  Vitals Temp: 98.7 F (37.1 C) BP: 124/73 Pulse Rate: 93 SpO2: 98 %   Anthropometric Measurements Height: 5\' 4"  (1.626 m) Weight: 235 lb (106.6 kg) BMI (Calculated): 40.32 Weight at Last Visit: 242 lb Weight Lost Since Last Visit: 7 lb Weight Gained Since Last Visit: 0 lb Starting Weight: 274 l Total Weight Loss (lbs): 39 lb (17.7 kg) Peak Weight: 312 lb   Body Composition  Body Fat %: 47.7 % Fat Mass (lbs): 112.2 lbs Muscle Mass (lbs): 117 lbs Total Body Water (lbs): 90.4 lbs Visceral Fat Rating : 13    No data recorded Today's Visit #: 19  Starting Date: 02/04/22   Subjective   Chief Complaint: Obesity  Interval History Discussed the use of AI scribe software for clinical note transcription with the patient, who gave verbal consent to proceed.  History of Present Illness   Cassie Duncan is a 38 year old female who presents for medical weight management.  She has been on tirzepatide  10 mg once a week since March of last year and has lost a total of 77 pounds from her peak weight of 312 pounds. Since her last office visit, she has lost 7 pounds. She follows a low-carb pescatarian meal plan about 90% of the time, tracks her calories, incorporates more whole foods, maintains adequate hydration, and occasionally skips meals. She exercises seven days a week, engaging in strength training, cardio, and yoga for 60 to 90 minutes per session. She reports inadequate sleep and high levels of stress.  Hypertension is well controlled with labetalol. Her most recent renal parameters from April showed normal GFR and electrolytes.  She has sleep apnea, which is part of her associated conditions for weight management.  She has prediabetes, with her last A1c in March of this year being 5.4 and a recent fasting blood sugar of 73.  She has hyperlipidemia, with her LDL currently at 96, and normal  triglycerides and HDL cholesterol.  She experiences some constipation, which she manages with Beneful fiber supplement and occasionally Dulcolax. She notes an increase in constipation since starting the 10 mg dose of tirzepatide . No nausea or vomiting.        Challenges affecting patient progress: none.    Pharmacotherapy for weight management: She is currently taking Zepbound  with adequate clinical response  and experiencing the following side effects: Occasional constipation..   Assessment and Plan   Treatment Plan For Obesity:  Recommended Dietary Goals  Cassie Duncan is currently in the action stage of change. As such, her goal is to continue weight management plan. She has agreed to: keep a food journal with a target of  1400 calories per day and 90-120 grams of protein per day or 30-40 grams per meal.  Behavioral Health and Counseling  We discussed the following behavioral modification strategies today: increasing lean protein intake to established goals, increasing vegetables, increasing fiber rich foods, avoiding skipping meals, and increasing water intake .  Additional education and resources provided today: None  Recommended Physical Activity Goals  Cassie Duncan has been advised to work up to 150 minutes of moderate intensity aerobic activity a week and strengthening exercises 2-3 times per week for cardiovascular health, weight loss maintenance and preservation of muscle mass.   She has agreed to :  Continue current level of physical activity   Pharmacotherapy  We discussed various medication options to help Cassie Duncan with her weight loss efforts and we both  agreed to : adequate clinical response to anti-obesity medication, continue current regimen and do not recommend further increases in GLP-1 due to adequate clinical response   Associated Conditions Impacted by Obesity Treatment  Prediabetes  OSA (obstructive sleep apnea) -     Zepbound ; Inject 10 mg into the skin once a  week.  Dispense: 2 mL; Refill: 1  Class 3 severe obesity with serious comorbidity and body mass index (BMI) of 45.0 to 49.9 in adult, unspecified obesity type (HCC) -     Zepbound ; Inject 10 mg into the skin once a week.  Dispense: 2 mL; Refill: 1  Essential hypertension -     Zepbound ; Inject 10 mg into the skin once a week.  Dispense: 2 mL; Refill: 1  Other hyperlipidemia     Assessment and Plan    Obesity Achieved significant weight loss with current regimen, losing 77 pounds from a peak weight of 312 pounds, approximately 26% of total body weight. Continues on tirzepatide  (Zepbound ) 10 mg once a week. Engages in regular physical activity and follows a low-carb pescatarian meal plan. Reports occasional constipation, managed with fiber supplementation and Dulcolax as needed. Discussed importance of adequate protein intake (120-140 grams per day) and fiber (20-30 grams per day) to support muscle preservation and satiety. Encouraged to avoid excessive calorie restriction to prevent metabolic slowing and plateaus. Discussed the importance of intentional eating to avoid metabolic slowing . - Continue tirzepatide  (Zepbound ) 10 mg once a week. - Encourage regular physical activity and balanced diet. - Ensure adequate protein and fiber intake. - Avoid excessive calorie restriction. - Refill tirzepatide  prescription at [facility].  Abnormal food appetite Significant improvement in appetite control with GLP-1 receptor agonist therapy (Zepbound ). - Continue Zepbound  10 mg once a week.  Prediabetes Last HbA1c in March was 5.4%. Recent fasting blood glucose was 73 mg/dL. Continues on Zepbound  for pharmacoprophylaxis.  Hypertension Blood pressure is well controlled on current medication regimen. Continues on labetalol. Recent renal parameters from April showed normal GFR and electrolytes.  Hyperlipidemia LDL cholesterol has improved to 96 mg/dL. Triglycerides and HDL cholesterol  are within normal limits.   OSA On CPAP.  Overall she has lost close to 26% of total body weight considering her peak weight.  Continue current weight management strategy       Objective   Physical Exam:  Blood pressure 124/73, pulse 93, temperature 98.7 F (37.1 C), height 5\' 4"  (1.626 m), weight 235 lb (106.6 kg), last menstrual period 08/09/2023, SpO2 98%. Body mass index is 40.34 kg/m.  General: She is overweight, cooperative, alert, well developed, and in no acute distress. PSYCH: Has normal mood, affect and thought process.   HEENT: EOMI, sclerae are anicteric. Lungs: Normal breathing effort, no conversational dyspnea. Extremities: No edema.  Neurologic: No gross sensory or motor deficits. No tremors or fasciculations noted.    Diagnostic Data Reviewed:  BMET    Component Value Date/Time   NA 135 08/16/2023 1205   NA 138 03/04/2022 1056   K 4.0 08/16/2023 1205   CL 105 08/16/2023 1205   CO2 24 08/16/2023 1205   GLUCOSE 73 08/16/2023 1205   BUN 16 08/16/2023 1205   BUN 9 03/04/2022 1056   CREATININE 0.62 08/16/2023 1205   CREATININE 0.79 09/05/2015 1201   CALCIUM 9.4 08/16/2023 1205   GFRNONAA >60 08/31/2021 1821   Lab Results  Component Value Date   HGBA1C 5.4 07/08/2022   HGBA1C 5.8 (H) 02/23/2012   Lab Results  Component  Value Date   INSULIN  5.3 02/04/2022   Lab Results  Component Value Date   TSH 1.47 08/16/2023   CBC    Component Value Date/Time   WBC 4.9 08/16/2023 1205   RBC 4.37 08/16/2023 1205   HGB 12.8 08/16/2023 1205   HGB 11.7 03/04/2022 1056   HCT 39.2 08/16/2023 1205   HCT 35.0 03/04/2022 1056   PLT 308.0 08/16/2023 1205   MCV 89.5 08/16/2023 1205   MCV 87 03/04/2022 1056   MCH 29.0 03/04/2022 1056   MCH 29.4 08/31/2021 1821   MCHC 32.6 08/16/2023 1205   RDW 13.3 08/16/2023 1205   RDW 12.4 03/04/2022 1056   Iron Studies No results found for: "IRON", "TIBC", "FERRITIN", "IRONPCTSAT" Lipid Panel     Component Value  Date/Time   CHOL 144 08/16/2023 1205   CHOL 161 02/04/2022 0953   TRIG 39.0 08/16/2023 1205   HDL 40.20 08/16/2023 1205   HDL 47 02/04/2022 0953   CHOLHDL 4 08/16/2023 1205   VLDL 7.8 08/16/2023 1205   LDLCALC 96 08/16/2023 1205   LDLCALC 106 (H) 02/04/2022 0953   Hepatic Function Panel     Component Value Date/Time   PROT 7.4 08/16/2023 1205   PROT 7.3 03/04/2022 1056   ALBUMIN 4.0 08/16/2023 1205   ALBUMIN 3.9 03/04/2022 1056   AST 14 08/16/2023 1205   ALT 9 08/16/2023 1205   ALKPHOS 42 08/16/2023 1205   BILITOT 0.4 08/16/2023 1205   BILITOT 0.3 03/04/2022 1056      Component Value Date/Time   TSH 1.47 08/16/2023 1205   Nutritional Lab Results  Component Value Date   VD25OH 47.3 03/04/2022    Medications: Outpatient Encounter Medications as of 08/30/2023  Medication Sig   albuterol  (VENTOLIN  HFA) 108 (90 Base) MCG/ACT inhaler Inhale 2 puffs into the lungs every 6 (six) hours as needed for wheezing or shortness of breath.   Albuterol -Budesonide  (AIRSUPRA ) 90-80 MCG/ACT AERO Inhale 2 puffs into the lungs every 4 (four) hours as needed (Shortness of breath or chest tightness).   ascorbic acid (VITAMIN C) 250 MG tablet Take 1 tablet by mouth daily.   Cholecalciferol 10 MCG (400 UNIT) CAPS Take by mouth.   ELDERBERRY PO Take by mouth.   labetalol (NORMODYNE) 100 MG tablet 100 mg 2 (two) times daily.   Multiple Vitamin (MULTIVITAMIN) capsule Take 1 capsule by mouth daily.   Omega 3-6-9 Fatty Acids (ADULT OMEGA PLUS DHA PO) Take by mouth.   OVER THE COUNTER MEDICATION CTS 360 DIETARY SUPPLEMENT .Aaron Aas429 MG.. TAKES TWICE A DAY. TONALIN - DIETARY SUPPLEMENT 2000 MG TWICE ADAY   [DISCONTINUED] tirzepatide  (ZEPBOUND ) 10 MG/0.5ML Pen Inject 10 mg into the skin once a week.   tirzepatide  (ZEPBOUND ) 10 MG/0.5ML Pen Inject 10 mg into the skin once a week.   No facility-administered encounter medications on file as of 08/30/2023.     Follow-Up   Return in about 4 weeks (around  09/27/2023) for For Weight Mangement with Dr. Allie Area.Aaron Aas She was informed of the importance of frequent follow up visits to maximize her success with intensive lifestyle modifications for her multiple health conditions.  Attestation Statement   Reviewed by clinician on day of visit: allergies, medications, problem list, medical history, surgical history, family history, social history, and previous encounter notes.     Ladd Picker, MD

## 2023-09-29 ENCOUNTER — Ambulatory Visit (INDEPENDENT_AMBULATORY_CARE_PROVIDER_SITE_OTHER): Admitting: Internal Medicine

## 2023-09-29 ENCOUNTER — Other Ambulatory Visit (HOSPITAL_COMMUNITY): Payer: Self-pay

## 2023-09-29 VITALS — BP 124/79 | HR 82 | Temp 98.8°F | Ht 64.0 in | Wt 238.0 lb

## 2023-09-29 DIAGNOSIS — E66813 Obesity, class 3: Secondary | ICD-10-CM

## 2023-09-29 DIAGNOSIS — G4733 Obstructive sleep apnea (adult) (pediatric): Secondary | ICD-10-CM

## 2023-09-29 DIAGNOSIS — I10 Essential (primary) hypertension: Secondary | ICD-10-CM | POA: Diagnosis not present

## 2023-09-29 DIAGNOSIS — K5904 Chronic idiopathic constipation: Secondary | ICD-10-CM

## 2023-09-29 DIAGNOSIS — R638 Other symptoms and signs concerning food and fluid intake: Secondary | ICD-10-CM

## 2023-09-29 DIAGNOSIS — R7303 Prediabetes: Secondary | ICD-10-CM

## 2023-09-29 DIAGNOSIS — E785 Hyperlipidemia, unspecified: Secondary | ICD-10-CM

## 2023-09-29 DIAGNOSIS — Z6841 Body Mass Index (BMI) 40.0 and over, adult: Secondary | ICD-10-CM

## 2023-09-29 MED ORDER — ZEPBOUND 10 MG/0.5ML ~~LOC~~ SOAJ
10.0000 mg | SUBCUTANEOUS | 1 refills | Status: DC
  Filled 2023-09-29: qty 2, 28d supply, fill #0

## 2023-09-29 NOTE — Progress Notes (Signed)
 Office: [phone]  /  Fax: [phone]  Weight Summary And Biometrics  Vitals Temp: 98.8 F (37.1 C) BP: 124/79 Pulse Rate: 82 SpO2: 100 %   Anthropometric Measurements Height: 5\' 4"  (1.626 m) Weight: 238 lb (108 kg) BMI (Calculated): 40.83 Weight at Last Visit: 235lb Weight Lost Since Last Visit: 0lb Weight Gained Since Last Visit: 3lb Starting Weight: 274lb Total Weight Loss (lbs): 36 lb (16.3 kg) Peak Weight: 312lb   Body Composition  Body Fat %: 44 % Fat Mass (lbs): 105 lbs Muscle Mass (lbs): 126.8 lbs Total Body Water (lbs): 98.6 lbs Visceral Fat Rating : 12    No data recorded Today's Visit #: 20  Starting Date: 02/04/22   Subjective   Chief Complaint: Obesity  Interval History Discussed the use of AI scribe software for clinical note transcription with the patient, who gave verbal consent to proceed.  History of Present Illness   Cassie Duncan is a 38 year old female with prediabetes, sleep apnea, and essential hypertension who presents for medical weight management.  She follows a low-carb meal plan and practices portion control, adhering to her plan about 70-80% of the time. She tracks her calories and macros, eats more whole foods, gets the recommended amount of protein, maintains adequate hydration, but occasionally skips meals. She exercises seven days a week for 60-90 minutes, including strength training, cardio, and yoga. She experiences inadequate sleep but denies high levels of stress.  She is currently on semaglutide 10 mg once a week. She experiences constipation, which she manages with natural cleanses and is trying to adjust her fiber intake. She uses Benefiber and drinks decaf coffee with Premier Protein in the morning. Constipation worsened with the 10 mg dose, having not noticed it on lower dosages. Her bowel movements occur every two to three days, which has been her norm since childhood.  She has been off labetalol for about three  weeks, having tapered off slowly. She reports not feeling as fatigued since stopping the medication. Her blood pressure at home has been around 111.  She skips meals at least once a day, often lunch, and sometimes eats right before bed. Breakfast usually consists of a coffee protein shake and sometimes fruit. Lunch is often a snack rather than a full meal, and dinner is her larger meal. She feels hungry before bed due to skipping lunch.  She reports hair thinning, which she attributes to possibly not getting enough nutrition. She has been told by a hairdresser about the thinning. She also experienced a sugar high after eating a cupcake, which increased her cravings.       Challenges affecting patient progress: Skipping of meals.    Pharmacotherapy for weight management: She is currently taking Zepbound  with adequate clinical response  and experiencing the following side effects: Starting to experience some hair loss..   Assessment and Plan   Treatment Plan For Obesity:  Recommended Dietary Goals  [name] is currently in the action stage of change. As such, her goal is to continue weight management plan. She has agreed to: keep a food journal with a target of  1500 calories per day and 90-120 grams of protein per day or 30-40 grams per meal.  Behavioral Health and Counseling  We discussed the following behavioral modification strategies today: Advised against skipping meals to maintain adequate caloric intake I would like for her to try to at least get 1500 cal.  Additional education and resources provided today: None  Recommended Physical Activity Goals  [name] has been advised to work up to 150 minutes of moderate intensity aerobic activity a week and strengthening exercises 2-3 times per week for cardiovascular health, weight loss maintenance and preservation of muscle mass.   She has agreed to :  Continue current level of physical activity   Pharmacotherapy  We discussed  various medication options to help [name] with her weight loss efforts and we both agreed to : adequate clinical response to anti-obesity medication, continue current regimen and do not recommend further increases in GLP-1 due to adequate clinical response   Associated Conditions Impacted by Obesity Treatment  Prediabetes Assessment & Plan: Most recent A1c is  Lab Results  Component Value Date   HGBA1C 5.4 07/08/2022   HGBA1C 5.8 (H) 02/23/2012   And improved.  She is currently on Zepbound  10 mg once a week without side effects she will continue medication for pharmacoprophylaxis and weight management.  Check disease monitoring labs today please refer to orders    OSA (obstructive sleep apnea) -     Zepbound ; Inject 10 mg into the skin once a week.  Dispense: 2 mL; Refill: 1  Essential hypertension -     Zepbound ; Inject 10 mg into the skin once a week.  Dispense: 2 mL; Refill: 1  Class 3 severe obesity with serious comorbidity and body mass index (BMI) of 45.0 to 49.9 in adult  Abnormal food appetite  Chronic idiopathic constipation     Assessment and Plan    Constipation Constipation likely exacerbated by current medication (step out 10 mg weekly). Managed with natural remedies and fiber supplementation. Possible IBS with constipation. May affect bladder function, causing increased urinary frequency. - Continue natural remedies and fiber supplementation. - Maintain step out at 10 mg weekly to avoid worsening constipation.  Essential Hypertension Blood pressure well-controlled off labetalol, with home readings around 111 mmHg. In-office reading of 124-125 mmHg attributed to stress and running late. No antihypertensive medication needed. - Discontinue labetalol.  Prediabetes Fasting blood sugar normal. Previous A1c 5.4, consistently stable. No concerns for progression to diabetes.  Mild Hyperlipidemia Cholesterol levels improved to 96 mg/dL from 161 mg/dL.  Obesity  and abnormal food appetite Improved on Zepbound .  She has lost 36 pounds.  She unfortunately is skipping meals and was advised against this.  Bioimpedance shows improvements in body composition though.  We reviewed strategies to increase protein intake at lunch.  Continue current dose of GLP-1   General Health Maintenance Discussion on dietary habits and nutritional intake to support weight management and overall health. Emphasis on adequate protein intake and avoiding meal skipping to prevent muscle and nutritional deficiencies. Risk of hair and muscle loss if caloric intake is too low, especially with current exercise regimen. - Encourage prepackaged meals or meal prep options for adequate nutrition during lunch. - Recommend [place] or [place] yogurt with fruit and low-sugar granola for snacks. - Advise maintaining a minimum caloric intake of 1400-1500 calories per day, considering exercise regimen. - Ensure multivitamin intake.  Recording duration: 20 minutes        Objective   Physical Exam:  Blood pressure 124/79, pulse 82, temperature 98.8 F (37.1 C), height 5\' 4"  (1.626 m), weight 238 lb (108 kg), SpO2 100%. Body mass index is 40.85 kg/m.  General: She is overweight, cooperative, alert, well developed, and in no acute distress. PSYCH: Has normal mood, affect and thought process.   HEENT: EOMI, sclerae are anicteric. Lungs: Normal breathing effort, no conversational dyspnea. Extremities: No edema.  Neurologic: No gross sensory or motor deficits. No tremors or fasciculations noted.    Diagnostic Data Reviewed:  BMET    Component Value Date/Time   NA 135 08/16/2023 1205   NA 138 03/04/2022 1056   K 4.0 08/16/2023 1205   CL 105 08/16/2023 1205   CO2 24 08/16/2023 1205   GLUCOSE 73 08/16/2023 1205   BUN 16 08/16/2023 1205   BUN 9 03/04/2022 1056   CREATININE 0.62 08/16/2023 1205   CREATININE 0.79 09/05/2015 1201   CALCIUM 9.4 08/16/2023 1205   GFRNONAA >60  08/31/2021 1821   Lab Results  Component Value Date   HGBA1C 5.4 07/08/2022   HGBA1C 5.8 (H) 02/23/2012   Lab Results  Component Value Date   INSULIN  5.3 02/04/2022   Lab Results  Component Value Date   TSH 1.47 08/16/2023   CBC    Component Value Date/Time   WBC 4.9 08/16/2023 1205   RBC 4.37 08/16/2023 1205   HGB 12.8 08/16/2023 1205   HGB 11.7 03/04/2022 1056   HCT 39.2 08/16/2023 1205   HCT 35.0 03/04/2022 1056   PLT 308.0 08/16/2023 1205   MCV 89.5 08/16/2023 1205   MCV 87 03/04/2022 1056   MCH 29.0 03/04/2022 1056   MCH 29.4 08/31/2021 1821   MCHC 32.6 08/16/2023 1205   RDW 13.3 08/16/2023 1205   RDW 12.4 03/04/2022 1056   Iron Studies No results found for: "IRON", "TIBC", "FERRITIN", "IRONPCTSAT" Lipid Panel     Component Value Date/Time   CHOL 144 08/16/2023 1205   CHOL 161 02/04/2022 0953   TRIG 39.0 08/16/2023 1205   HDL 40.20 08/16/2023 1205   HDL 47 02/04/2022 0953   CHOLHDL 4 08/16/2023 1205   VLDL 7.8 08/16/2023 1205   LDLCALC 96 08/16/2023 1205   LDLCALC 106 (H) 02/04/2022 0953   Hepatic Function Panel     Component Value Date/Time   PROT 7.4 08/16/2023 1205   PROT 7.3 03/04/2022 1056   ALBUMIN 4.0 08/16/2023 1205   ALBUMIN 3.9 03/04/2022 1056   AST 14 08/16/2023 1205   ALT 9 08/16/2023 1205   ALKPHOS 42 08/16/2023 1205   BILITOT 0.4 08/16/2023 1205   BILITOT 0.3 03/04/2022 1056      Component Value Date/Time   TSH 1.47 08/16/2023 1205   Nutritional Lab Results  Component Value Date   VD25OH 47.3 03/04/2022    Medications: Outpatient Encounter Medications as of 09/29/2023  Medication Sig   albuterol  (VENTOLIN  HFA) 108 (90 Base) MCG/ACT inhaler Inhale 2 puffs into the lungs every 6 (six) hours as needed for wheezing or shortness of breath.   Albuterol -Budesonide  (AIRSUPRA ) 90-80 MCG/ACT AERO Inhale 2 puffs into the lungs every 4 (four) hours as needed (Shortness of breath or chest tightness).   ascorbic acid (VITAMIN C) 250 MG  tablet Take 1 tablet by mouth daily.   Cholecalciferol 10 MCG (400 UNIT) CAPS Take by mouth.   ELDERBERRY PO Take by mouth.   Multiple Vitamin (MULTIVITAMIN) capsule Take 1 capsule by mouth daily.   Omega 3-6-9 Fatty Acids (ADULT OMEGA PLUS DHA PO) Take by mouth.   OVER THE COUNTER MEDICATION CTS 360 DIETARY SUPPLEMENT .Aaron Aas429 MG.. TAKES TWICE A DAY. TONALIN - DIETARY SUPPLEMENT 2000 MG TWICE ADAY   [DISCONTINUED] tirzepatide  (ZEPBOUND ) 10 MG/0.5ML Pen Inject 10 mg into the skin once a week.   tirzepatide  (ZEPBOUND ) 10 MG/0.5ML Pen Inject 10 mg into the skin once a week.   [DISCONTINUED] labetalol (NORMODYNE) 100 MG tablet 100 mg  2 (two) times daily. (Patient not taking: Reported on 09/29/2023)   No facility-administered encounter medications on file as of 09/29/2023.     Follow-Up   Return in about 4 weeks (around 10/27/2023) for For Weight Mangement with Dr. Allie Area.Aaron Aas She was informed of the importance of frequent follow up visits to maximize her success with intensive lifestyle modifications for her multiple health conditions.  Attestation Statement   Reviewed by clinician on day of visit: allergies, medications, problem list, medical history, surgical history, family history, social history, and previous encounter notes.     Ladd Picker, MD

## 2023-09-29 NOTE — Assessment & Plan Note (Addendum)
 Most recent A1c is  Lab Results  Component Value Date   HGBA1C 5.4 07/08/2022   HGBA1C 5.8 (H) 02/23/2012   And improved.  She is currently on Zepbound  10 mg once a week without side effects she will continue medication for pharmacoprophylaxis and weight management.  Check disease monitoring labs today please refer to orders

## 2023-10-11 ENCOUNTER — Other Ambulatory Visit (HOSPITAL_COMMUNITY): Payer: Self-pay

## 2023-10-27 ENCOUNTER — Encounter (INDEPENDENT_AMBULATORY_CARE_PROVIDER_SITE_OTHER): Payer: Self-pay | Admitting: Internal Medicine

## 2023-10-27 ENCOUNTER — Other Ambulatory Visit (HOSPITAL_COMMUNITY): Payer: Self-pay

## 2023-10-27 ENCOUNTER — Ambulatory Visit (INDEPENDENT_AMBULATORY_CARE_PROVIDER_SITE_OTHER): Payer: Self-pay | Admitting: Internal Medicine

## 2023-10-27 VITALS — BP 127/83 | HR 89 | Temp 98.4°F | Ht 64.0 in | Wt 239.0 lb

## 2023-10-27 DIAGNOSIS — G4733 Obstructive sleep apnea (adult) (pediatric): Secondary | ICD-10-CM | POA: Diagnosis not present

## 2023-10-27 DIAGNOSIS — I10 Essential (primary) hypertension: Secondary | ICD-10-CM | POA: Diagnosis not present

## 2023-10-27 DIAGNOSIS — E669 Obesity, unspecified: Secondary | ICD-10-CM

## 2023-10-27 DIAGNOSIS — K5904 Chronic idiopathic constipation: Secondary | ICD-10-CM

## 2023-10-27 DIAGNOSIS — R7303 Prediabetes: Secondary | ICD-10-CM | POA: Diagnosis not present

## 2023-10-27 DIAGNOSIS — Z6841 Body Mass Index (BMI) 40.0 and over, adult: Secondary | ICD-10-CM

## 2023-10-27 MED ORDER — ZEPBOUND 10 MG/0.5ML ~~LOC~~ SOAJ
10.0000 mg | SUBCUTANEOUS | 1 refills | Status: DC
Start: 2023-10-27 — End: 2023-11-28
  Filled 2023-10-27: qty 2, 28d supply, fill #0

## 2023-10-27 NOTE — Progress Notes (Signed)
 Office: [phone]  /  Fax: [phone]  Weight Summary and Body Composition Analysis (BIA)  Vitals Temp: 98.4 F (36.9 C) BP: 127/83 Pulse Rate: 89 SpO2: 99 %   Anthropometric Measurements Height: 5' 4 (1.626 m) Weight: 239 lb (108.4 kg) BMI (Calculated): 41 Weight at Last Visit: 238 lb Weight Lost Since Last Visit: 0 lb Weight Gained Since Last Visit: 1 lb Starting Weight: 274 lb Total Weight Loss (lbs): 35 lb (15.9 kg) Peak Weight: 312 lb   Body Composition  Body Fat %: 48.1 % Fat Mass (lbs): 115.4 lbs Muscle Mass (lbs): 118.2 lbs Visceral Fat Rating : 13    RMR: 1814  Today's Visit #: 21  Starting Date: 02/04/22   Subjective   Chief Complaint: Obesity  Interval History Cassie Duncan is a 38 year old female who presents for medical weight management.  Since the last visit, she has gained one pound despite following a low-carb diet with good adherence, tracking her calories, consuming more whole foods, and maintaining adequate hydration. She exercises seven days a week, engaging in sixty to ninety minutes of strength and cardio workouts daily.  She recently traveled to [place]  to visit family, which she believes has impacted her weight. Although she prepared for the trip by ordering protein shakes and other items, she indulged in family cooking and a cookout. She typically experiences weight gain during her menstrual cycle due to bloating, noting a weight increase of nine to ten pounds from last week, which she attributes to her cycle and travel.  She is currently on a 10 mg dose of Zepbound  but is hesitant to increase it due to concerns about constipation, which has worsened since increasing from 7.5 mg. She feels full and unable to finish meals, attributing this to the medication's effectiveness.  Her typical daily diet includes decaf coffee with a protein shake, a fruit platter, [place] yogurt or salad for lunch, and a protein shake or protein powder  with spinach. Dinner varies depending on her schedule, and she sometimes skips it due to lack of hunger or fatigue. She is mindful of her protein intake, aiming for 90 to 120 grams per day.  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  Challenges affecting patient progress: none.    Pharmacotherapy for weight management: She is currently taking Zepbound  with adequate clinical response  and without side effects..   Assessment and Plan   Treatment Plan For Obesity:  Recommended Dietary Goals  Cassie Duncan is currently in the action stage of change. As such, her goal is to continue weight management plan. She has agreed to: continue current plan  Behavioral Health and Counseling  We discussed the following behavioral modification strategies today: continue to work on maintaining a reduced calorie state, getting the recommended amount of protein, incorporating whole foods, making healthy choices, staying well hydrated and practicing mindfulness when eating..  Additional education and resources provided today: None  Recommended Physical Activity Goals  Cassie Duncan has been advised to work up to 150 minutes of moderate intensity aerobic activity a week and strengthening exercises 2-3 times per week for cardiovascular health, weight loss maintenance and preservation of muscle mass.   She has agreed to :  Continue current level of physical activity   Medical Interventions and Pharmacotherapy  We discussed various medication options to help Cassie Duncan with her weight loss efforts and we both agreed to : Adequate clinical response to anti-obesity  medication, continue current regimen  Associated Conditions Impacted by Obesity Treatment  Chronic idiopathic constipation  OSA (obstructive sleep apnea) -     Zepbound ; Inject 10 mg into the skin once a week.  Dispense: 2 mL; Refill: 1  Essential hypertension -     Zepbound ; Inject 10 mg into  the skin once a week.  Dispense: 2 mL; Refill: 1  Prediabetes     Assessment and Plan    Obesity She is undergoing medical weight management and has gained one pound since the last visit. She adheres to a low-carb diet, tracks calories, consumes whole foods, and maintains hydration. Exercises daily with strength and cardio for 60 to 90 minutes. Currently on 10 mg of Zepbound , which aids satiety and hunger but causes constipation. She has lost 24% of her total body weight from a peak of 312 lbs, surpassing the average response to the medication. Concerned about potential lifetime medication or surgical interventions. Discussed maintaining adequate protein intake to prevent metabolic slowdown and emphasized dietary consistency. Explained that with Zepbound , typical weight loss is 20-22% at maximum dose, and she has surpassed this with 23% weight loss at 10 mg. - Continue Zepbound  at 10 mg. - Monitor weight and dietary intake over the next four weeks. - Ensure adequate protein intake (90-120 grams per day) to support metabolic rate. - Consider increasing medication dose if weight loss plateaus or slows significantly.  Prediabetes and sleep apnea Continue Zepbound  for weight management and diabetes prevention  Constipation Experiences constipation as a side effect of the 10 mg dose of Zepbound . Constipation was less severe at lower doses of 5 mg and 7.5 mg. Acknowledged that constipation is a limiting factor in increasing the medication dose. - Manage constipation symptoms as needed. - Reassess constipation management strategies if considering increasing Zepbound  dose.          Objective   Physical Exam:  Blood pressure 127/83, pulse 89, temperature 98.4 F (36.9 C), height 5' 4 (1.626 m), weight 239 lb (108.4 kg), last menstrual period 10/20/2023, SpO2 99%. Body mass index is 41.02 kg/m.  General: She is overweight, cooperative, alert, well developed, and in no acute  distress. PSYCH: Has normal mood, affect and thought process.   HEENT: EOMI, sclerae are anicteric. Lungs: Normal breathing effort, no conversational dyspnea. Extremities: No edema.  Neurologic: No gross sensory or motor deficits. No tremors or fasciculations noted.    Diagnostic Data Reviewed:  BMET    Component Value Date/Time   NA 135 08/16/2023 1205   NA 138 03/04/2022 1056   K 4.0 08/16/2023 1205   CL 105 08/16/2023 1205   CO2 24 08/16/2023 1205   GLUCOSE 73 08/16/2023 1205   BUN 16 08/16/2023 1205   BUN 9 03/04/2022 1056   CREATININE 0.62 08/16/2023 1205   CREATININE 0.79 09/05/2015 1201   CALCIUM 9.4 08/16/2023 1205   GFRNONAA >60 08/31/2021 1821   Lab Results  Component Value Date   HGBA1C 5.4 07/08/2022   HGBA1C 5.8 (H) 02/23/2012   Lab Results  Component Value Date   INSULIN  5.3 02/04/2022   Lab Results  Component Value Date   TSH 1.47 08/16/2023   CBC    Component Value Date/Time   WBC 4.9 08/16/2023 1205   RBC 4.37 08/16/2023 1205   HGB 12.8 08/16/2023 1205   HGB 11.7 03/04/2022 1056   HCT 39.2 08/16/2023 1205   HCT 35.0 03/04/2022 1056   PLT 308.0 08/16/2023 1205   MCV 89.5 08/16/2023 1205  MCV 87 03/04/2022 1056   MCH 29.0 03/04/2022 1056   MCH 29.4 08/31/2021 1821   MCHC 32.6 08/16/2023 1205   RDW 13.3 08/16/2023 1205   RDW 12.4 03/04/2022 1056   Iron Studies No results found for: IRON, TIBC, FERRITIN, IRONPCTSAT Lipid Panel     Component Value Date/Time   CHOL 144 08/16/2023 1205   CHOL 161 02/04/2022 0953   TRIG 39.0 08/16/2023 1205   HDL 40.20 08/16/2023 1205   HDL 47 02/04/2022 0953   CHOLHDL 4 08/16/2023 1205   VLDL 7.8 08/16/2023 1205   LDLCALC 96 08/16/2023 1205   LDLCALC 106 (H) 02/04/2022 0953   Hepatic Function Panel     Component Value Date/Time   PROT 7.4 08/16/2023 1205   PROT 7.3 03/04/2022 1056   ALBUMIN 4.0 08/16/2023 1205   ALBUMIN 3.9 03/04/2022 1056   AST 14 08/16/2023 1205   ALT 9 08/16/2023  1205   ALKPHOS 42 08/16/2023 1205   BILITOT 0.4 08/16/2023 1205   BILITOT 0.3 03/04/2022 1056      Component Value Date/Time   TSH 1.47 08/16/2023 1205   Nutritional Lab Results  Component Value Date   VD25OH 47.3 03/04/2022    Medications: Outpatient Encounter Medications as of 10/27/2023  Medication Sig   albuterol  (VENTOLIN  HFA) 108 (90 Base) MCG/ACT inhaler Inhale 2 puffs into the lungs every 6 (six) hours as needed for wheezing or shortness of breath.   Albuterol -Budesonide  (AIRSUPRA ) 90-80 MCG/ACT AERO Inhale 2 puffs into the lungs every 4 (four) hours as needed (Shortness of breath or chest tightness).   ascorbic acid (VITAMIN C) 250 MG tablet Take 1 tablet by mouth daily.   Cholecalciferol 10 MCG (400 UNIT) CAPS Take by mouth.   ELDERBERRY PO Take by mouth.   Multiple Vitamin (MULTIVITAMIN) capsule Take 1 capsule by mouth daily.   Omega 3-6-9 Fatty Acids (ADULT OMEGA PLUS DHA PO) Take by mouth.   OVER THE COUNTER MEDICATION CTS 360 DIETARY SUPPLEMENT .SABRA429 MG.. TAKES TWICE A DAY. TONALIN - DIETARY SUPPLEMENT 2000 MG TWICE ADAY   tirzepatide  (ZEPBOUND ) 10 MG/0.5ML Pen Inject 10 mg into the skin once a week.   [DISCONTINUED] tirzepatide  (ZEPBOUND ) 10 MG/0.5ML Pen Inject 10 mg into the skin once a week.   No facility-administered encounter medications on file as of 10/27/2023.     Follow-Up   Return in about 4 weeks (around 11/24/2023) for For Weight Mangement with Dr. Francyne.SABRA She was informed of the importance of frequent follow up visits to maximize her success with intensive lifestyle modifications for her multiple health conditions.  Attestation Statement   Reviewed by clinician on day of visit: allergies, medications, problem list, medical history, surgical history, family history, social history, and previous encounter notes.     Lucas Francyne, MD

## 2023-11-08 ENCOUNTER — Other Ambulatory Visit (HOSPITAL_COMMUNITY): Payer: Self-pay

## 2023-11-11 ENCOUNTER — Ambulatory Visit: Payer: Self-pay | Admitting: Primary Care

## 2023-11-21 ENCOUNTER — Telehealth (INDEPENDENT_AMBULATORY_CARE_PROVIDER_SITE_OTHER): Payer: Self-pay | Admitting: Internal Medicine

## 2023-11-21 NOTE — Telephone Encounter (Signed)
 Pt called in stating she has not taken her Zepbound  in 4 weeks. She wants to know if it is ok for her to start taking Zepbound  10 mg again? Please follow up with pt, pt stated if she misses your call you can leave a voicemail message with the answer.

## 2023-11-24 ENCOUNTER — Telehealth (INDEPENDENT_AMBULATORY_CARE_PROVIDER_SITE_OTHER): Payer: Self-pay

## 2023-11-24 ENCOUNTER — Other Ambulatory Visit (HOSPITAL_COMMUNITY): Payer: Self-pay

## 2023-11-24 DIAGNOSIS — G4733 Obstructive sleep apnea (adult) (pediatric): Secondary | ICD-10-CM

## 2023-11-24 DIAGNOSIS — I10 Essential (primary) hypertension: Secondary | ICD-10-CM

## 2023-11-24 MED ORDER — TIRZEPATIDE-WEIGHT MANAGEMENT 5 MG/0.5ML ~~LOC~~ SOAJ
5.0000 mg | SUBCUTANEOUS | 0 refills | Status: DC
  Filled 2023-11-24 (×2): qty 2, 28d supply, fill #0

## 2023-11-25 ENCOUNTER — Other Ambulatory Visit (HOSPITAL_COMMUNITY): Payer: Self-pay

## 2023-11-25 NOTE — Telephone Encounter (Signed)
 Copied from CRM [id number]. Topic: General - Other >> Nov 24, 2023  3:59 PM Shona S wrote: Reason for CRM: Spence,Trina CMA is calling because [facility] needs patient sleep study test results including her AHI number, please fax to [phone] Phone number of practice: [phone]    Per chart sleep study ordered/resulted by [facility]. There is not a copy in the chart that I can find.   Patient or office will need to contact [facility] for release of records. Thank you!

## 2023-11-28 ENCOUNTER — Other Ambulatory Visit (HOSPITAL_COMMUNITY): Payer: Self-pay

## 2023-11-28 ENCOUNTER — Ambulatory Visit (INDEPENDENT_AMBULATORY_CARE_PROVIDER_SITE_OTHER): Admitting: Internal Medicine

## 2023-11-28 ENCOUNTER — Encounter (INDEPENDENT_AMBULATORY_CARE_PROVIDER_SITE_OTHER): Payer: Self-pay | Admitting: Internal Medicine

## 2023-11-28 ENCOUNTER — Encounter (INDEPENDENT_AMBULATORY_CARE_PROVIDER_SITE_OTHER): Payer: Self-pay

## 2023-11-28 VITALS — BP 106/72 | HR 87 | Temp 98.3°F | Ht 64.0 in | Wt 237.0 lb

## 2023-11-28 DIAGNOSIS — Z6841 Body Mass Index (BMI) 40.0 and over, adult: Secondary | ICD-10-CM

## 2023-11-28 DIAGNOSIS — G4733 Obstructive sleep apnea (adult) (pediatric): Secondary | ICD-10-CM

## 2023-11-28 DIAGNOSIS — I10 Essential (primary) hypertension: Secondary | ICD-10-CM

## 2023-11-28 DIAGNOSIS — E66813 Obesity, class 3: Secondary | ICD-10-CM

## 2023-11-28 DIAGNOSIS — R7303 Prediabetes: Secondary | ICD-10-CM | POA: Diagnosis not present

## 2023-11-28 MED ORDER — ZEPBOUND 10 MG/0.5ML ~~LOC~~ SOAJ
10.0000 mg | SUBCUTANEOUS | 1 refills | Status: DC
  Filled 2023-11-28 – 2024-01-25 (×2): qty 2, 28d supply, fill #0

## 2023-11-28 NOTE — Progress Notes (Signed)
 Office: [phone]  /  Fax: [phone]  Weight Summary and Body Composition Analysis (BIA)  Vitals Temp: 98.3 F (36.8 C) BP: 106/72 Pulse Rate: 87 SpO2: 99 %   Anthropometric Measurements Height: 5' 4 (1.626 m) Weight: 237 lb (107.5 kg) BMI (Calculated): 40.66 Weight at Last Visit: 239 lb Weight Lost Since Last Visit: 2 lb Weight Gained Since Last Visit: 0 Starting Weight: 274 lb Total Weight Loss (lbs): 37 lb (16.8 kg) Peak Weight: 312 lb   Body Composition  Body Fat %: 44.8 % Fat Mass (lbs): 106.4 lbs Muscle Mass (lbs): 124.6 lbs Total Body Water (lbs): 96.8 lbs Visceral Fat Rating : 12    RMR: 1814  Today's Visit #: 22  Starting Date: 02/04/22   Subjective   Chief Complaint: Obesity  Interval History Discussed the use of AI scribe software for clinical note transcription with the patient, who gave verbal consent to proceed.  History of Present Illness   Cassie Duncan is a 38 year old female who presents for medical weight management.  Associated comorbidities include hypertension moderate sleep apnea with an AHI of 21, hypercholesterolemia, prediabetes and abnormal food appetite.   Since last office visit she has lost 2 pounds she reports following a low-carb reduced calorie nutrition plan with good adherence.  She recently had a lapse in Zepbound  due to finances she has been paying for medication out-of-pocket.  Because there was a least 4 weeks of treatment interruption we recommended starting at a lower dose.  She is now on Zepbound  5 mg once a week.  Has been on medication since March 2024 and has lost approximately 60 pounds since starting medication.  She experiences intense hunger, describing it as 'very, very, very hungry a lot,' which is more intense than her baseline hunger when she ran out of medication.  She has been working with a trainer to manage her weight and has recently lost two pounds. Tracking her food intake and having  accountability from her trainer has helped her regain control over her eating habits. She is trying to eat more at home, focusing on healthy foods, but finds she needs more volume to feel full.  She has a history of using phentermine , which she associates with heart palpitations and does not wish to use again.   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 This has added stress to her life, which she acknowledges may be impacting her eating habits.       Challenges affecting patient progress: multiple competing priorities, work schedule, and having difficulties with GLP-1 or AOM coverage.    Pharmacotherapy for weight management: She is currently taking Zepbound  with adequate clinical response  and without side effects..   Assessment and Plan   Treatment Plan For Obesity:  Recommended Dietary Goals  Cassie Duncan is currently in the action stage of change. As such, her goal is to continue weight management plan. She has agreed to: continue current plan  Behavioral Health and Counseling  We discussed the following behavioral modification strategies today: increasing lean protein intake to established goals, increasing fiber rich foods, avoiding skipping meals, avoiding temptations and identifying enticing environmental cues, continue to work on implementation of reduced calorie nutritional plan, continue to practice mindfulness when eating, and planning for success.  Additional education and resources provided today: None  Recommended Physical Activity Goals  Cassie Duncan has been advised to work up  to 150 minutes of moderate intensity aerobic activity a week and strengthening exercises 2-3 times per week for cardiovascular health, weight loss maintenance and preservation of muscle mass.   She has agreed to :  Continue current level of physical activity   Medical  Interventions and Pharmacotherapy  We discussed various medication options to help Cassie Duncan with her weight loss efforts and we both agreed to : Increase anti-obesity medication.  In addition to reduced calorie nutrition plan (RCNP), behavioral strategies and physical activity, Cassie Duncan would benefit from ongoing pharmacotherapy with Zepbound  to assist with hunger signals, satiety and cravings. This will reduce obesity-related health risks by inducing weight loss, and help reduce food consumption and adherence to [facility]) . It may also improve QOL by improving self-confidence and reduce the  setbacks associated with metabolic adaptations.  She has several high risk conditions including: Sleep apnea of moderate intensity, prediabetes, hypertension and hyperlipidemia.  She did not tolerate phentermine  in the past due to palpitations.  After discussion of treatment options, mechanisms of action, benefits, side effects, contraindications and shared decision making she is agreeable to increasing Zepbound  10 mg once a week.  She will try to use her insurance this time around.  Patient also made aware that medication is indicated for long-term management of obesity and the risk of weight regain following discontinuation of treatment and hence the importance of adhering to medical weight loss plan.    Associated Conditions Impacted by Obesity Treatment  Assessment & Plan OSA (obstructive sleep apnea) - Sleep study 06/18/20 showed moderate OSA, AHI 21.4/hr with SpO2 low 87%. Started on CPAP in January 2023.  She reports good compliance with CPAP treatment.  She has lost approximately 60 pounds with structured nutritional plan inclusive of GLP-1.  Patient benefits from ongoing treatment with Zepbound  she will try to get medication coverage through her insurance. Essential hypertension Blood pressure is well-controlled off labetalol.  Tirzepatide  helps with blood pressure control continue current  regimen Prediabetes Most recent A1c is  Lab Results  Component Value Date   HGBA1C 5.4 07/08/2022   HGBA1C 5.8 (H) 02/23/2012   And improved.  Because of recent lapse she is now on Zepbound  5 mg once a week versus 10 mg.  Class 3 severe obesity with serious comorbidity and body mass index (BMI) of 40.0 to 44.9 in adult, unspecified obesity type From a peak weight of 297 she has lost approximately 60 pounds which represents 21% of total body weight on structured nutritional plan inclusive of treatment with Zepbound .  Patient benefits from ongoing pharmacotherapy but cost is becoming an issue.  She has FDA approved medical condition.  She has OSA of moderate severity.  She will try to get medication covered by her insurance.    General Health Maintenance She is actively working on weight management and lifestyle changes, including dietary adjustments and exercise with a trainer. Financial impact of dietary changes is a concern. - Continue dietary adjustments and exercise regimen. - Monitor financial impact of dietary changes and adjust as needed.        Objective   Physical Exam:  Blood pressure 106/72, pulse 87, temperature 98.3 F (36.8 C), height 5' 4 (1.626 m), weight 237 lb (107.5 kg), SpO2 99%. Body mass index is 40.68 kg/m.  General: She is overweight, cooperative, alert, well developed, and in no acute distress. PSYCH: Has normal mood, affect and thought process.   HEENT: EOMI, sclerae are anicteric. Lungs: Normal breathing effort, no conversational dyspnea. Extremities: No edema.  Neurologic: No gross sensory or motor deficits. No tremors or fasciculations noted.    Diagnostic Data Reviewed:  BMET    Component Value Date/Time   NA 135 08/16/2023 1205   NA 138 03/04/2022 1056   K 4.0 08/16/2023 1205   CL 105 08/16/2023 1205   CO2 24 08/16/2023 1205   GLUCOSE 73 08/16/2023 1205   BUN 16 08/16/2023 1205   BUN 9 03/04/2022 1056   CREATININE 0.62 08/16/2023 1205    CREATININE 0.79 09/05/2015 1201   CALCIUM 9.4 08/16/2023 1205   GFRNONAA >60 08/31/2021 1821   Lab Results  Component Value Date   HGBA1C 5.4 07/08/2022   HGBA1C 5.8 (H) 02/23/2012   Lab Results  Component Value Date   INSULIN  5.3 02/04/2022   Lab Results  Component Value Date   TSH 1.47 08/16/2023   CBC    Component Value Date/Time   WBC 4.9 08/16/2023 1205   RBC 4.37 08/16/2023 1205   HGB 12.8 08/16/2023 1205   HGB 11.7 03/04/2022 1056   HCT 39.2 08/16/2023 1205   HCT 35.0 03/04/2022 1056   PLT 308.0 08/16/2023 1205   MCV 89.5 08/16/2023 1205   MCV 87 03/04/2022 1056   MCH 29.0 03/04/2022 1056   MCH 29.4 08/31/2021 1821   MCHC 32.6 08/16/2023 1205   RDW 13.3 08/16/2023 1205   RDW 12.4 03/04/2022 1056   Iron Studies No results found for: IRON, TIBC, FERRITIN, IRONPCTSAT Lipid Panel     Component Value Date/Time   CHOL 144 08/16/2023 1205   CHOL 161 02/04/2022 0953   TRIG 39.0 08/16/2023 1205   HDL 40.20 08/16/2023 1205   HDL 47 02/04/2022 0953   CHOLHDL 4 08/16/2023 1205   VLDL 7.8 08/16/2023 1205   LDLCALC 96 08/16/2023 1205   LDLCALC 106 (H) 02/04/2022 0953   Hepatic Function Panel     Component Value Date/Time   PROT 7.4 08/16/2023 1205   PROT 7.3 03/04/2022 1056   ALBUMIN 4.0 08/16/2023 1205   ALBUMIN 3.9 03/04/2022 1056   AST 14 08/16/2023 1205   ALT 9 08/16/2023 1205   ALKPHOS 42 08/16/2023 1205   BILITOT 0.4 08/16/2023 1205   BILITOT 0.3 03/04/2022 1056      Component Value Date/Time   TSH 1.47 08/16/2023 1205   Nutritional Lab Results  Component Value Date   VD25OH 47.3 03/04/2022    Medications: Outpatient Encounter Medications as of 11/28/2023  Medication Sig   albuterol  (VENTOLIN  HFA) 108 (90 Base) MCG/ACT inhaler Inhale 2 puffs into the lungs every 6 (six) hours as needed for wheezing or shortness of breath.   Albuterol -Budesonide  (AIRSUPRA ) 90-80 MCG/ACT AERO Inhale 2 puffs into the lungs every 4 (four) hours as  needed (Shortness of breath or chest tightness).   ascorbic acid (VITAMIN C) 250 MG tablet Take 1 tablet by mouth daily.   Cholecalciferol 10 MCG (400 UNIT) CAPS Take by mouth.   ELDERBERRY PO Take by mouth.   Multiple Vitamin (MULTIVITAMIN) capsule Take 1 capsule by mouth daily.   Omega 3-6-9 Fatty Acids (ADULT OMEGA PLUS DHA PO) Take by mouth.   OVER THE COUNTER MEDICATION CTS 360 DIETARY SUPPLEMENT .SABRA429 MG.. TAKES TWICE A DAY. TONALIN - DIETARY SUPPLEMENT 2000 MG TWICE ADAY   [DISCONTINUED] tirzepatide  (ZEPBOUND ) 10 MG/0.5ML Pen Inject 10 mg into the skin once a week.   [DISCONTINUED] tirzepatide  (ZEPBOUND ) 5 MG/0.5ML Pen Inject 5 mg into the skin once a week.   tirzepatide  (ZEPBOUND ) 10 MG/0.5ML Pen Inject 10  mg into the skin once a week.   No facility-administered encounter medications on file as of 11/28/2023.     Follow-Up   No follow-ups on file.SABRA She was informed of the importance of frequent follow up visits to maximize her success with intensive lifestyle modifications for her multiple health conditions.  Attestation Statement   Reviewed by clinician on day of visit: allergies, medications, problem list, medical history, surgical history, family history, social history, and previous encounter notes.     Lucas Parker, MD

## 2023-11-28 NOTE — Assessment & Plan Note (Signed)
 Blood pressure is well-controlled off labetalol.  Tirzepatide  helps with blood pressure control continue current regimen

## 2023-11-28 NOTE — Assessment & Plan Note (Signed)
-   Sleep study 06/18/20 showed moderate OSA, AHI 21.4/hr with SpO2 low 87%. Started on CPAP in January 2023.  She reports good compliance with CPAP treatment.  She has lost approximately 60 pounds with structured nutritional plan inclusive of GLP-1.  Patient benefits from ongoing treatment with Zepbound  she will try to get medication coverage through her insurance.

## 2023-11-28 NOTE — Telephone Encounter (Signed)
 Spoke to patient today at appointment.  Patient will call [facility] and get results faxed over to us  for sleep study.

## 2023-11-28 NOTE — Assessment & Plan Note (Signed)
 Most recent A1c is  Lab Results  Component Value Date   HGBA1C 5.4 07/08/2022   HGBA1C 5.8 (H) 02/23/2012   And improved.  Because of recent lapse she is now on Zepbound  5 mg once a week versus 10 mg.

## 2023-11-29 ENCOUNTER — Ambulatory Visit (INDEPENDENT_AMBULATORY_CARE_PROVIDER_SITE_OTHER): Admitting: Internal Medicine

## 2023-11-29 ENCOUNTER — Other Ambulatory Visit (HOSPITAL_COMMUNITY): Payer: Self-pay

## 2023-12-08 ENCOUNTER — Telehealth (INDEPENDENT_AMBULATORY_CARE_PROVIDER_SITE_OTHER): Payer: Self-pay

## 2023-12-08 ENCOUNTER — Other Ambulatory Visit (HOSPITAL_COMMUNITY): Payer: Self-pay

## 2023-12-08 NOTE — Telephone Encounter (Signed)
 Prior auth submitted for Zepbound  10 mg for OSA.  Patient has been paying out of pocket for this medication since the first dose. We are awaiting their determination.

## 2023-12-12 NOTE — Telephone Encounter (Signed)
 Message from Plan Denied. This health benefit plan does not cover the following services, supplies, drugs or charges: Any treatment or regimen, medical or surgical, for the purpose of reducing or controlling the weight of the member, or for the treatment of obesity, except for surgical treatment of morbid obesity, or as specifically covered by this health benefit plan.  Sent appeal for OSA along with sleep study results.  Awaiting determination.

## 2023-12-26 DIAGNOSIS — E669 Obesity, unspecified: Secondary | ICD-10-CM | POA: Diagnosis not present

## 2023-12-26 DIAGNOSIS — G4733 Obstructive sleep apnea (adult) (pediatric): Secondary | ICD-10-CM | POA: Diagnosis not present

## 2023-12-26 DIAGNOSIS — Z1330 Encounter for screening examination for mental health and behavioral disorders, unspecified: Secondary | ICD-10-CM | POA: Diagnosis not present

## 2023-12-26 DIAGNOSIS — I10 Essential (primary) hypertension: Secondary | ICD-10-CM | POA: Diagnosis not present

## 2023-12-26 DIAGNOSIS — R002 Palpitations: Secondary | ICD-10-CM | POA: Diagnosis not present

## 2024-01-05 ENCOUNTER — Ambulatory Visit (INDEPENDENT_AMBULATORY_CARE_PROVIDER_SITE_OTHER): Admitting: Internal Medicine

## 2024-01-16 ENCOUNTER — Ambulatory Visit: Payer: Self-pay | Admitting: Primary Care

## 2024-01-25 ENCOUNTER — Encounter (INDEPENDENT_AMBULATORY_CARE_PROVIDER_SITE_OTHER): Payer: Self-pay | Admitting: Internal Medicine

## 2024-01-26 ENCOUNTER — Telehealth (INDEPENDENT_AMBULATORY_CARE_PROVIDER_SITE_OTHER): Payer: Self-pay | Admitting: Internal Medicine

## 2024-01-26 ENCOUNTER — Other Ambulatory Visit (HOSPITAL_COMMUNITY): Payer: Self-pay

## 2024-01-26 NOTE — Telephone Encounter (Signed)
 Pt wants a call back from Dr. Dow regarding the instruction Dr. Francyne wanted her to relay to her pharmacy concerning the Zepbound  refill. Please call patient at the number on file.

## 2024-01-26 NOTE — Telephone Encounter (Signed)
 Returned call, no answer, left message with the times to call me back

## 2024-01-31 ENCOUNTER — Encounter (INDEPENDENT_AMBULATORY_CARE_PROVIDER_SITE_OTHER): Payer: Self-pay

## 2024-01-31 NOTE — Telephone Encounter (Signed)
 Calling back asking about the specific instructions. She said a Clinical cytogeneticist message is fine.

## 2024-01-31 NOTE — Telephone Encounter (Signed)
 Patient said you had given her specific instructions as to what to say to the pharmacist when picking up her Zepbound . Let me know, if anything, and I will let her know.

## 2024-02-03 ENCOUNTER — Other Ambulatory Visit (HOSPITAL_COMMUNITY): Payer: Self-pay

## 2024-02-09 ENCOUNTER — Encounter (INDEPENDENT_AMBULATORY_CARE_PROVIDER_SITE_OTHER): Payer: Self-pay | Admitting: Internal Medicine

## 2024-02-09 ENCOUNTER — Ambulatory Visit (INDEPENDENT_AMBULATORY_CARE_PROVIDER_SITE_OTHER): Admitting: Internal Medicine

## 2024-02-09 VITALS — BP 110/72 | HR 84 | Temp 98.4°F | Ht 64.0 in | Wt 236.0 lb

## 2024-02-09 DIAGNOSIS — I10 Essential (primary) hypertension: Secondary | ICD-10-CM | POA: Diagnosis not present

## 2024-02-09 DIAGNOSIS — G4733 Obstructive sleep apnea (adult) (pediatric): Secondary | ICD-10-CM

## 2024-02-09 DIAGNOSIS — R638 Other symptoms and signs concerning food and fluid intake: Secondary | ICD-10-CM | POA: Diagnosis not present

## 2024-02-09 DIAGNOSIS — E66813 Obesity, class 3: Secondary | ICD-10-CM

## 2024-02-09 DIAGNOSIS — Z6841 Body Mass Index (BMI) 40.0 and over, adult: Secondary | ICD-10-CM

## 2024-02-09 DIAGNOSIS — R7303 Prediabetes: Secondary | ICD-10-CM | POA: Diagnosis not present

## 2024-02-09 NOTE — Assessment & Plan Note (Signed)
-   Sleep study 06/18/20 showed moderate OSA, AHI 21.4/hr with SpO2 low 87%. Started on CPAP in January 2023.  She reports good compliance with CPAP treatment.  She has lost approximately 60 pounds with structured nutritional plan inclusive of GLP-1.  Insurance denied coverage for GLP-1

## 2024-02-09 NOTE — Assessment & Plan Note (Signed)
 Blood pressure is well-controlled off labetalol.  Blood pressure control has improved with her weight loss.  Tirzepatide  also helps with blood pressure control continue current regimen

## 2024-02-09 NOTE — Assessment & Plan Note (Signed)
 Most recent A1c is  Lab Results  Component Value Date   HGBA1C 5.4 07/08/2022   HGBA1C 5.8 (H) 02/23/2012   And improved.  Continue current weight management strategy inclusive of GLP-1

## 2024-02-09 NOTE — Assessment & Plan Note (Signed)
 Weight: decrease of 61 lb (20.5%) over 1 year, 4 months  Start: 09/23/2022 297 lb (134.7 kg)  End: 02/09/2024 236 lb (107 kg)   Current weight is 236 lbs, down from 241 lbs last week. Weight fluctuations are attributed to changes in medication dosage. She is currently on a 10 mg dosage of GLP-1 receptor agonist. Concerns include metabolic adaptations such as adaptive thermogenesis, which may involve increased appetite, decreased satiety, and slowed metabolism. There is a need to assess metabolic rate and consider surgical options if medication costs become prohibitive. She prefers to avoid surgery and continue with current management if financially feasible. - Continue current 10 mg dosage of GLP-1 receptor agonist. - Schedule metabolic rate test in approximately three weeks, ensuring fasting state and no exercise or coffee prior to test. - Evaluate the need to increase medication dosage based on weight loss and appetite control after three weeks. - Consider adding metformin  to enhance the effect of the GLP-1 receptor agonist. - Research surgical options for weight management, including minimally invasive procedures. - Advise on stress management and adequate sleep as part of overall health management.

## 2024-02-09 NOTE — Progress Notes (Signed)
 Office: [phone]  /  Fax: [phone]  Weight Summary and Body Composition Analysis (BIA)  Vitals Temp: 98.4 F (36.9 C) BP: 110/72 Pulse Rate: 84 SpO2: 99 %   Anthropometric Measurements Height: 5' 4 (1.626 m) Weight: 236 lb (107 kg) BMI (Calculated): 40.49 Weight at Last Visit: 237 lb Weight Lost Since Last Visit: 1 lb Weight Gained Since Last Visit: 0 lb Starting Weight: 274 lb Peak Weight: 312 lb   Body Composition  Body Fat %: 46.8 % Fat Mass (lbs): 110.4 lbs Muscle Mass (lbs): 119.2 lbs Total Body Water (lbs): 86.4 lbs Visceral Fat Rating : 13    No data recorded Today's Visit #: 23  Starting Date: 02/04/22   Subjective   Chief Complaint: Obesity  Interval History Discussed the use of AI scribe software for clinical note transcription with the patient, who gave verbal consent to proceed.  History of Present Illness Cassie Duncan is a 38 year old female with hypertension and sleep apnea who presents for medical weight management.  Since last office visit she has lost 1 pound she reports following a low-carb plan 85 to 90% of the time.  She has been tracking calories eating more whole foods getting the recommended amount of protein occasionally may skip a meal she reports an adequate sleep and is exercising 7 days a week 65 minutes with a trainer doing cardio and strengthening.  She has experienced fluctuations in weight, noting that her weight has been up and down due to periods of being off her medication and then resuming it at a low dosage. She recently resumed a 10 mg dosage of her medication on Saturday. Her weight was 241 pounds last week and is currently 236 pounds. Last year, she made significant progress, but this year her weight reached 250 pounds at one point. She continues to meet with her trainer weekly.  She has a history of hypertension and moderate sleep apnea with an AHI of 21. 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 She expresses concerns about the cost of her medication and its impact on her business finances. 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     Challenges affecting patient progress: moderate to high levels of stress and having difficulties with GLP-1 or AOM coverage.    Pharmacotherapy for weight management: She is currently taking Zepbound  with adequate clinical response , without side effects., and currently at the 10 mg dose.  Insurance denied coverage for FDA approved conditions paying for medication out-of-pocket..   Assessment and Plan   Treatment Plan For Obesity:  Recommended Dietary Goals  Cassie Duncan is currently in the action stage of change. As such, her goal is to continue weight management plan. She has agreed to: continue current plan  Behavioral Health and Counseling  We discussed the following behavioral modification strategies today: continue to work on maintaining a reduced calorie state, getting the recommended amount of protein, incorporating whole foods, making healthy choices, staying well hydrated and practicing mindfulness when eating. and increase protein intake, fibrous foods (25 grams per day for women, 30 grams for men) and water to improve satiety and decrease hunger signals. .  Additional education and resources provided today: None  Recommended Physical Activity Goals  Cassie Duncan has been advised to work up to 150 minutes of moderate intensity aerobic activity a  week and strengthening exercises 2-3 times per week for cardiovascular health, weight loss maintenance and preservation of muscle mass.  She has agreed to :  Continue current level of physical activity   Medical Interventions and Pharmacotherapy  We discussed various medication options to help Cassie Duncan with her weight loss efforts and we both  agreed to : Adequate clinical response to anti-obesity medication, continue current regimen and she would like to stay at this dose to avoid having a harder time coming off medication.  I will like to see how she does over the next 4 weeks at the 10 mg dose if there is signs of weight loss slowing we may consider increasing medication  Associated Conditions Impacted by Obesity Treatment  Assessment & Plan OSA (obstructive sleep apnea) - Sleep study 06/18/20 showed moderate OSA, AHI 21.4/hr with SpO2 low 87%. Started on CPAP in January 2023.  She reports good compliance with CPAP treatment.  She has lost approximately 60 pounds with structured nutritional plan inclusive of GLP-1.  Insurance denied coverage for GLP-1 Prediabetes Most recent A1c is  Lab Results  Component Value Date   HGBA1C 5.4 07/08/2022   HGBA1C 5.8 (H) 02/23/2012   And improved.  Continue current weight management strategy inclusive of GLP-1  Abnormal food appetite Class 3 severe obesity with serious comorbidity and body mass index (BMI) of 40.0 to 44.9 in adult, unspecified obesity type (HCC) Weight: decrease of 61 lb (20.5%) over 1 year, 4 months  Start: 09/23/2022 297 lb (134.7 kg)  End: 02/09/2024 236 lb (107 kg)   Current weight is 236 lbs, down from 241 lbs last week. Weight fluctuations are attributed to changes in medication dosage. She is currently on a 10 mg dosage of GLP-1 receptor agonist. Concerns include metabolic adaptations such as adaptive thermogenesis, which may involve increased appetite, decreased satiety, and slowed metabolism. There is a need to assess metabolic rate and consider surgical options if medication costs become prohibitive. She prefers to avoid surgery and continue with current management if financially feasible. - Continue current 10 mg dosage of GLP-1 receptor agonist. - Schedule metabolic rate test in approximately three weeks, ensuring fasting state and no exercise or coffee prior to  test. - Evaluate the need to increase medication dosage based on weight loss and appetite control after three weeks. - Consider adding metformin  to enhance the effect of the GLP-1 receptor agonist. - Research surgical options for weight management, including minimally invasive procedures. - Advise on stress management and adequate sleep as part of overall health management. Essential hypertension Blood pressure is well-controlled off labetalol.  Blood pressure control has improved with her weight loss.  Tirzepatide  also helps with blood pressure control continue current regimen    Assessment and Plan      Objective   Physical Exam:  Blood pressure 110/72, pulse 84, temperature 98.4 F (36.9 C), height 5' 4 (1.626 m), weight 236 lb (107 kg), last menstrual period 01/11/2024, SpO2 99%. Body mass index is 40.51 kg/m.  General: She is overweight, cooperative, alert, well developed, and in no acute distress. PSYCH: Has normal mood, affect and thought process.   HEENT: EOMI, sclerae are anicteric. Lungs: Normal breathing effort, no conversational dyspnea. Extremities: No edema.  Neurologic: No gross sensory or motor deficits. No tremors or fasciculations noted.    Diagnostic Data Reviewed:  BMET    Component Value Date/Time   NA 135 08/16/2023 1205   NA 138 03/04/2022 1056   K 4.0 08/16/2023 1205  CL 105 08/16/2023 1205   CO2 24 08/16/2023 1205   GLUCOSE 73 08/16/2023 1205   BUN 16 08/16/2023 1205   BUN 9 03/04/2022 1056   CREATININE 0.62 08/16/2023 1205   CREATININE 0.79 09/05/2015 1201   CALCIUM 9.4 08/16/2023 1205   GFRNONAA >60 08/31/2021 1821   Lab Results  Component Value Date   HGBA1C 5.4 07/08/2022   HGBA1C 5.8 (H) 02/23/2012   Lab Results  Component Value Date   INSULIN  5.3 02/04/2022   Lab Results  Component Value Date   TSH 1.47 08/16/2023   CBC    Component Value Date/Time   WBC 4.9 08/16/2023 1205   RBC 4.37 08/16/2023 1205   HGB 12.8  08/16/2023 1205   HGB 11.7 03/04/2022 1056   HCT 39.2 08/16/2023 1205   HCT 35.0 03/04/2022 1056   PLT 308.0 08/16/2023 1205   MCV 89.5 08/16/2023 1205   MCV 87 03/04/2022 1056   MCH 29.0 03/04/2022 1056   MCH 29.4 08/31/2021 1821   MCHC 32.6 08/16/2023 1205   RDW 13.3 08/16/2023 1205   RDW 12.4 03/04/2022 1056   Iron Studies No results found for: IRON, TIBC, FERRITIN, IRONPCTSAT Lipid Panel     Component Value Date/Time   CHOL 144 08/16/2023 1205   CHOL 161 02/04/2022 0953   TRIG 39.0 08/16/2023 1205   HDL 40.20 08/16/2023 1205   HDL 47 02/04/2022 0953   CHOLHDL 4 08/16/2023 1205   VLDL 7.8 08/16/2023 1205   LDLCALC 96 08/16/2023 1205   LDLCALC 106 (H) 02/04/2022 0953   Hepatic Function Panel     Component Value Date/Time   PROT 7.4 08/16/2023 1205   PROT 7.3 03/04/2022 1056   ALBUMIN 4.0 08/16/2023 1205   ALBUMIN 3.9 03/04/2022 1056   AST 14 08/16/2023 1205   ALT 9 08/16/2023 1205   ALKPHOS 42 08/16/2023 1205   BILITOT 0.4 08/16/2023 1205   BILITOT 0.3 03/04/2022 1056      Component Value Date/Time   TSH 1.47 08/16/2023 1205   Nutritional Lab Results  Component Value Date   VD25OH 47.3 03/04/2022    Medications: Outpatient Encounter Medications as of 02/09/2024  Medication Sig   albuterol  (VENTOLIN  HFA) 108 (90 Base) MCG/ACT inhaler Inhale 2 puffs into the lungs every 6 (six) hours as needed for wheezing or shortness of breath.   Albuterol -Budesonide  (AIRSUPRA ) 90-80 MCG/ACT AERO Inhale 2 puffs into the lungs every 4 (four) hours as needed (Shortness of breath or chest tightness).   ascorbic acid (VITAMIN C) 250 MG tablet Take 1 tablet by mouth daily.   Cholecalciferol 10 MCG (400 UNIT) CAPS Take by mouth.   ELDERBERRY PO Take by mouth.   Multiple Vitamin (MULTIVITAMIN) capsule Take 1 capsule by mouth daily.   Omega 3-6-9 Fatty Acids (ADULT OMEGA PLUS DHA PO) Take by mouth.   OVER THE COUNTER MEDICATION CTS 360 DIETARY SUPPLEMENT .[id number] MG.. TAKES  TWICE A DAY. TONALIN - DIETARY SUPPLEMENT 2000 MG TWICE ADAY   tirzepatide  (ZEPBOUND ) 10 MG/0.5ML Pen Inject 10 mg into the skin once a week.   No facility-administered encounter medications on file as of 02/09/2024.     Follow-Up   Return in about 3 weeks (around 03/01/2024) for Fasting and 30 minutes early for IC.SABRA She was informed of the importance of frequent follow up visits to maximize her success with intensive lifestyle modifications for her multiple health conditions.  Attestation Statement   Reviewed by clinician on day of visit: allergies, medications, problem list,  medical history, surgical history, family history, social history, and previous encounter notes.     Lucas Parker, MD

## 2024-02-27 ENCOUNTER — Other Ambulatory Visit (HOSPITAL_COMMUNITY): Payer: Self-pay

## 2024-02-27 ENCOUNTER — Ambulatory Visit (INDEPENDENT_AMBULATORY_CARE_PROVIDER_SITE_OTHER): Admitting: Internal Medicine

## 2024-02-27 ENCOUNTER — Encounter (INDEPENDENT_AMBULATORY_CARE_PROVIDER_SITE_OTHER): Payer: Self-pay | Admitting: Internal Medicine

## 2024-02-27 VITALS — BP 138/84 | HR 75 | Temp 98.7°F | Ht 64.0 in | Wt 241.0 lb

## 2024-02-27 DIAGNOSIS — F5089 Other specified eating disorder: Secondary | ICD-10-CM

## 2024-02-27 DIAGNOSIS — G4733 Obstructive sleep apnea (adult) (pediatric): Secondary | ICD-10-CM | POA: Diagnosis not present

## 2024-02-27 DIAGNOSIS — F439 Reaction to severe stress, unspecified: Secondary | ICD-10-CM | POA: Diagnosis not present

## 2024-02-27 DIAGNOSIS — E66813 Obesity, class 3: Secondary | ICD-10-CM | POA: Diagnosis not present

## 2024-02-27 DIAGNOSIS — Z6841 Body Mass Index (BMI) 40.0 and over, adult: Secondary | ICD-10-CM

## 2024-02-27 MED ORDER — ZEPBOUND 12.5 MG/0.5ML ~~LOC~~ SOAJ
12.5000 mg | SUBCUTANEOUS | 0 refills | Status: DC
  Filled 2024-02-27: qty 2, 28d supply, fill #0

## 2024-02-27 NOTE — Assessment & Plan Note (Signed)
 Psychological Stress Related to Weight Management She reports feeling discouraged and stressed due to the perceived lack of progress in weight loss despite significant effort. It is important to focus on non-scale victories and maintain a positive mindset, as well as the challenges of the maintenance phase of weight management. She is encouraged to reflect on the quality of life improvements and not let the scale dictate her progress. - Encourage focusing on non-scale accomplishments and quality of life improvements. - Advise against all-or-nothing thinking and encourage self-compassion. - Allow for occasional indulgences to prevent feelings of deprivation.

## 2024-02-27 NOTE — Assessment & Plan Note (Signed)
 She is experiencing a slowdown in weight loss despite being on GLP-1 medication for approximately 75 weeks. Indirect calorimetry indicates her metabolic rate is 8372, close to the expected 1773, suggesting the slowdown is not due to metabolic changes. She has developed some tolerance to the medication, as indicated by increased hunger and portion sizes. Metabolic adaptations and protective mechanisms can slow weight loss as the body adjusts to a new weight. She is at a crossroads with the medication and weight loss, and adjustments are needed to continue progress. Increasing the GLP-1 medication dose is expected to help manage hunger as caloric intake is reduced.  - Reduce daily caloric intake to 1200 calories.  New meal plan provided - Increase Zepbound  from 10 mg to 12.5 mg; she has been on this medication for about 75 weeks - Use a meal replacement for one meal daily to ensure calorie and protein intake is consistent. - Create a 7-day, 1200-calorie, high-protein meal plan with no beef or pork, easy to prepare, and includes a meal replacement. - Incorporate a 200-calorie protein snack on workout days if feeling hungry. -Continue to reduce simple and added sugars in diet - Encourage self-monitoring and journaling of food intake for 3-4 days over the next 3 weeks to adjust to the new calorie target.

## 2024-02-27 NOTE — Assessment & Plan Note (Signed)
-   Sleep study 06/18/20 showed moderate OSA, AHI 21.4/hr with SpO2 low 87%. Started on CPAP in January 2023.  She reports good compliance with CPAP treatment.  She has lost approximately 60 pounds with structured nutritional plan inclusive of GLP-1.  Insurance denied coverage for GLP-1

## 2024-02-27 NOTE — Progress Notes (Signed)
 Office: [phone]  /  Fax: [phone]  Weight Summary and Body Composition Analysis (BIA)  Vitals Temp: 98.7 F (37.1 C) BP: 138/84 Pulse Rate: 75 SpO2: 100 %   Anthropometric Measurements Height: 5' 4 (1.626 m) Weight: 241 lb (109.3 kg) BMI (Calculated): 41.35 Weight at Last Visit: 236lb Weight Lost Since Last Visit: 0lb Weight Gained Since Last Visit: 5lb Starting Weight: 274lb Total Weight Loss (lbs): 32 lb (14.5 kg) Peak Weight: 312lb   Body Composition  Body Fat %: 48.9 % Fat Mass (lbs): 118.2 lbs Muscle Mass (lbs): 117.2 lbs Total Body Water (lbs): 90.4 lbs Visceral Fat Rating : 14    RMR: 1627  Today's Visit #: 24  Starting Date: 02/04/22   Subjective   Chief Complaint: Obesity  Interval History Discussed the use of AI scribe software for clinical note transcription with the patient, who gave verbal consent to proceed.  History of Present Illness Cassie Duncan is a 38 year old female who presents for medical weight management.  Since last office visit she has gained 5 pounds  She has experienced a plateau and weight regain despite being on GLP-1 medication for approximately 75 weeks. She is considering increasing the dose due to changes in hunger signals and portion sizes.  Indirect calorimetry today showed a metabolic rate of 8372 kcal/day, slightly lower than the expected 1773 kcal/day. Previously, her metabolic rate was around 1800 kcal/day, and she was targeting 1500 calories. Currently, she maintains her weight at 1500 calories.  Her hunger signals have changed, with portions occasionally increasing and a decrease in the feeling of restriction. 'Food noise' or brain hunger is not as prevalent as before but still occurs occasionally. She is considering increasing her medication dose to help manage these changes.  She exercises daily and is aware that physical activity temporarily increases metabolism. However, she acknowledges that  exercise alone is not sufficient for weight loss and emphasizes the importance of maintaining a calorie deficit. She is open to using meal replacements and prepackaged meals to help manage her calorie intake.  She has been feeling discouraged and stressed due to the perceived lack of progress despite her efforts. She has been strict with her diet, avoiding alcohol and carbohydrates, and feels she is reaching a breaking point due to the lack of perceived payoff.     Challenges affecting patient progress: strong hunger signals and/or impaired satiety / inhibitory control, inadequate response to pharmacotherapy, and metabolic adaptations associated with weight loss.    Pharmacotherapy for weight management: She is currently taking Zepbound  with waning clinical response and without side effects..   Assessment and Plan   Treatment Plan For Obesity:  Recommended Dietary Goals  Cassie Duncan is currently in the action stage of change. As such, her goal is to continue weight management plan. She has agreed to: continue current plan  Behavioral Health and Counseling  We discussed the following behavioral modification strategies today: work on tracking and journaling calories using tracking application, practice mindfulness eating and understand the difference between hunger signals and cravings, work on managing stress, creating time for self-care and relaxation, avoiding temptations and identifying enticing environmental cues, continue to work on implementation of reduced calorie nutritional plan, and avoid all or none thinking.  Additional education and resources provided today: None  Recommended Physical Activity Goals  Cassie Duncan has been advised to work up to 150 minutes of moderate intensity aerobic activity a week and strengthening exercises 2-3 times per week for cardiovascular health, weight loss maintenance and  preservation of muscle mass.  She has agreed to :  Increase volume of physical  activity to a goal of 240 minutes a week and Combine aerobic and strengthening exercises for efficiency and improved cardiometabolic health.  Medical Interventions and Pharmacotherapy  We discussed various medication options to help Cassie Duncan with her weight loss efforts and we both agreed to : Increase Zepbound  to 12.5 mg once a week patient is paying for medication out-of-pocket  Associated Conditions Impacted by Obesity Treatment  Assessment & Plan Class 3 severe obesity due to excess calories without serious comorbidity with body mass index (BMI) of 40.0 to 44.9 in adult [facility]) She is experiencing a slowdown in weight loss despite being on GLP-1 medication for approximately 75 weeks. Indirect calorimetry indicates her metabolic rate is 8372, close to the expected 1773, suggesting the slowdown is not due to metabolic changes. She has developed some tolerance to the medication, as indicated by increased hunger and portion sizes. Metabolic adaptations and protective mechanisms can slow weight loss as the body adjusts to a new weight. She is at a crossroads with the medication and weight loss, and adjustments are needed to continue progress. Increasing the GLP-1 medication dose is expected to help manage hunger as caloric intake is reduced.  - Reduce daily caloric intake to 1200 calories.  New meal plan provided - Increase Zepbound  from 10 mg to 12.5 mg; she has been on this medication for about 75 weeks - Use a meal replacement for one meal daily to ensure calorie and protein intake is consistent. - Create a 7-day, 1200-calorie, high-protein meal plan with no beef or pork, easy to prepare, and includes a meal replacement. - Incorporate a 200-calorie protein snack on workout days if feeling hungry. -Continue to reduce simple and added sugars in diet - Encourage self-monitoring and journaling of food intake for 3-4 days over the next 3 weeks to adjust to the new calorie target. Stress Other Specified  Feeding or Eating Disorder, Emotional Eating Behaviors Psychological Stress Related to Weight Management She reports feeling discouraged and stressed due to the perceived lack of progress in weight loss despite significant effort. It is important to focus on non-scale victories and maintain a positive mindset, as well as the challenges of the maintenance phase of weight management. She is encouraged to reflect on the quality of life improvements and not let the scale dictate her progress. - Encourage focusing on non-scale accomplishments and quality of life improvements. - Advise against all-or-nothing thinking and encourage self-compassion. - Allow for occasional indulgences to prevent feelings of deprivation. OSA (obstructive sleep apnea) - Sleep study 06/18/20 showed moderate OSA, AHI 21.4/hr with SpO2 low 87%. Started on CPAP in January 2023.  She reports good compliance with CPAP treatment.  She has lost approximately 60 pounds with structured nutritional plan inclusive of GLP-1.  Insurance denied coverage for GLP-1         Objective   Physical Exam:  Blood pressure 138/84, pulse 75, temperature 98.7 F (37.1 C), height 5' 4 (1.626 m), weight 241 lb (109.3 kg), last menstrual period 01/11/2024, SpO2 100%. Body mass index is 41.37 kg/m.  General: She is overweight, cooperative, alert, well developed, and in no acute distress. PSYCH: Has normal mood, affect and thought process.   HEENT: EOMI, sclerae are anicteric. Lungs: Normal breathing effort, no conversational dyspnea. Extremities: No edema.  Neurologic: No gross sensory or motor deficits. No tremors or fasciculations noted.    Diagnostic Data Reviewed:  BMET  Component Value Date/Time   NA 135 08/16/2023 1205   NA 138 03/04/2022 1056   K 4.0 08/16/2023 1205   CL 105 08/16/2023 1205   CO2 24 08/16/2023 1205   GLUCOSE 73 08/16/2023 1205   BUN 16 08/16/2023 1205   BUN 9 03/04/2022 1056   CREATININE 0.62 08/16/2023  1205   CREATININE 0.79 09/05/2015 1201   CALCIUM 9.4 08/16/2023 1205   GFRNONAA >60 08/31/2021 1821   Lab Results  Component Value Date   HGBA1C 5.4 07/08/2022   HGBA1C 5.8 (H) 02/23/2012   Lab Results  Component Value Date   INSULIN  5.3 02/04/2022   Lab Results  Component Value Date   TSH 1.47 08/16/2023   CBC    Component Value Date/Time   WBC 4.9 08/16/2023 1205   RBC 4.37 08/16/2023 1205   HGB 12.8 08/16/2023 1205   HGB 11.7 03/04/2022 1056   HCT 39.2 08/16/2023 1205   HCT 35.0 03/04/2022 1056   PLT 308.0 08/16/2023 1205   MCV 89.5 08/16/2023 1205   MCV 87 03/04/2022 1056   MCH 29.0 03/04/2022 1056   MCH 29.4 08/31/2021 1821   MCHC 32.6 08/16/2023 1205   RDW 13.3 08/16/2023 1205   RDW 12.4 03/04/2022 1056   Iron Studies No results found for: IRON, TIBC, FERRITIN, IRONPCTSAT Lipid Panel     Component Value Date/Time   CHOL 144 08/16/2023 1205   CHOL 161 02/04/2022 0953   TRIG 39.0 08/16/2023 1205   HDL 40.20 08/16/2023 1205   HDL 47 02/04/2022 0953   CHOLHDL 4 08/16/2023 1205   VLDL 7.8 08/16/2023 1205   LDLCALC 96 08/16/2023 1205   LDLCALC 106 (H) 02/04/2022 0953   Hepatic Function Panel     Component Value Date/Time   PROT 7.4 08/16/2023 1205   PROT 7.3 03/04/2022 1056   ALBUMIN 4.0 08/16/2023 1205   ALBUMIN 3.9 03/04/2022 1056   AST 14 08/16/2023 1205   ALT 9 08/16/2023 1205   ALKPHOS 42 08/16/2023 1205   BILITOT 0.4 08/16/2023 1205   BILITOT 0.3 03/04/2022 1056      Component Value Date/Time   TSH 1.47 08/16/2023 1205   Nutritional Lab Results  Component Value Date   VD25OH 47.3 03/04/2022    Medications: Outpatient Encounter Medications as of 02/27/2024  Medication Sig   albuterol  (VENTOLIN  HFA) 108 (90 Base) MCG/ACT inhaler Inhale 2 puffs into the lungs every 6 (six) hours as needed for wheezing or shortness of breath.   Albuterol -Budesonide  (AIRSUPRA ) 90-80 MCG/ACT AERO Inhale 2 puffs into the lungs every 4 (four) hours  as needed (Shortness of breath or chest tightness).   ascorbic acid (VITAMIN C) 250 MG tablet Take 1 tablet by mouth daily.   Cholecalciferol 10 MCG (400 UNIT) CAPS Take by mouth.   ELDERBERRY PO Take by mouth.   Multiple Vitamin (MULTIVITAMIN) capsule Take 1 capsule by mouth daily.   Omega 3-6-9 Fatty Acids (ADULT OMEGA PLUS DHA PO) Take by mouth.   OVER THE COUNTER MEDICATION CTS 360 DIETARY SUPPLEMENT .SABRA429 MG.. TAKES TWICE A DAY. TONALIN - DIETARY SUPPLEMENT 2000 MG TWICE ADAY   tirzepatide  (ZEPBOUND ) 12.5 MG/0.5ML Pen Inject 12.5 mg into the skin once a week.   [DISCONTINUED] tirzepatide  (ZEPBOUND ) 10 MG/0.5ML Pen Inject 10 mg into the skin once a week.   No facility-administered encounter medications on file as of 02/27/2024.     Follow-Up   Return in about 4 weeks (around 03/26/2024) for For Weight Mangement with Dr. Francyne.SABRA She  was informed of the importance of frequent follow up visits to maximize her success with intensive lifestyle modifications for her multiple health conditions.  Attestation Statement   Reviewed by clinician on day of visit: allergies, medications, problem list, medical history, surgical history, family history, social history, and previous encounter notes.     Lucas Parker, MD

## 2024-03-08 ENCOUNTER — Other Ambulatory Visit (HOSPITAL_COMMUNITY): Payer: Self-pay

## 2024-04-05 ENCOUNTER — Encounter (INDEPENDENT_AMBULATORY_CARE_PROVIDER_SITE_OTHER): Payer: Self-pay | Admitting: Internal Medicine

## 2024-04-05 ENCOUNTER — Ambulatory Visit (INDEPENDENT_AMBULATORY_CARE_PROVIDER_SITE_OTHER): Payer: Self-pay | Admitting: Internal Medicine

## 2024-04-05 ENCOUNTER — Encounter (INDEPENDENT_AMBULATORY_CARE_PROVIDER_SITE_OTHER): Payer: Self-pay

## 2024-04-05 VITALS — BP 130/87 | HR 76 | Temp 98.4°F | Ht 64.0 in | Wt 250.0 lb

## 2024-04-05 DIAGNOSIS — G4733 Obstructive sleep apnea (adult) (pediatric): Secondary | ICD-10-CM | POA: Diagnosis not present

## 2024-04-05 DIAGNOSIS — R7303 Prediabetes: Secondary | ICD-10-CM | POA: Diagnosis not present

## 2024-04-05 DIAGNOSIS — Z6841 Body Mass Index (BMI) 40.0 and over, adult: Secondary | ICD-10-CM | POA: Diagnosis not present

## 2024-04-05 DIAGNOSIS — E66813 Obesity, class 3: Secondary | ICD-10-CM

## 2024-04-05 MED ORDER — ZEPBOUND 12.5 MG/0.5ML ~~LOC~~ SOLN: 1.0000 | SUBCUTANEOUS | 1 refills | Status: DC

## 2024-04-05 NOTE — Assessment & Plan Note (Signed)
-   Sleep study 06/18/20 showed moderate OSA, AHI 21.4/hr with SpO2 low 87%. Started on CPAP in January 2023.  She reports good compliance with CPAP treatment.  She has lost approximately 60 pounds with structured nutritional plan inclusive of GLP-1.  Insurance denied coverage for GLP-1 and she is paying for medication out-of-pocket.

## 2024-04-05 NOTE — Progress Notes (Signed)
 Office: [phone]  /  Fax: [phone]  Weight Summary and Body Composition Analysis (BIA)  Vitals Temp: 98.4 F (36.9 C) BP: 130/87 Pulse Rate: 76 SpO2: 99 %   Anthropometric Measurements Height: 5' 4 (1.626 m) Weight: 250 lb (113.4 kg) BMI (Calculated): 42.89 Weight at Last Visit: 241 lb Weight Lost Since Last Visit: 0 lb Weight Gained Since Last Visit: 9 lb Starting Weight: 274 lb Total Weight Loss (lbs): 24 lb (10.9 kg) Peak Weight: 312 lb   Body Composition  Body Fat %: 53.3 % Fat Mass (lbs): 133.6 lbs Muscle Mass (lbs): 111 lbs Visceral Fat Rating : 15    RMR: 1627  Today's Visit #: 25  Starting Date: 02/04/22   Subjective   Chief Complaint: Obesity  Interval History Discussed the use of AI scribe software for clinical note transcription with the patient, who gave verbal consent to proceed.  History of Present Illness Cassie Duncan is a 38 year old female who presents for medication management and weight management follow-up.  Since last office visit she has gained 9 pounds she is doing a low-carb reduced calorie nutrition plan with fair adherence.   She has been off her medication for approximately two weeks due to pharmacy hours conflicting with her work schedule. She was previously on a 10 mg dose of Zepbound  and was supposed to increase to 12.5 mg. She inquires whether she should resume at the higher dose after the break. Mild nausea occurs when restarting the medication after a previous break, but no vomiting.   She recently returned from a vacation in [place], where she visited 61. She felt energetic, was able to walk extensively, and enjoyed roller coasters without needing an extender at the airport. She notes this as a significant non-scale victory, as she previously had concerns about fitting on rides.  She has a history of moderate sleep apnea and is in the process of selecting new insurance..  She mentions a recent weight regain.  She previously lost 61 pounds, going from 297 to 236 pounds, which she considers a significant achievement. She is focused on maintaining her weight loss and getting back on track with her medication.     Challenges affecting patient progress: none.    Pharmacotherapy for weight management: She is currently taking Zepbound  with adequate clinical response  and without side effects..   Assessment and Plan   Treatment Plan For Obesity:  Recommended Dietary Goals  Kimra is currently in the action stage of change. As such, her goal is to continue weight management plan. She has agreed to: continue current plan  Behavioral Health and Counseling  We discussed the following behavioral modification strategies today: continue to work on maintaining a reduced calorie state, getting the recommended amount of protein, incorporating whole foods, making healthy choices, staying well hydrated and practicing mindfulness when eating. and increase protein intake, fibrous foods (25 grams per day for women, 30 grams for men) and water to improve satiety and decrease hunger signals. .  Additional education and resources provided today: None  Recommended Physical Activity Goals  Christa has been advised to work up to 150 minutes of moderate intensity aerobic activity a week and strengthening exercises 2-3 times per week for cardiovascular health, weight loss maintenance and preservation of muscle mass.  She has agreed to :  Think about enjoyable ways to increase daily physical activity and overcoming barriers to exercise, Increase physical activity in their day and reduce sedentary time (increase NEAT)., Increase volume of physical  activity to a goal of 240 minutes a week, and Combine aerobic and strengthening exercises for efficiency and improved cardiometabolic health.  Medical Interventions and Pharmacotherapy  We discussed various medication options to help Doyce with her weight loss efforts and we  both agreed to : Patient will be switched to the vial form for cost savings.  She will start Zepbound  12.5 mg once a week  Associated Conditions Impacted by Obesity Treatment  Assessment & Plan Class 3 severe obesity due to excess calories without serious comorbidity with body mass index (BMI) of 40.0 to 44.9 in adult (HCC) Weight: decrease of 47 lb (15.8%) over 1 year, 6 months  Start: 09/23/2022 297 lb (134.7 kg)  End: 04/05/2024 250 lb (113.4 kg)  She has experienced about 11 pounds of weight regain due to vacationing and interruption.  She is working on getting back on track. I would like for her to target 1200 cal/day she is due for an adjustment due to her weight loss her basal metabolic rate is around 1720. Continue increasing intensity workouts along with trainer Zepbound  will be increased to 12.5 mg once a week and switch to the bio form for cost savings Prediabetes Most recent A1c is  Lab Results  Component Value Date   HGBA1C 5.4 07/08/2022   HGBA1C 5.8 (H) 02/23/2012   And improved.  Continue current weight management strategy inclusive of GLP-1  OSA (obstructive sleep apnea) - Sleep study 06/18/20 showed moderate OSA, AHI 21.4/hr with SpO2 low 87%. Started on CPAP in January 2023.  She reports good compliance with CPAP treatment.  She has lost approximately 60 pounds with structured nutritional plan inclusive of GLP-1.  Insurance denied coverage for GLP-1 and she is paying for medication out-of-pocket.         Objective   Physical Exam:  Blood pressure 130/87, pulse 76, temperature 98.4 F (36.9 C), height 5' 4 (1.626 m), weight 250 lb (113.4 kg), last menstrual period 04/05/2024, SpO2 99%. Body mass index is 42.91 kg/m.  General: She is overweight, cooperative, alert, well developed, and in no acute distress. PSYCH: Has normal mood, affect and thought process.   HEENT: EOMI, sclerae are anicteric. Lungs: Normal breathing effort, no conversational  dyspnea. Extremities: No edema.  Neurologic: No gross sensory or motor deficits. No tremors or fasciculations noted.    Diagnostic Data Reviewed:  BMET    Component Value Date/Time   NA 135 08/16/2023 1205   NA 138 03/04/2022 1056   K 4.0 08/16/2023 1205   CL 105 08/16/2023 1205   CO2 24 08/16/2023 1205   GLUCOSE 73 08/16/2023 1205   BUN 16 08/16/2023 1205   BUN 9 03/04/2022 1056   CREATININE 0.62 08/16/2023 1205   CREATININE 0.79 09/05/2015 1201   CALCIUM 9.4 08/16/2023 1205   GFRNONAA >60 08/31/2021 1821   Lab Results  Component Value Date   HGBA1C 5.4 07/08/2022   HGBA1C 5.8 (H) 02/23/2012   Lab Results  Component Value Date   INSULIN  5.3 02/04/2022   Lab Results  Component Value Date   TSH 1.47 08/16/2023   CBC    Component Value Date/Time   WBC 4.9 08/16/2023 1205   RBC 4.37 08/16/2023 1205   HGB 12.8 08/16/2023 1205   HGB 11.7 03/04/2022 1056   HCT 39.2 08/16/2023 1205   HCT 35.0 03/04/2022 1056   PLT 308.0 08/16/2023 1205   MCV 89.5 08/16/2023 1205   MCV 87 03/04/2022 1056   MCH 29.0 03/04/2022 1056  MCH 29.4 08/31/2021 1821   MCHC 32.6 08/16/2023 1205   RDW 13.3 08/16/2023 1205   RDW 12.4 03/04/2022 1056   Iron Studies No results found for: IRON, TIBC, FERRITIN, IRONPCTSAT Lipid Panel     Component Value Date/Time   CHOL 144 08/16/2023 1205   CHOL 161 02/04/2022 0953   TRIG 39.0 08/16/2023 1205   HDL 40.20 08/16/2023 1205   HDL 47 02/04/2022 0953   CHOLHDL 4 08/16/2023 1205   VLDL 7.8 08/16/2023 1205   LDLCALC 96 08/16/2023 1205   LDLCALC 106 (H) 02/04/2022 0953   Hepatic Function Panel     Component Value Date/Time   PROT 7.4 08/16/2023 1205   PROT 7.3 03/04/2022 1056   ALBUMIN 4.0 08/16/2023 1205   ALBUMIN 3.9 03/04/2022 1056   AST 14 08/16/2023 1205   ALT 9 08/16/2023 1205   ALKPHOS 42 08/16/2023 1205   BILITOT 0.4 08/16/2023 1205   BILITOT 0.3 03/04/2022 1056      Component Value Date/Time   TSH 1.47 08/16/2023  1205   Nutritional Lab Results  Component Value Date   VD25OH 47.3 03/04/2022    Medications: Outpatient Encounter Medications as of 04/05/2024  Medication Sig   albuterol  (VENTOLIN  HFA) 108 (90 Base) MCG/ACT inhaler Inhale 2 puffs into the lungs every 6 (six) hours as needed for wheezing or shortness of breath.   Albuterol -Budesonide  (AIRSUPRA ) 90-80 MCG/ACT AERO Inhale 2 puffs into the lungs every 4 (four) hours as needed (Shortness of breath or chest tightness).   ascorbic acid (VITAMIN C) 250 MG tablet Take 1 tablet by mouth daily.   Cholecalciferol 10 MCG (400 UNIT) CAPS Take by mouth.   ELDERBERRY PO Take by mouth.   Multiple Vitamin (MULTIVITAMIN) capsule Take 1 capsule by mouth daily.   Omega 3-6-9 Fatty Acids (ADULT OMEGA PLUS DHA PO) Take by mouth.   OVER THE COUNTER MEDICATION CTS 360 DIETARY SUPPLEMENT .SABRA429 MG.. TAKES TWICE A DAY. TONALIN - DIETARY SUPPLEMENT 2000 MG TWICE ADAY   Tirzepatide -Weight Management (ZEPBOUND ) 12.5 MG/0.5ML SOLN Inject 1 Dose into the skin once a week.   [DISCONTINUED] tirzepatide  (ZEPBOUND ) 12.5 MG/0.5ML Pen Inject 12.5 mg into the skin once a week.   No facility-administered encounter medications on file as of 04/05/2024.     Follow-Up   Return in about 4 weeks (around 05/03/2024) for For Weight Mangement with Dr. Francyne.SABRA She was informed of the importance of frequent follow up visits to maximize her success with intensive lifestyle modifications for her multiple health conditions.  Attestation Statement   Reviewed by clinician on day of visit: allergies, medications, problem list, medical history, surgical history, family history, social history, and previous encounter notes.     Lucas Francyne, MD

## 2024-04-05 NOTE — Assessment & Plan Note (Signed)
 Most recent A1c is  Lab Results  Component Value Date   HGBA1C 5.4 07/08/2022   HGBA1C 5.8 (H) 02/23/2012   And improved.  Continue current weight management strategy inclusive of GLP-1

## 2024-04-05 NOTE — Assessment & Plan Note (Signed)
 Weight: decrease of 47 lb (15.8%) over 1 year, 6 months  Start: 09/23/2022 297 lb (134.7 kg)  End: 04/05/2024 250 lb (113.4 kg)  She has experienced about 11 pounds of weight regain due to vacationing and interruption.  She is working on getting back on track. I would like for her to target 1200 cal/day she is due for an adjustment due to her weight loss her basal metabolic rate is around 1720. Continue increasing intensity workouts along with trainer Zepbound  will be increased to 12.5 mg once a week and switch to the bio form for cost savings

## 2024-04-11 ENCOUNTER — Encounter (INDEPENDENT_AMBULATORY_CARE_PROVIDER_SITE_OTHER): Payer: Self-pay | Admitting: Internal Medicine

## 2024-04-11 ENCOUNTER — Telehealth (INDEPENDENT_AMBULATORY_CARE_PROVIDER_SITE_OTHER): Payer: Self-pay | Admitting: Internal Medicine

## 2024-04-11 NOTE — Telephone Encounter (Signed)
 Pt called and she was seen last week and was to be receiving the zepbound  from the [employer] directly but they are requesting additional information which is delaying the process of getting the medication. Which means she will be off for a longer time. She is asking that you send the [facility] 10 [facility] and next month when you see her you can send it to [facility] for the 12.5 mg dose.  Pt is available until 1000am this morning and then again 115pm to 230pm this afternoon if you need to discuss.

## 2024-04-12 ENCOUNTER — Other Ambulatory Visit (INDEPENDENT_AMBULATORY_CARE_PROVIDER_SITE_OTHER): Payer: Self-pay

## 2024-04-12 ENCOUNTER — Other Ambulatory Visit (HOSPITAL_COMMUNITY): Payer: Self-pay

## 2024-04-12 MED ORDER — ZEPBOUND 10 MG/0.5ML ~~LOC~~ SOAJ
10.0000 mg | SUBCUTANEOUS | 0 refills | Status: DC
  Filled 2024-04-12: qty 2, 28d supply, fill #0

## 2024-04-17 ENCOUNTER — Other Ambulatory Visit (HOSPITAL_COMMUNITY): Payer: Self-pay

## 2024-04-19 ENCOUNTER — Other Ambulatory Visit (HOSPITAL_COMMUNITY): Payer: Self-pay

## 2024-04-30 ENCOUNTER — Other Ambulatory Visit (HOSPITAL_COMMUNITY): Payer: Self-pay

## 2024-05-10 ENCOUNTER — Encounter (INDEPENDENT_AMBULATORY_CARE_PROVIDER_SITE_OTHER): Payer: Self-pay | Admitting: Internal Medicine

## 2024-05-10 ENCOUNTER — Ambulatory Visit (INDEPENDENT_AMBULATORY_CARE_PROVIDER_SITE_OTHER): Payer: Self-pay | Admitting: Internal Medicine

## 2024-05-10 ENCOUNTER — Ambulatory Visit (INDEPENDENT_AMBULATORY_CARE_PROVIDER_SITE_OTHER): Admitting: Internal Medicine

## 2024-05-10 ENCOUNTER — Encounter (INDEPENDENT_AMBULATORY_CARE_PROVIDER_SITE_OTHER): Payer: Self-pay

## 2024-05-10 VITALS — BP 138/80 | HR 78 | Temp 98.4°F | Ht 64.0 in | Wt 244.0 lb

## 2024-05-10 DIAGNOSIS — R7303 Prediabetes: Secondary | ICD-10-CM

## 2024-05-10 DIAGNOSIS — E66813 Obesity, class 3: Secondary | ICD-10-CM | POA: Diagnosis not present

## 2024-05-10 DIAGNOSIS — I10 Essential (primary) hypertension: Secondary | ICD-10-CM

## 2024-05-10 DIAGNOSIS — G4733 Obstructive sleep apnea (adult) (pediatric): Secondary | ICD-10-CM | POA: Diagnosis not present

## 2024-05-10 DIAGNOSIS — Z6841 Body Mass Index (BMI) 40.0 and over, adult: Secondary | ICD-10-CM

## 2024-05-10 DIAGNOSIS — R638 Other symptoms and signs concerning food and fluid intake: Secondary | ICD-10-CM

## 2024-05-10 MED ORDER — TIRZEPATIDE-WEIGHT MANAGEMENT 12.5 MG/0.5ML ~~LOC~~ SOLN: 12.5000 mg | SUBCUTANEOUS | 0 refills | Status: AC

## 2024-05-10 NOTE — Assessment & Plan Note (Signed)
 Most recent A1c is  Lab Results  Component Value Date   HGBA1C 5.4 07/08/2022   HGBA1C 5.8 (H) 02/23/2012   Consider repeating A1c this year she had blood work done in April but ordering provider did not include hemoglobin A1c test.  Orders:   Tirzepatide -Weight Management 12.5 MG/0.5ML SOLN; Inject 12.5 mg into the skin once a week.

## 2024-05-10 NOTE — Assessment & Plan Note (Signed)
 Weight: decrease of 53 lb (17.8%) over 1 year, 7 months  Start: 09/23/2022 297 lb (134.7 kg)  End: 05/10/2024 244 lb (110.7 kg)   Orders:   Tirzepatide -Weight Management 12.5 MG/0.5ML SOLN; Inject 12.5 mg into the skin once a week.

## 2024-05-10 NOTE — Assessment & Plan Note (Signed)
-   Sleep study 06/18/20 showed moderate OSA, AHI 21.4/hr with SpO2 low 87%. Started on CPAP in January 2023.  She reports good compliance with CPAP treatment.  She has lost approximately 60 pounds with structured nutritional plan inclusive of GLP-1.  Insurance denied coverage for GLP-1 and she is paying for medication out-of-pocket. Orders:   Tirzepatide -Weight Management 12.5 MG/0.5ML SOLN; Inject 12.5 mg into the skin once a week.

## 2024-05-10 NOTE — Assessment & Plan Note (Signed)
 She has increased orexigenic signaling, impaired satiety and inhibitory control. 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  In addition to nutritional and behavioral strategies she benefits from pharmacotherapy.  We will decrease Zepbound  to 12.5 mg once a week

## 2024-05-10 NOTE — Progress Notes (Signed)
 "  Office: [phone]  /  Fax: [phone]  Weight Summary and Body Composition Analysis (BIA)  Vitals Temp: 98.4 F (36.9 C) BP: 138/80 Pulse Rate: 78 SpO2: 99 %   Anthropometric Measurements Height: 5' 4 (1.626 m) Weight: 244 lb (110.7 kg) BMI (Calculated): 41.86 Weight at Last Visit: 250 lb Weight Lost Since Last Visit: 6 lb Weight Gained Since Last Visit: 0 lb Starting Weight: 274 lb Total Weight Loss (lbs): 30 lb (13.6 kg) Peak Weight: 3124 lb   Body Composition  Body Fat %: 49.3 % Fat Mass (lbs): 120.2 lbs Muscle Mass (lbs): 117.6 lbs Total Body Water (lbs): 95.4 lbs Visceral Fat Rating : 14    RMR: 1627  Today's Visit #: 26  Starting Date: 02/04/22   Subjective   Chief Complaint: Obesity  Interval History  Discussed the use of AI scribe software for clinical note transcription with the patient, who gave verbal consent to proceed.  History of Present Illness Cassie Duncan is a 39 year old female with obesity, hypertension, and sleep apnea who presents for medical weight management.  She is following a low-carb reduced calorie nutrition plan.  She is exercising 3 to 7 days a week 60 minutes doing cardio and strengthening.  She has been on Zepbound  10 mg weekly for anti-obesity treatment, resulting in a weight loss of 61 pounds over the past year and four months. Recently, she experienced a weight regain of 8 pounds since October but has resumed a downward trend in weight loss.  During a recent trip to [place] for Christmas, she managed to lose 6 pounds by maintaining her workout routine and increasing her physical activity, including walking up to 16,000 steps in a day. She ate well without overindulging, often sharing food with her brother.  Since returning from her trip, she has been traveling between [place], [place], and [place] due to her brother's knee injury, which has led to more 'grab and go' meals. Despite this, she continues to  work out daily and has incorporated an elliptical machine at home to maintain her physical activity.  She is working with a psychologist, educational to increase her strength and is lifting heavier weights, although she feels she is not lifting enough.  She is not currently taking medication for hypertension and is mindful of her sodium intake. She is exploring prepackaged meal options to help manage her diet amidst her busy schedule.     Challenges affecting patient progress: moderate to high levels of stress.    Pharmacotherapy for weight management: She is currently taking Zepbound  with adequate clinical response  and without side effects..   Assessment and Plan   Treatment Plan For Obesity:  Recommended Dietary Goals  Cassie Duncan is currently in the action stage of change. As such, her goal is to continue weight management plan. She has agreed to: incorporate prepackaged healthy meals for convenience, incorporate 1-2 meal replacements a day for convenience , and continue current plan  Behavioral Health and Counseling  We discussed the following behavioral modification strategies today: increasing lean protein intake to established goals, decreasing simple carbohydrates , work on meal planning and preparation, and work on tracking and journaling calories using tracking application.  Additional education and resources provided today: None  Recommended Physical Activity Goals  Cassie Duncan has been advised to work up to 150 minutes of moderate intensity aerobic activity a week and strengthening exercises 2-3 times per week for cardiovascular health, weight loss maintenance and preservation of muscle mass.  She has agreed  to :  Discussed changing exercise routine to avoid adaptation plateau or training plateau  Medical Interventions and Pharmacotherapy  We discussed various medication options to help Cassie Duncan with her weight loss efforts and we both agreed to : Increase Zepbound  to 12.5 mg once a  week  Associated Conditions Impacted by Obesity Treatment  Assessment & Plan Essential hypertension Blood pressure is well-controlled off labetalol.  Blood pressure control has improved with her weight loss.  We are increasing tirzepatide  which would help lower blood pressure as well.  We need to monitor off medication Orders:   Tirzepatide -Weight Management 12.5 MG/0.5ML SOLN; Inject 12.5 mg into the skin once a week.   OSA (obstructive sleep apnea) - Sleep study 06/18/20 showed moderate OSA, AHI 21.4/hr with SpO2 low 87%. Started on CPAP in January 2023.  She reports good compliance with CPAP treatment.  She has lost approximately 60 pounds with structured nutritional plan inclusive of GLP-1.  Insurance denied coverage for GLP-1 and she is paying for medication out-of-pocket. Orders:   Tirzepatide -Weight Management 12.5 MG/0.5ML SOLN; Inject 12.5 mg into the skin once a week.   Prediabetes Most recent A1c is  Lab Results  Component Value Date   HGBA1C 5.4 07/08/2022   HGBA1C 5.8 (H) 02/23/2012   Consider repeating A1c this year she had blood work done in April but ordering provider did not include hemoglobin A1c test.  Orders:   Tirzepatide -Weight Management 12.5 MG/0.5ML SOLN; Inject 12.5 mg into the skin once a week.   Class 3 severe obesity with serious comorbidity and body mass index (BMI) of 40.0 to 44.9 in adult, unspecified obesity type (HCC) Weight: decrease of 53 lb (17.8%) over 1 year, 7 months  Start: 09/23/2022 297 lb (134.7 kg)  End: 05/10/2024 244 lb (110.7 kg)   Orders:   Tirzepatide -Weight Management 12.5 MG/0.5ML SOLN; Inject 12.5 mg into the skin once a week.  Abnormal food appetite She has increased orexigenic signaling, impaired satiety and inhibitory control. 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  In addition to nutritional and behavioral strategies she benefits from  pharmacotherapy.  We will decrease Zepbound  to 12.5 mg once a week            Objective   Physical Exam:  Blood pressure 138/80, pulse 78, temperature 98.4 F (36.9 C), height 5' 4 (1.626 m), weight 244 lb (110.7 kg), last menstrual period 05/02/2024, SpO2 99%. Body mass index is 41.88 kg/m.  General: She is overweight, cooperative, alert, well developed, and in no acute distress. PSYCH: Has normal mood, affect and thought process.   HEENT: EOMI, sclerae are anicteric. Lungs: Normal breathing effort, no conversational dyspnea. Extremities: No edema.  Neurologic: No gross sensory or motor deficits. No tremors or fasciculations noted.    Diagnostic Data Reviewed:  BMET    Component Value Date/Time   NA 135 08/16/2023 1205   NA 138 03/04/2022 1056   K 4.0 08/16/2023 1205   CL 105 08/16/2023 1205   CO2 24 08/16/2023 1205   GLUCOSE 73 08/16/2023 1205   BUN 16 08/16/2023 1205   BUN 9 03/04/2022 1056   CREATININE 0.62 08/16/2023 1205   CREATININE 0.79 09/05/2015 1201   CALCIUM 9.4 08/16/2023 1205   GFRNONAA >60 08/31/2021 1821   Lab Results  Component Value Date   HGBA1C 5.4 07/08/2022   HGBA1C 5.8 (H) 02/23/2012   Lab Results  Component Value Date   INSULIN  5.3 02/04/2022  Lab Results  Component Value Date   TSH 1.47 08/16/2023   CBC    Component Value Date/Time   WBC 4.9 08/16/2023 1205   RBC 4.37 08/16/2023 1205   HGB 12.8 08/16/2023 1205   HGB 11.7 03/04/2022 1056   HCT 39.2 08/16/2023 1205   HCT 35.0 03/04/2022 1056   PLT 308.0 08/16/2023 1205   MCV 89.5 08/16/2023 1205   MCV 87 03/04/2022 1056   MCH 29.0 03/04/2022 1056   MCH 29.4 08/31/2021 1821   MCHC 32.6 08/16/2023 1205   RDW 13.3 08/16/2023 1205   RDW 12.4 03/04/2022 1056   Iron Studies No results found for: IRON, TIBC, FERRITIN, IRONPCTSAT Lipid Panel     Component Value Date/Time   CHOL 144 08/16/2023 1205   CHOL 161 02/04/2022 0953   TRIG 39.0 08/16/2023 1205   HDL  40.20 08/16/2023 1205   HDL 47 02/04/2022 0953   CHOLHDL 4 08/16/2023 1205   VLDL 7.8 08/16/2023 1205   LDLCALC 96 08/16/2023 1205   LDLCALC 106 (H) 02/04/2022 0953   Hepatic Function Panel     Component Value Date/Time   PROT 7.4 08/16/2023 1205   PROT 7.3 03/04/2022 1056   ALBUMIN 4.0 08/16/2023 1205   ALBUMIN 3.9 03/04/2022 1056   AST 14 08/16/2023 1205   ALT 9 08/16/2023 1205   ALKPHOS 42 08/16/2023 1205   BILITOT 0.4 08/16/2023 1205   BILITOT 0.3 03/04/2022 1056      Component Value Date/Time   TSH 1.47 08/16/2023 1205   Nutritional Lab Results  Component Value Date   VD25OH 47.3 03/04/2022    Medications: Outpatient Encounter Medications as of 05/10/2024  Medication Sig   Tirzepatide -Weight Management 12.5 MG/0.5ML SOLN Inject 12.5 mg into the skin once a week.   albuterol  (VENTOLIN  HFA) 108 (90 Base) MCG/ACT inhaler Inhale 2 puffs into the lungs every 6 (six) hours as needed for wheezing or shortness of breath.   Albuterol -Budesonide  (AIRSUPRA ) 90-80 MCG/ACT AERO Inhale 2 puffs into the lungs every 4 (four) hours as needed (Shortness of breath or chest tightness).   ascorbic acid (VITAMIN C) 250 MG tablet Take 1 tablet by mouth daily.   Cholecalciferol 10 MCG (400 UNIT) CAPS Take by mouth.   ELDERBERRY PO Take by mouth.   Multiple Vitamin (MULTIVITAMIN) capsule Take 1 capsule by mouth daily.   Omega 3-6-9 Fatty Acids (ADULT OMEGA PLUS DHA PO) Take by mouth.   OVER THE COUNTER MEDICATION CTS 360 DIETARY SUPPLEMENT .[id number] MG.. TAKES TWICE A DAY. TONALIN - DIETARY SUPPLEMENT 2000 MG TWICE ADAY   [DISCONTINUED] tirzepatide  (ZEPBOUND ) 10 MG/0.5ML Pen Inject 10 mg into the skin once a week.   [DISCONTINUED] Tirzepatide -Weight Management (ZEPBOUND ) 12.5 MG/0.5ML SOLN Inject 1 Dose into the skin once a week.   No facility-administered encounter medications on file as of 05/10/2024.     Follow-Up   Return in about 4 weeks (around 06/07/2024) for For Weight Mangement with Dr.  Francyne.[email or web address] She was informed of the importance of frequent follow up visits to maximize her success with intensive lifestyle modifications for her multiple health conditions.  Attestation Statement   Reviewed by clinician on day of visit: allergies, medications, problem list, medical history, surgical history, family history, social history, and previous encounter notes.     Lucas Francyne, MD  "

## 2024-05-10 NOTE — Assessment & Plan Note (Signed)
 Blood pressure is well-controlled off labetalol.  Blood pressure control has improved with her weight loss.  We are increasing tirzepatide  which would help lower blood pressure as well.  We need to monitor off medication Orders:   Tirzepatide -Weight Management 12.5 MG/0.5ML SOLN; Inject 12.5 mg into the skin once a week.

## 2024-06-07 ENCOUNTER — Ambulatory Visit (INDEPENDENT_AMBULATORY_CARE_PROVIDER_SITE_OTHER): Payer: Self-pay | Admitting: Internal Medicine

## 2024-07-05 ENCOUNTER — Ambulatory Visit (INDEPENDENT_AMBULATORY_CARE_PROVIDER_SITE_OTHER): Admitting: Internal Medicine
